# Patient Record
Sex: Female | Born: 1980 | Race: White | Hispanic: No | Marital: Married | Smoking: Never smoker
Health system: Southern US, Community
[De-identification: ages and names within clinical notes are randomized; demographics above are authoritative.]

## PROBLEM LIST (undated history)

## (undated) ENCOUNTER — Inpatient Hospital Stay (HOSPITAL_COMMUNITY): Payer: Self-pay

## (undated) DIAGNOSIS — Z87442 Personal history of urinary calculi: Secondary | ICD-10-CM

## (undated) DIAGNOSIS — F32A Depression, unspecified: Secondary | ICD-10-CM

## (undated) DIAGNOSIS — F419 Anxiety disorder, unspecified: Secondary | ICD-10-CM

## (undated) DIAGNOSIS — I10 Essential (primary) hypertension: Secondary | ICD-10-CM

## (undated) DIAGNOSIS — E785 Hyperlipidemia, unspecified: Secondary | ICD-10-CM

## (undated) DIAGNOSIS — F329 Major depressive disorder, single episode, unspecified: Secondary | ICD-10-CM

## (undated) HISTORY — PX: WISDOM TOOTH EXTRACTION: SHX21

## (undated) HISTORY — DX: Hyperlipidemia, unspecified: E78.5

## (undated) HISTORY — PX: ABDOMINAL HYSTERECTOMY: SHX81

## (undated) HISTORY — DX: Major depressive disorder, single episode, unspecified: F32.9

## (undated) HISTORY — DX: Depression, unspecified: F32.A

## (undated) HISTORY — PX: COLONOSCOPY: SHX174

## (undated) HISTORY — PX: KNEE ARTHROSCOPY: SUR90

## (undated) HISTORY — DX: Anxiety disorder, unspecified: F41.9

## (undated) HISTORY — PX: TUBAL LIGATION: SHX77

## (undated) HISTORY — PX: UPPER GI ENDOSCOPY: SHX6162

## (undated) HISTORY — PX: COLPOSCOPY VULVA W/ BIOPSY: SUR282

---

## 2008-04-14 HISTORY — PX: CHOLECYSTECTOMY: SHX55

## 2012-05-10 ENCOUNTER — Ambulatory Visit: Payer: Managed Care, Other (non HMO) | Admitting: Gynecology

## 2012-05-10 VITALS — BP 113/84 | Wt 169.0 lb

## 2012-05-10 DIAGNOSIS — R110 Nausea: Secondary | ICD-10-CM

## 2012-05-10 DIAGNOSIS — Z3480 Encounter for supervision of other normal pregnancy, unspecified trimester: Secondary | ICD-10-CM

## 2012-05-10 LAB — POCT URINE PREGNANCY: Preg Test, Ur: POSITIVE

## 2012-05-10 MED ORDER — PROMETHAZINE HCL 25 MG PO TABS: 25.0000 mg | ORAL_TABLET | Freq: Four times a day (QID) | ORAL | Status: DC | PRN

## 2012-05-10 NOTE — Patient Instructions (Addendum)
Patient was seen today for prenatal work -up as a Engineer, civil (consulting) visit. Blood work was drawn and patient medical history was taken. Patient also request to have integrated screening done. This will also discuss further at her next appointment with the doctor on 05/18/12.Patient also request medication sent to her pharmacy for nausea. New Patient package was given and patient doing well. Advise patient if have any further question before appointment she would call office.

## 2012-05-11 LAB — HIV ANTIBODY (ROUTINE TESTING W REFLEX): HIV: NONREACTIVE

## 2012-05-12 LAB — OBSTETRIC PANEL
Basophils Absolute: 0 10*3/uL (ref 0.0–0.1)
Eosinophils Absolute: 0 10*3/uL (ref 0.0–0.7)
Eosinophils Relative: 0 % (ref 0–5)
Hepatitis B Surface Ag: NEGATIVE
Lymphocytes Relative: 35 % (ref 12–46)
Lymphs Abs: 2.3 10*3/uL (ref 0.7–4.0)
MCH: 28 pg (ref 26.0–34.0)
MCV: 82.5 fL (ref 78.0–100.0)
Neutrophils Relative %: 55 % (ref 43–77)
Platelets: 276 10*3/uL (ref 150–400)
RBC: 4.75 MIL/uL (ref 3.87–5.11)
RDW: 13.4 % (ref 11.5–15.5)
WBC: 6.4 10*3/uL (ref 4.0–10.5)

## 2012-05-13 ENCOUNTER — Encounter: Payer: Self-pay | Admitting: Obstetrics and Gynecology

## 2012-05-13 ENCOUNTER — Encounter: Payer: Self-pay | Admitting: Family Medicine

## 2012-05-13 ENCOUNTER — Ambulatory Visit (INDEPENDENT_AMBULATORY_CARE_PROVIDER_SITE_OTHER): Payer: Private Health Insurance - Indemnity | Admitting: Obstetrics and Gynecology

## 2012-05-13 VITALS — BP 128/86 | Ht 63.0 in | Wt 170.0 lb

## 2012-05-13 DIAGNOSIS — N390 Urinary tract infection, site not specified: Secondary | ICD-10-CM

## 2012-05-13 DIAGNOSIS — B951 Streptococcus, group B, as the cause of diseases classified elsewhere: Secondary | ICD-10-CM

## 2012-05-13 DIAGNOSIS — O2340 Unspecified infection of urinary tract in pregnancy, unspecified trimester: Secondary | ICD-10-CM

## 2012-05-13 DIAGNOSIS — O2390 Unspecified genitourinary tract infection in pregnancy, unspecified trimester: Secondary | ICD-10-CM

## 2012-05-13 DIAGNOSIS — Z3480 Encounter for supervision of other normal pregnancy, unspecified trimester: Secondary | ICD-10-CM

## 2012-05-13 DIAGNOSIS — O09299 Supervision of pregnancy with other poor reproductive or obstetric history, unspecified trimester: Secondary | ICD-10-CM

## 2012-05-13 DIAGNOSIS — Z98891 History of uterine scar from previous surgery: Secondary | ICD-10-CM

## 2012-05-13 DIAGNOSIS — Z9889 Other specified postprocedural states: Secondary | ICD-10-CM

## 2012-05-13 LAB — CULTURE, URINE COMPREHENSIVE: Colony Count: 40000

## 2012-05-13 MED ORDER — PENICILLIN V POTASSIUM 500 MG PO TABS: 500.0000 mg | ORAL_TABLET | Freq: Four times a day (QID) | ORAL | Status: DC

## 2012-05-13 MED ORDER — ONDANSETRON 4 MG PO TBDP: 4.0000 mg | ORAL_TABLET | Freq: Four times a day (QID) | ORAL | Status: DC | PRN

## 2012-05-13 NOTE — Patient Instructions (Signed)
Pregnancy - First Trimester During sexual intercourse, millions of sperm go into the vagina. Only 1 sperm will penetrate and fertilize the female egg while it is in the Fallopian tube. One week later, the fertilized egg implants into the wall of the uterus. An embryo begins to develop into a baby. At 6 to 8 weeks, the eyes and face are formed and the heartbeat can be seen on ultrasound. At the end of 12 weeks (first trimester), all the baby's organs are formed. Now that you are pregnant, you will want to do everything you can to have a healthy baby. Two of the most important things are to get good prenatal care and follow your caregiver's instructions. Prenatal care is all the medical care you receive before the baby's birth. It is given to prevent, find, and treat problems during the pregnancy and childbirth. PRENATAL EXAMS  During prenatal visits, your weight, blood pressure and urine are checked. This is done to make sure you are healthy and progressing normally during the pregnancy.  A pregnant woman should gain 25 to 35 pounds during the pregnancy. However, if you are over weight or underweight, your caregiver will advise you regarding your weight.  Your caregiver will ask and answer questions for you.  Blood work, cervical cultures, other necessary tests and a Pap test are done during your prenatal exams. These tests are done to check on your health and the probable health of your baby. Tests are strongly recommended and done for HIV with your permission. This is the virus that causes AIDS. These tests are done because medications can be given to help prevent your baby from being born with this infection should you have been infected without knowing it. Blood work is also used to find out your blood type, previous infections and follow your blood levels (hemoglobin).  Low hemoglobin (anemia) is common during pregnancy. Iron and vitamins are given to help prevent this. Later in the pregnancy,  blood tests for diabetes will be done along with any other tests if any problems develop. You may need tests to make sure you and the baby are doing well.  You may need other tests to make sure you and the baby are doing well. CHANGES DURING THE FIRST TRIMESTER (THE FIRST 3 MONTHS OF PREGNANCY) Your body goes through many changes during pregnancy. They vary from person to person. Talk to your caregiver about changes you notice and are concerned about. Changes can include:  Your menstrual period stops.  The egg and sperm carry the genes that determine what you look like. Genes from you and your partner are forming a baby. The female genes determine whether the baby is a boy or a girl.  Your body increases in girth and you may feel bloated.  Feeling sick to your stomach (nauseous) and throwing up (vomiting). If the vomiting is uncontrollable, call your caregiver.  Your breasts will begin to enlarge and become tender.  Your nipples may stick out more and become darker.  The need to urinate more. Painful urination may mean you have a bladder infection.  Tiring easily.  Loss of appetite.  Cravings for certain kinds of food.  At first, you may gain or lose a couple of pounds.  You may have changes in your emotions from day to day (excited to be pregnant or concerned something may go wrong with the pregnancy and baby).  You may have more vivid and strange dreams. HOME CARE INSTRUCTIONS   It is very important   to avoid all smoking, alcohol and un-prescribed drugs during your pregnancy. These affect the formation and growth of the baby. Avoid chemicals while pregnant to ensure the delivery of a healthy infant.  Start your prenatal visits by the 12th week of pregnancy. They are usually scheduled monthly at first, then more often in the last 2 months before delivery. Keep your caregiver's appointments. Follow your caregiver's instructions regarding medication use, blood and lab tests, exercise,  and diet.  During pregnancy, you are providing food for you and your baby. Eat regular, well-balanced meals. Choose foods such as meat, fish, milk and other low fat dairy products, vegetables, fruits, and whole-grain breads and cereals. Your caregiver will tell you of the ideal weight gain.  You can help morning sickness by keeping soda crackers at the bedside. Eat a couple before arising in the morning. You may want to use the crackers without salt on them.  Eating 4 to 5 small meals rather than 3 large meals a day also may help the nausea and vomiting.  Drinking liquids between meals instead of during meals also seems to help nausea and vomiting.  A physical sexual relationship may be continued throughout pregnancy if there are no other problems. Problems may be early (premature) leaking of amniotic fluid from the membranes, vaginal bleeding, or belly (abdominal) pain.  Exercise regularly if there are no restrictions. Check with your caregiver or physical therapist if you are unsure of the safety of some of your exercises. Greater weight gain will occur in the last 2 trimesters of pregnancy. Exercising will help:  Control your weight.  Keep you in shape.  Prepare you for labor and delivery.  Help you lose your pregnancy weight after you deliver your baby.  Wear a good support or jogging bra for breast tenderness during pregnancy. This may help if worn during sleep too.  Ask when prenatal classes are available. Begin classes when they are offered.  Do not use hot tubs, steam rooms or saunas.  Wear your seat belt when driving. This protects you and your baby if you are in an accident.  Avoid raw meat, uncooked cheese, cat litter boxes and soil used by cats throughout the pregnancy. These carry germs that can cause birth defects in the baby.  The first trimester is a good time to visit your dentist for your dental health. Getting your teeth cleaned is OK. Use a softer toothbrush and  brush gently during pregnancy.  Ask for help if you have financial, counseling or nutritional needs during pregnancy. Your caregiver will be able to offer counseling for these needs as well as refer you for other special needs.  Do not take any medications or herbs unless told by your caregiver.  Inform your caregiver if there is any mental or physical domestic violence.  Make a list of emergency phone numbers of family, friends, hospital, and police and fire departments.  Write down your questions. Take them to your prenatal visit.  Do not douche.  Do not cross your legs.  If you have to stand for long periods of time, rotate you feet or take small steps in a circle.  You may have more vaginal secretions that may require a sanitary pad. Do not use tampons or scented sanitary pads. MEDICATIONS AND DRUG USE IN PREGNANCY  Take prenatal vitamins as directed. The vitamin should contain 1 milligram of folic acid. Keep all vitamins out of reach of children. Only a couple vitamins or tablets containing iron may be   fatal to a baby or young child when ingested.  Avoid use of all medications, including herbs, over-the-counter medications, not prescribed or suggested by your caregiver. Only take over-the-counter or prescription medicines for pain, discomfort, or fever as directed by your caregiver. Do not use aspirin, ibuprofen, or naproxen unless directed by your caregiver.  Let your caregiver also know about herbs you may be using.  Alcohol is related to a number of birth defects. This includes fetal alcohol syndrome. All alcohol, in any form, should be avoided completely. Smoking will cause low birth rate and premature babies.  Street or illegal drugs are very harmful to the baby. They are absolutely forbidden. A baby born to an addicted mother will be addicted at birth. The baby will go through the same withdrawal an adult does.  Let your caregiver know about any medications that you have to  take and for what reason you take them. MISCARRIAGE IS COMMON DURING PREGNANCY A miscarriage does not mean you did something wrong. It is not a reason to worry about getting pregnant again. Your caregiver will help you with questions you may have. If you have a miscarriage, you may need minor surgery. SEEK MEDICAL CARE IF:  You have any concerns or worries during your pregnancy. It is better to call with your questions if you feel they cannot wait, rather than worry about them. SEEK IMMEDIATE MEDICAL CARE IF:   An unexplained oral temperature above 100.4 F (38 C) develops, or as your caregiver suggests.  You have leaking of fluid from the vagina (birth canal). If leaking membranes are suspected, take your temperature and inform your caregiver of this when you call.  There is vaginal spotting or bleeding. Notify your caregiver of the amount and how many pads are used.  You develop a bad smelling vaginal discharge with a change in the color.  You continue to feel sick to your stomach (nauseated) and have no relief from remedies suggested. You vomit blood or coffee ground-like materials.  You lose more than 2 pounds of weight in 1 week.  You gain more than 2 pounds of weight in 1 week and you notice swelling of your face, hands, feet, or legs.  You gain 5 pounds or more in 1 week (even if you do not have swelling of your hands, face, legs, or feet).  You get exposed to German measles and have never had them.  You are exposed to fifth disease or chickenpox.  You develop belly (abdominal) pain. Round ligament discomfort is a common non-cancerous (benign) cause of abdominal pain in pregnancy. Your caregiver still must evaluate this.  You develop headache, fever, diarrhea, pain with urination, or shortness of breath.  You fall or are in a car accident or have any kind of trauma.  There is mental or physical violence in your home. Document Released: 03/25/2001 Document Revised: 06/23/2011  Document Reviewed: 09/26/2008 [employer] Patient Information 2013 [employer].  

## 2012-05-13 NOTE — Progress Notes (Signed)
Last pap done in March of 2013, had implanon removed in April.

## 2012-05-13 NOTE — Addendum Note (Signed)
Addended by: Barbara Cower on: 05/13/2012 11:55 AM   Modules accepted: Orders

## 2012-05-13 NOTE — Progress Notes (Signed)
   Subjective:    Alisha Hughes is a G2P0101 [redacted]w[redacted]d being seen today for her first obstetrical visit.  Her obstetrical history is significant for group B strep colonizer and pre-eclampsia. Patient  Had a 4-day induction due to pre-clampsia which resulted in a c-section secondary to failed induction. Patient does intend to breast feed. Pregnancy history fully reviewed.  Patient reports nausea.  Filed Vitals:   05/13/12 1000 05/13/12 1004  BP: 128/86   Height:  5\' 3"  (1.6 m)  Weight: 170 lb (77.111 kg)     HISTORY: OB History    Grav Para Term Preterm Abortions TAB SAB Ect Mult Living   2 1 0 1      1     # Outc Date GA Lbr Len/2nd Wgt Sex Del Anes PTL Lv   1 PRE 8/11 [redacted]w[redacted]d   F LTCS EPI  Yes   2 CUR              Past Medical History  Diagnosis Date  . Anxiety   . Pregnancy induced hypertension   . Abnormal Pap smear   . Depression   . Preterm labor   . Headache     migranes.  . Ovarian cyst    Past Surgical History  Procedure Date  . Colposcopy vulva w/ biopsy     abnormal pap  . Cesarean section   . Colonoscopy     2009  . Cholecystectomy     2009  . Wisdom tooth extraction     x4   Family History  Problem Relation Age of Onset  . Hypertension Mother   . Heart disease Father   . Hypertension Father   . Hyperlipidemia Father   . Depression Brother   . Multiple sclerosis Maternal Grandmother   . Heart disease Maternal Grandfather   . Heart disease Paternal Grandmother   . Parkinson's disease Paternal Grandfather      Exam    Uterus:     Pelvic Exam:    Perineum: Normal Perineum   Vulva: normal   Vagina:  normal mucosa, normal discharge   pH:    Cervix: closed and long   Adnexa: no mass, fullness, tenderness   Bony Pelvis: android  System: Breast:  normal appearance, no masses or tenderness   Skin: normal coloration and turgor, no rashes    Neurologic: oriented, grossly non-focal   Extremities: normal strength, tone, and muscle mass, no erythema,  induration, or nodules   HEENT extra ocular movement intact   Mouth/Teeth mucous membranes moist, pharynx normal without lesions and dental hygiene good   Neck supple and no masses   Cardiovascular: regular rate and rhythm   Respiratory:  chest clear, no wheezing, crepitations, rhonchi, normal symmetric air entry   Abdomen: soft, non-tender; bowel sounds normal; no masses,  no organomegaly   Urinary:       Assessment:    Pregnancy: [id number] Patient Active Problem List  Diagnosis  . Supervision of other normal pregnancy  . GBS (group B streptococcus) UTI complicating pregnancy        Plan:     Initial labs drawn. Prenatal vitamins. Problem list reviewed and updated. Genetic Screening discussed First Screen: requested.  Ultrasound discussed; fetal survey: requested. Reviewed healthy eating and exercise during pregnancy  Follow up in 4 weeks. 50% of 30 min visit spent on counseling and coordination of care.     Peter Daquila 05/13/2012

## 2012-05-14 ENCOUNTER — Encounter: Payer: Self-pay | Admitting: Obstetrics & Gynecology

## 2012-05-14 LAB — GC/CHLAMYDIA PROBE AMP, GENITAL
Chlamydia, DNA Probe: NEGATIVE
GC Probe Amp, Genital: NEGATIVE

## 2012-05-18 ENCOUNTER — Encounter: Payer: Self-pay | Admitting: Obstetrics & Gynecology

## 2012-05-28 ENCOUNTER — Emergency Department: Payer: Self-pay | Admitting: Emergency Medicine

## 2012-05-28 LAB — CBC
HCT: 39.8 % (ref 35.0–47.0)
HGB: 13.3 g/dL (ref 12.0–16.0)
MCH: 28.2 pg (ref 26.0–34.0)
MCV: 84 fL (ref 80–100)
RBC: 4.72 10*6/uL (ref 3.80–5.20)
RDW: 13.8 % (ref 11.5–14.5)
WBC: 9.2 10*3/uL (ref 3.6–11.0)

## 2012-05-28 LAB — COMPREHENSIVE METABOLIC PANEL
Alkaline Phosphatase: 82 U/L (ref 50–136)
Anion Gap: 7 (ref 7–16)
Bilirubin,Total: 0.2 mg/dL (ref 0.2–1.0)
Creatinine: 0.45 mg/dL — ABNORMAL LOW (ref 0.60–1.30)
EGFR (African American): >60
EGFR (Non-African Amer.): >60
Potassium: 3.7 mmol/L (ref 3.5–5.1)
Sodium: 137 mmol/L (ref 136–145)
Total Protein: 7.5 g/dL (ref 6.4–8.2)

## 2012-05-28 LAB — HCG, QUANTITATIVE, PREGNANCY: Beta Hcg, Quant.: 101174 m[IU]/mL — ABNORMAL HIGH

## 2012-05-28 LAB — URINALYSIS, COMPLETE
Glucose,UR: NEGATIVE mg/dL (ref 0–75)
Specific Gravity: 1.015 (ref 1.003–1.030)

## 2012-05-28 LAB — LIPASE, BLOOD: Lipase: 146 U/L (ref 73–393)

## 2012-05-29 LAB — WET PREP, GENITAL

## 2012-05-30 LAB — URINE CULTURE

## 2012-05-31 ENCOUNTER — Ambulatory Visit (HOSPITAL_COMMUNITY): Payer: Private Health Insurance - Indemnity

## 2012-06-08 ENCOUNTER — Ambulatory Visit (INDEPENDENT_AMBULATORY_CARE_PROVIDER_SITE_OTHER): Payer: Private Health Insurance - Indemnity | Admitting: Family Medicine

## 2012-06-08 VITALS — BP 122/89 | Wt 171.0 lb

## 2012-06-08 DIAGNOSIS — Z9889 Other specified postprocedural states: Secondary | ICD-10-CM

## 2012-06-08 DIAGNOSIS — O26899 Other specified pregnancy related conditions, unspecified trimester: Secondary | ICD-10-CM

## 2012-06-08 DIAGNOSIS — R300 Dysuria: Secondary | ICD-10-CM

## 2012-06-08 DIAGNOSIS — O26891 Other specified pregnancy related conditions, first trimester: Secondary | ICD-10-CM

## 2012-06-08 DIAGNOSIS — Z98891 History of uterine scar from previous surgery: Secondary | ICD-10-CM

## 2012-06-08 DIAGNOSIS — Z3480 Encounter for supervision of other normal pregnancy, unspecified trimester: Secondary | ICD-10-CM

## 2012-06-08 LAB — POCT URINALYSIS DIPSTICK
Leukocytes, UA: NEGATIVE
Protein, UA: NEGATIVE
Spec Grav, UA: >=1.03
pH, UA: 5

## 2012-06-08 MED ORDER — CEPHALEXIN 500 MG PO CAPS: 500.0000 mg | ORAL_CAPSULE | Freq: Three times a day (TID) | ORAL | Status: DC

## 2012-06-08 NOTE — Patient Instructions (Addendum)
Dysuria Dysuria is the medical term for pain with urination. There are many causes for dysuria, but urinary tract infection is the most common. If a urinalysis was performed it can show that there is a urinary tract infection. A urine culture confirms that you or your child is sick. You will need to follow up with a healthcare provider because:  If a urine culture was done you will need to know the culture results and treatment recommendations.  If the urine culture was positive, you or your child will need to be put on antibiotics or know if the antibiotics prescribed are the right antibiotics for your urinary tract infection.  If the urine culture is negative (no urinary tract infection), then other causes may need to be explored or antibiotics need to be stopped. Today laboratory work may have been done and there does not seem to be an infection. If cultures were done they will take at least 24 to 48 hours to be completed. Today x-rays may have been taken and they read as normal. No cause can be found for the problems. The x-rays may be re-read by a radiologist and you will be contacted if additional findings are made. You or your child may have been put on medications to help with this problem until you can see your primary caregiver. If the problems get better, see your primary caregiver if the problems return. If you were given antibiotics (medications which kill germs), take all of the mediations as directed for the full course of treatment.  If laboratory work was done, you need to find the results. Leave a telephone number where you can be reached. If this is not possible, make sure you find out how you are to get test results. HOME CARE INSTRUCTIONS   Drink lots of fluids. For adults, drink eight, 8 ounce glasses of clear juice or water a day. For children, replace fluids as suggested by your caregiver.  Empty the bladder often. Avoid holding urine for long periods of time.  After a bowel  movement, women should cleanse front to back, using each tissue only once.  Empty your bladder before and after sexual intercourse.  Take all the medicine given to you until it is gone. You may feel better in a few days, but TAKE ALL MEDICINE.  Avoid caffeine, tea, alcohol and carbonated beverages, because they tend to irritate the bladder.  In men, alcohol may irritate the prostate.  Only take over-the-counter or prescription medicines for pain, discomfort, or fever as directed by your caregiver.  If your caregiver has given you a follow-up appointment, it is very important to keep that appointment. Not keeping the appointment could result in a chronic or permanent injury, pain, and disability. If there is any problem keeping the appointment, you must call back to this facility for assistance. SEEK IMMEDIATE MEDICAL CARE IF:   Back pain develops.  A fever develops.  There is nausea (feeling sick to your stomach) or vomiting (throwing up).  Problems are no better with medications or are getting worse. MAKE SURE YOU:   Understand these instructions.  Will watch your condition.  Will get help right away if you are not doing well or get worse. Document Released: 12/28/2003 Document Revised: 06/23/2011 Document Reviewed: 11/04/2007 [facility] Patient Information 2013 [place], [place].  Pregnancy - First Trimester During sexual intercourse, millions of sperm go into the vagina. Only 1 sperm will penetrate and fertilize the female egg while it is in the Fallopian tube. One  week later, the fertilized egg implants into the wall of the uterus. An embryo begins to develop into a baby. At 6 to 8 weeks, the eyes and face are formed and the heartbeat can be seen on ultrasound. At the end of 12 weeks (first trimester), all the baby's organs are formed. Now that you are pregnant, you will want to do everything you can to have a healthy baby. Two of the most important things are to get good prenatal  care and follow your caregiver's instructions. Prenatal care is all the medical care you receive before the baby's birth. It is given to prevent, find, and treat problems during the pregnancy and childbirth. PRENATAL EXAMS  During prenatal visits, your weight, blood pressure and urine are checked. This is done to make sure you are healthy and progressing normally during the pregnancy.  A pregnant woman should gain 25 to 35 pounds during the pregnancy. However, if you are over weight or underweight, your caregiver will advise you regarding your weight.  Your caregiver will ask and answer questions for you.  Blood work, cervical cultures, other necessary tests and a Pap test are done during your prenatal exams. These tests are done to check on your health and the probable health of your baby. Tests are strongly recommended and done for HIV with your permission. This is the virus that causes AIDS. These tests are done because medications can be given to help prevent your baby from being born with this infection should you have been infected without knowing it. Blood work is also used to find out your blood type, previous infections and follow your blood levels (hemoglobin).  Low hemoglobin (anemia) is common during pregnancy. Iron and vitamins are given to help prevent this. Later in the pregnancy, blood tests for diabetes will be done along with any other tests if any problems develop. You may need tests to make sure you and the baby are doing well.  You may need other tests to make sure you and the baby are doing well. CHANGES DURING THE FIRST TRIMESTER (THE FIRST 3 MONTHS OF PREGNANCY) Your body goes through many changes during pregnancy. They vary from person to person. Talk to your caregiver about changes you notice and are concerned about. Changes can include:  Your menstrual period stops.  The egg and sperm carry the genes that determine what you look like. Genes from you and your partner are  forming a baby. The female genes determine whether the baby is a boy or a girl.  Your body increases in girth and you may feel bloated.  Feeling sick to your stomach (nauseous) and throwing up (vomiting). If the vomiting is uncontrollable, call your caregiver.  Your breasts will begin to enlarge and become tender.  Your nipples may stick out more and become darker.  The need to urinate more. Painful urination may mean you have a bladder infection.  Tiring easily.  Loss of appetite.  Cravings for certain kinds of food.  At first, you may gain or lose a couple of pounds.  You may have changes in your emotions from day to day (excited to be pregnant or concerned something may go wrong with the pregnancy and baby).  You may have more vivid and strange dreams. HOME CARE INSTRUCTIONS   It is very important to avoid all smoking, alcohol and un-prescribed drugs during your pregnancy. These affect the formation and growth of the baby. Avoid chemicals while pregnant to ensure the delivery of a healthy  infant.  Start your prenatal visits by the 12th week of pregnancy. They are usually scheduled monthly at first, then more often in the last 2 months before delivery. Keep your caregiver's appointments. Follow your caregiver's instructions regarding medication use, blood and lab tests, exercise, and diet.  During pregnancy, you are providing food for you and your baby. Eat regular, well-balanced meals. Choose foods such as meat, fish, milk and other low fat dairy products, vegetables, fruits, and whole-grain breads and cereals. Your caregiver will tell you of the ideal weight gain.  You can help morning sickness by keeping soda crackers at the bedside. Eat a couple before arising in the morning. You may want to use the crackers without salt on them.  Eating 4 to 5 small meals rather than 3 large meals a day also may help the nausea and vomiting.  Drinking liquids between meals instead of during  meals also seems to help nausea and vomiting.  A physical sexual relationship may be continued throughout pregnancy if there are no other problems. Problems may be early (premature) leaking of amniotic fluid from the membranes, vaginal bleeding, or belly (abdominal) pain.  Exercise regularly if there are no restrictions. Check with your caregiver or physical therapist if you are unsure of the safety of some of your exercises. Greater weight gain will occur in the last 2 trimesters of pregnancy. Exercising will help:  Control your weight.  Keep you in shape.  Prepare you for labor and delivery.  Help you lose your pregnancy weight after you deliver your baby.  Wear a good support or jogging bra for breast tenderness during pregnancy. This may help if worn during sleep too.  Ask when prenatal classes are available. Begin classes when they are offered.  Do not use hot tubs, steam rooms or saunas.  Wear your seat belt when driving. This protects you and your baby if you are in an accident.  Avoid raw meat, uncooked cheese, cat litter boxes and soil used by cats throughout the pregnancy. These carry germs that can cause birth defects in the baby.  The first trimester is a good time to visit your dentist for your dental health. Getting your teeth cleaned is OK. Use a softer toothbrush and brush gently during pregnancy.  Ask for help if you have financial, counseling or nutritional needs during pregnancy. Your caregiver will be able to offer counseling for these needs as well as refer you for other special needs.  Do not take any medications or herbs unless told by your caregiver.  Inform your caregiver if there is any mental or physical domestic violence.  Make a list of emergency phone numbers of family, friends, hospital, and police and fire departments.  Write down your questions. Take them to your prenatal visit.  Do not douche.  Do not cross your legs.  If you have to stand for  long periods of time, rotate you feet or take small steps in a circle.  You may have more vaginal secretions that may require a sanitary pad. Do not use tampons or scented sanitary pads. MEDICATIONS AND DRUG USE IN PREGNANCY  Take prenatal vitamins as directed. The vitamin should contain 1 milligram of folic acid. Keep all vitamins out of reach of children. Only a couple vitamins or tablets containing iron may be fatal to a baby or young child when ingested.  Avoid use of all medications, including herbs, over-the-counter medications, not prescribed or suggested by your caregiver. Only take over-the-counter or prescription  medicines for pain, discomfort, or fever as directed by your caregiver. Do not use aspirin, ibuprofen, or naproxen unless directed by your caregiver.  Let your caregiver also know about herbs you may be using.  Alcohol is related to a number of birth defects. This includes fetal alcohol syndrome. All alcohol, in any form, should be avoided completely. Smoking will cause low birth rate and premature babies.  Street or illegal drugs are very harmful to the baby. They are absolutely forbidden. A baby born to an addicted mother will be addicted at birth. The baby will go through the same withdrawal an adult does.  Let your caregiver know about any medications that you have to take and for what reason you take them. MISCARRIAGE IS COMMON DURING PREGNANCY A miscarriage does not mean you did something wrong. It is not a reason to worry about getting pregnant again. Your caregiver will help you with questions you may have. If you have a miscarriage, you may need minor surgery. SEEK MEDICAL CARE IF:  You have any concerns or worries during your pregnancy. It is better to call with your questions if you feel they cannot wait, rather than worry about them. SEEK IMMEDIATE MEDICAL CARE IF:   An unexplained oral temperature above 102 F (38.9 C) develops, or as your caregiver  suggests.  You have leaking of fluid from the vagina (birth canal). If leaking membranes are suspected, take your temperature and inform your caregiver of this when you call.  There is vaginal spotting or bleeding. Notify your caregiver of the amount and how many pads are used.  You develop a bad smelling vaginal discharge with a change in the color.  You continue to feel sick to your stomach (nauseated) and have no relief from remedies suggested. You vomit blood or coffee ground-like materials.  You lose more than 2 pounds of weight in 1 week.  You gain more than 2 pounds of weight in 1 week and you notice swelling of your face, hands, feet, or legs.  You gain 5 pounds or more in 1 week (even if you do not have swelling of your hands, face, legs, or feet).  You get exposed to [place] measles and have never had them.  You are exposed to fifth disease or chickenpox.  You develop belly (abdominal) pain. Round ligament discomfort is a common non-cancerous (benign) cause of abdominal pain in pregnancy. Your caregiver still must evaluate this.  You develop headache, fever, diarrhea, pain with urination, or shortness of breath.  You fall or are in a car accident or have any kind of trauma.  There is mental or physical violence in your home. Document Released: 03/25/2001 Document Revised: 06/23/2011 Document Reviewed: 09/26/2008 [facility] Patient Information 2013 [place], [place].  Breastfeeding Deciding to breastfeed is one of the best choices you can make for you and your baby. The information that follows gives a brief overview of the benefits of breastfeeding as well as common topics surrounding breastfeeding. BENEFITS OF BREASTFEEDING For the baby  The first milk (colostrum) helps the baby's digestive system function better.   There are antibodies in the mother's milk that help the baby fight off infections.   The baby has a lower incidence of asthma, allergies, and sudden infant  death syndrome (SIDS).   The nutrients in breast milk are better for the baby than infant formulas, and breast milk helps the baby's brain grow better.   Babies who breastfeed have less gas, colic, and constipation.  For the  mother  Breastfeeding helps develop a very special bond between the mother and her baby.   Breastfeeding is convenient, always available at the correct temperature, and costs nothing.   Breastfeeding burns calories in the mother and helps her lose weight that was gained during pregnancy.   Breastfeeding makes the uterus contract back down to normal size faster and slows bleeding following delivery.   Breastfeeding mothers have a lower risk of developing breast cancer.  BREASTFEEDING FREQUENCY  A healthy, full-term baby may breastfeed as often as every hour or space his or her feedings to every 3 hours.   Watch your baby for signs of hunger. Nurse your baby if he or she shows signs of hunger. How often you nurse will vary from baby to baby.   Nurse as often as the baby requests, or when you feel the need to reduce the fullness of your breasts.   Awaken the baby if it has been 3 4 hours since the last feeding.   Frequent feeding will help the mother make more milk and will help prevent problems, such as sore nipples and engorgement of the breasts.  BABY'S POSITION AT THE BREAST  Whether lying down or sitting, be sure that the baby's tummy is facing your tummy.   Support the breast with 4 fingers underneath the breast and the thumb above. Make sure your fingers are well away from the nipple and baby's mouth.   Stroke the baby's lips gently with your finger or nipple.   When the baby's mouth is open wide enough, place all of your nipple and as much of the areola as possible into your baby's mouth.   Pull the baby in close so the tip of the nose and the baby's cheeks touch the breast during the feeding.  FEEDINGS AND SUCTION  The length of  each feeding varies from baby to baby and from feeding to feeding.   The baby must suck about 2 3 minutes for your milk to get to him or her. This is called a "let down." For this reason, allow the baby to feed on each breast as long as he or she wants. Your baby will end the feeding when he or she has received the right balance of nutrients.   To break the suction, put your finger into the corner of the baby's mouth and slide it between his or her gums before removing your breast from his or her mouth. This will help prevent sore nipples.  HOW TO TELL WHETHER YOUR BABY IS GETTING ENOUGH BREAST MILK. Wondering whether or not your baby is getting enough milk is a common concern among mothers. You can be assured that your baby is getting enough milk if:   Your baby is actively sucking and you hear swallowing.   Your baby seems relaxed and satisfied after a feeding.   Your baby nurses at least 8 12 times in a 24 hour time period. Nurse your baby until he or she unlatches or falls asleep at the first breast (at least 10 20 minutes), then offer the second side.   Your baby is wetting 5 6 disposable diapers (6 8 cloth diapers) in a 24 hour period by 62 71 days of age.   Your baby is having at least 3 4 stools every 24 hours for the first 6 weeks. The stool should be soft and yellow.   Your baby should gain 4 7 ounces per week after he or she is 80 days old.  Your breasts feel softer after nursing.  REDUCING BREAST ENGORGEMENT  In the first week after your baby is born, you may experience signs of breast engorgement. When breasts are engorged, they feel heavy, warm, full, and may be tender to the touch. You can reduce engorgement if you:   Nurse frequently, every 2 3 hours. Mothers who breastfeed early and often have fewer problems with engorgement.   Place light ice packs on your breasts for 10 20 minutes between feedings. This reduces swelling. Wrap the ice packs in a lightweight  towel to protect your skin. Bags of frozen vegetables work well for this purpose.   Take a warm shower or apply warm, moist heat to your breast for 5 10 minutes just before each feeding. This increases circulation and helps the milk flow.   Gently massage your breast before and during the feeding. Using your finger tips, massage from the chest wall towards your nipple in a circular motion.   Make sure that the baby empties at least one breast at every feeding before switching sides.   Use a breast pump to empty the breasts if your baby is sleepy or not nursing well. You may also want to pump if you are returning to work oryou feel you are getting engorged.   Avoid bottle feeds, pacifiers, or supplemental feedings of water or juice in place of breastfeeding. Breast milk is all the food your baby needs. It is not necessary for your baby to have water or formula. In fact, to help your breasts make more milk, it is best not to give your baby supplemental feedings during the early weeks.   Be sure the baby is latched on and positioned properly while breastfeeding.   Wear a supportive bra, avoiding underwire styles.   Eat a balanced diet with enough fluids.   Rest often, relax, and take your prenatal vitamins to prevent fatigue, stress, and anemia.  If you follow these suggestions, your engorgement should improve in 24 48 hours. If you are still experiencing difficulty, call your lactation consultant or caregiver.  CARING FOR YOURSELF Take care of your breasts  Bathe or shower daily.   Avoid using soap on your nipples.   Start feedings on your left breast at one feeding and on your right breast at the next feeding.   You will notice an increase in your milk supply 2 5 days after delivery. You may feel some discomfort from engorgement, which makes your breasts very firm and often tender. Engorgement "peaks" out within 24 48 hours. In the meantime, apply warm moist towels to your  breasts for 5 10 minutes before feeding. Gentle massage and expression of some milk before feeding will soften your breasts, making it easier for your baby to latch on.   Wear a well-fitting nursing bra, and air dry your nipples for a 3 after each feeding.   Only use cotton bra pads.   Only use pure lanolin on your nipples after nursing. You do not need to wash it off before feeding the baby again. Another option is to express a few drops of breast milk and gently massage it into your nipples.  Take care of yourself  Eat well-balanced meals and nutritious snacks.   Drinking milk, fruit juice, and water to satisfy your thirst (about 8 glasses a day).   Get plenty of rest.  Avoid foods that you notice affect the baby in a bad way.  SEEK MEDICAL CARE IF:   You have  difficulty with breastfeeding and need help.   You have a hard, red, sore area on your breast that is accompanied by a fever.   Your baby is too sleepy to eat well or is having trouble sleeping.   Your baby is wetting less than 6 diapers a day, by 68 days of age.   Your baby's skin or white part of his or her eyes is more yellow than it was in the hospital.   You feel depressed.  Document Released: 03/31/2005 Document Revised: 09/30/2011 Document Reviewed: 06/29/2011 [facility] Patient Information 2013 [place], [place].

## 2012-06-08 NOTE — Progress Notes (Signed)
Mild dysuria, hesitancy--just treated for UTI with Macrobid--U/A shows more blood and leuks will re-treat. Now with diarrhea--using gatorade and water to help with dehydration

## 2012-06-08 NOTE — Progress Notes (Signed)
Having cramping x 2 weeks.  Went to [facility] on 05/28/12 and was treated for UTI and dehydration, did have cramping during this time but the cramping is continuing.

## 2012-06-10 ENCOUNTER — Encounter: Payer: Private Health Insurance - Indemnity | Admitting: Obstetrics & Gynecology

## 2012-06-19 ENCOUNTER — Encounter (HOSPITAL_COMMUNITY): Payer: Self-pay | Admitting: *Deleted

## 2012-06-19 ENCOUNTER — Emergency Department (HOSPITAL_COMMUNITY)
Admission: EM | Admit: 2012-06-19 | Discharge: 2012-06-19 | Discharge status: Against Medical Advice | Payer: Private Health Insurance - Indemnity | Attending: Emergency Medicine | Admitting: Emergency Medicine

## 2012-06-19 DIAGNOSIS — R05 Cough: Secondary | ICD-10-CM | POA: Insufficient documentation

## 2012-06-19 DIAGNOSIS — R197 Diarrhea, unspecified: Secondary | ICD-10-CM | POA: Insufficient documentation

## 2012-06-19 DIAGNOSIS — R059 Cough, unspecified: Secondary | ICD-10-CM | POA: Insufficient documentation

## 2012-06-19 DIAGNOSIS — O219 Vomiting of pregnancy, unspecified: Secondary | ICD-10-CM | POA: Insufficient documentation

## 2012-06-19 LAB — CBC WITH DIFFERENTIAL/PLATELET
Basophils Absolute: 0 10*3/uL (ref 0.0–0.1)
Basophils Relative: 0 % (ref 0–1)
Eosinophils Absolute: 0.1 10*3/uL (ref 0.0–0.7)
HCT: 38.4 % (ref 36.0–46.0)
Hemoglobin: 13.7 g/dL (ref 12.0–15.0)
Lymphs Abs: 2.5 10*3/uL (ref 0.7–4.0)
MCH: 29.1 pg (ref 26.0–34.0)
Monocytes Absolute: 1.3 10*3/uL — ABNORMAL HIGH (ref 0.1–1.0)
Monocytes Relative: 12 % (ref 3–12)
Neutro Abs: 6.3 10*3/uL (ref 1.7–7.7)
Neutrophils Relative %: 62 % (ref 43–77)
RBC: 4.71 MIL/uL (ref 3.87–5.11)

## 2012-06-19 LAB — COMPREHENSIVE METABOLIC PANEL
ALT: 8 U/L (ref 0–35)
AST: 15 U/L (ref 0–37)
Albumin: 3.2 g/dL — ABNORMAL LOW (ref 3.5–5.2)
CO2: 24 mEq/L (ref 19–32)
Calcium: 9.6 mg/dL (ref 8.4–10.5)
Creatinine, Ser: 0.49 mg/dL — ABNORMAL LOW (ref 0.50–1.10)
Sodium: 134 mEq/L — ABNORMAL LOW (ref 135–145)
Total Protein: 7.4 g/dL (ref 6.0–8.3)

## 2012-06-19 LAB — URINE MICROSCOPIC-ADD ON

## 2012-06-19 LAB — URINALYSIS, ROUTINE W REFLEX MICROSCOPIC
Bilirubin Urine: NEGATIVE
Glucose, UA: NEGATIVE mg/dL
Leukocytes, UA: NEGATIVE
Nitrite: NEGATIVE
Specific Gravity, Urine: 1.016 (ref 1.005–1.030)
pH: 6 (ref 5.0–8.0)

## 2012-06-19 NOTE — ED Notes (Addendum)
Pt states she is [redacted] weeks pregnant. Pt states flu like symptoms for the past couple of weeks. Pt states she was seen and treated for a UTI and given fluids due to dehydration. Pt states cough, N/V/D. Pt only able to take Robatussin due to being pregnant and was on 2 antibiotics for UTI. Pt states she has not been able to eat or drink for the past could of days. Pt states vaginal discharge as well but no blood or spotting. Pt took home zofran 2 hours ago.

## 2012-06-25 ENCOUNTER — Other Ambulatory Visit: Payer: Self-pay | Admitting: Obstetrics & Gynecology

## 2012-06-25 DIAGNOSIS — Z3682 Encounter for antenatal screening for nuchal translucency: Secondary | ICD-10-CM

## 2012-06-28 ENCOUNTER — Other Ambulatory Visit: Payer: Self-pay

## 2012-06-28 ENCOUNTER — Ambulatory Visit (HOSPITAL_COMMUNITY)
Admission: RE | Admit: 2012-06-28 | Discharge: 2012-06-28 | Disposition: A | Discharge status: Home / Self Care | Payer: Private Health Insurance - Indemnity | Source: Ambulatory Visit | Attending: Obstetrics & Gynecology | Admitting: Obstetrics & Gynecology

## 2012-06-28 ENCOUNTER — Encounter: Payer: Self-pay | Admitting: Obstetrics & Gynecology

## 2012-06-28 ENCOUNTER — Ambulatory Visit (HOSPITAL_COMMUNITY)
Admission: RE | Admit: 2012-06-28 | Discharge: 2012-06-28 | Disposition: A | Discharge status: Home / Self Care | Payer: Private Health Insurance - Indemnity | Source: Ambulatory Visit | Attending: Obstetrics and Gynecology | Admitting: Obstetrics and Gynecology

## 2012-06-28 VITALS — BP 119/80 | HR 106 | Wt 169.0 lb

## 2012-06-28 DIAGNOSIS — Z3682 Encounter for antenatal screening for nuchal translucency: Secondary | ICD-10-CM

## 2012-06-28 DIAGNOSIS — Z8751 Personal history of pre-term labor: Secondary | ICD-10-CM | POA: Insufficient documentation

## 2012-06-28 DIAGNOSIS — Z3689 Encounter for other specified antenatal screening: Secondary | ICD-10-CM | POA: Insufficient documentation

## 2012-06-28 DIAGNOSIS — O3510X0 Maternal care for (suspected) chromosomal abnormality in fetus, unspecified, not applicable or unspecified: Secondary | ICD-10-CM | POA: Insufficient documentation

## 2012-06-28 DIAGNOSIS — O34219 Maternal care for unspecified type scar from previous cesarean delivery: Secondary | ICD-10-CM | POA: Insufficient documentation

## 2012-06-28 DIAGNOSIS — O351XX0 Maternal care for (suspected) chromosomal abnormality in fetus, not applicable or unspecified: Secondary | ICD-10-CM | POA: Insufficient documentation

## 2012-06-28 DIAGNOSIS — O09299 Supervision of pregnancy with other poor reproductive or obstetric history, unspecified trimester: Secondary | ICD-10-CM | POA: Insufficient documentation

## 2012-06-28 NOTE — Progress Notes (Signed)
Alisha Hughes  was seen today for an ultrasound appointment.  See full report in [facility].  Impression: Single IUP at 13 1/7 weeks NT- 1.7 mm; Nasal bone visualized First trimester aneuploidy screen performed as noted above.    Recommendations: Please do not draw triple/quad screen, though patient should be offered MSAFP for neural tube defect screening.  Recommend ultrasound for fetal anatomy at approximately [redacted] weeks gestation  Alpha Gula, MD

## 2012-06-29 NOTE — Addendum Note (Signed)
Encounter addended by: Ty Hilts, RN on: 06/29/2012  9:53 AM<BR>     Documentation filed: Charges VN, OB Prenatal Vitals, Episodes

## 2012-07-01 ENCOUNTER — Ambulatory Visit (INDEPENDENT_AMBULATORY_CARE_PROVIDER_SITE_OTHER): Payer: Private Health Insurance - Indemnity | Admitting: Obstetrics & Gynecology

## 2012-07-01 VITALS — BP 116/87 | Wt 168.0 lb

## 2012-07-01 DIAGNOSIS — Z98891 History of uterine scar from previous surgery: Secondary | ICD-10-CM

## 2012-07-01 DIAGNOSIS — R3915 Urgency of urination: Secondary | ICD-10-CM

## 2012-07-01 DIAGNOSIS — Z9889 Other specified postprocedural states: Secondary | ICD-10-CM

## 2012-07-01 DIAGNOSIS — Z3482 Encounter for supervision of other normal pregnancy, second trimester: Secondary | ICD-10-CM

## 2012-07-01 DIAGNOSIS — Z3480 Encounter for supervision of other normal pregnancy, unspecified trimester: Secondary | ICD-10-CM

## 2012-07-01 LAB — POCT URINALYSIS DIPSTICK
Bilirubin, UA: NEGATIVE
Nitrite, UA: NEGATIVE
Spec Grav, UA: 1.015
pH, UA: 6

## 2012-07-01 NOTE — Progress Notes (Signed)
Reports urinary urgency; UA negative for infection this visit.  Normal NT, awaiting report of first screen.  Will draw MSAFP next visit.  Anatomy ultrasound scheduled.  Discussed TOLAC vs RCS; consent given to her to review at home.  Obstetric precautions reviewed.

## 2012-07-01 NOTE — Patient Instructions (Signed)
Vaginal Birth After Cesarean Delivery  Vaginal birth after Cesarean delivery (VBAC) is giving birth vaginally after previously delivering a baby by a cesarean. In the past, if a woman had a Cesarean delivery, all births afterwards would be done by Cesarean delivery. This is no longer true. It can be safe for the mother to try a vaginal delivery after having a Cesarean. The final decision to have a VBAC or repeat Cesarean delivery should be between the patient and her caregiver. The risks and benefits can be discussed relative to the reason for, and the type of the previous Cesarean delivery.  WOMEN WHO PLAN TO HAVE A VBAC SHOULD CHECK WITH THEIR DOCTOR TO BE SURE THAT:  · The previous Cesarean was done with a low transverse uterine incision (not a vertical classical incision).  · The birth canal is big enough for the baby.  · There were no other operations on the uterus.  · They will have an electronic fetal monitor (EFM) on at all times during labor.  · An operating room would be available and ready in case an emergency Cesarean is needed.  · A doctor and surgical nursing staff would be available at all times during labor to be ready to do an emergency Cesarean if necessary.  · An anesthesiologist would be present in case an emergency Cesarean is needed.  · The nursery is prepared and has adequate personnel and necessary equipment available to care for the baby in case of an emergency Cesarean.  BENEFITS OF VBAC:  · Shorter stay in the hospital.  · Lower delivery, nursery and hospital costs.  · Less blood loss and need for blood transfusions.  · Less fever and discomfort from major surgery.  · Lower risk of blood clots.  · Lower risk of infection.  · Shorter recovery after going home.  · Lower risk of other surgical complications, such as opening of the incision or hernia in the incision.  · Decreased risk of injury to other organs.  · Decreased risk for having to remove the uterus (hysterectomy).  · Decreased risk  for the placenta to completely or partially cover the opening of the uterus (placenta previa) with a future pregnancy.  · Ability to have a larger family if desired.  RISKS OF A VBAC:  · Rupture of the uterus.  · Having to remove the uterus (hysterectomy) if it ruptures.  · All the complications of major surgery and/or injury to other organs.  · Excessive bleeding, blood clots and infection.  · Lower Apgar scores (method to evaluate the newborn based on appearance, pulse, grimace, activity, and respiration) and more risks to the baby.  · There is a higher risk of uterine rupture if you induce or augment labor.  · There is a higher risk of uterine rupture if you use medications to ripen the cervix.  VBAC SHOULD NOT BE DONE IF:  · The previous Cesarean was done with a vertical (classical) or T-shaped incision, or you do not know what kind of an incision was made.  · You had a ruptured uterus.  · You had surgery on your uterus.  · You have medical or obstetrical problems.  · There are problems with the baby.  · There were two previous Cesarean deliveries and no vaginal deliveries.  OTHER FACTS TO KNOW ABOUT VBAC:  · It is safe to have an epidural anesthetic with VBAC.  · It is safe to turn the baby from a breech position (attempt an external   cephalic version).  · It is safe to try a VBAC with twins.  · Pregnancies later than 40 weeks have not been successful with VBAC.  · There is an increased failure rate of a VBAC in obese pregnant women.  · There is an increased failure rate with VABC if the baby weighs 8.8 pounds (4000 grams) or more.  · There is an increased failure rate if the time between the Cesarean and VBAC is less than 19 months.  · There is an increased failure rate if pre-eclampsia is present (high blood pressure, protein in the urine and swelling of face and extremities).  · VBAC is very successful if there was a previous vaginal birth.  · VBAC is very successful when the labor starts spontaneously before  the due date.  · Delivery of VBAC is similar to having a normal spontaneous vaginal delivery.  It is important to discuss VBAC with your caregiver early in the pregnancy so you can understand the risks, benefits and options. It will give you time to decide what is best in your particular case relevant to the reason for your previous Cesarean delivery. It should be understood that medical changes in the mother or pregnancy may occur during the pregnancy, which make it necessary to change you or your caregiver's initial decision. The counseling, concerns and decisions should be documented in the medical record and signed by all parties.  Document Released: 09/21/2006 Document Revised: 06/23/2011 Document Reviewed: 05/12/2008  [employer]® Patient Information ©2013 [employer].

## 2012-07-29 ENCOUNTER — Ambulatory Visit (INDEPENDENT_AMBULATORY_CARE_PROVIDER_SITE_OTHER): Payer: Private Health Insurance - Indemnity | Admitting: Obstetrics & Gynecology

## 2012-07-29 ENCOUNTER — Encounter: Payer: Self-pay | Admitting: Obstetrics & Gynecology

## 2012-07-29 VITALS — BP 121/89 | Wt 176.0 lb

## 2012-07-29 DIAGNOSIS — Z3480 Encounter for supervision of other normal pregnancy, unspecified trimester: Secondary | ICD-10-CM

## 2012-07-29 DIAGNOSIS — Z9889 Other specified postprocedural states: Secondary | ICD-10-CM

## 2012-07-29 DIAGNOSIS — Z3482 Encounter for supervision of other normal pregnancy, second trimester: Secondary | ICD-10-CM

## 2012-07-29 DIAGNOSIS — Z98891 History of uterine scar from previous surgery: Secondary | ICD-10-CM

## 2012-07-29 NOTE — Progress Notes (Signed)
Patient has been experiencing feeling like she is going to pass out, tunnel vision, cold sweats, need to sit down.  She also has had more panic attacks and migraines due to increased stressers at home regarding child care.

## 2012-07-29 NOTE — Progress Notes (Signed)
Routine visit. No OB problems. Anatomy u/s next week. MSAFP today.

## 2012-07-30 ENCOUNTER — Encounter: Payer: Private Health Insurance - Indemnity | Admitting: Obstetrics & Gynecology

## 2012-08-02 ENCOUNTER — Encounter: Payer: Self-pay | Admitting: Obstetrics & Gynecology

## 2012-08-02 ENCOUNTER — Encounter: Payer: Self-pay | Admitting: *Deleted

## 2012-08-02 ENCOUNTER — Ambulatory Visit (HOSPITAL_COMMUNITY)
Admission: RE | Admit: 2012-08-02 | Discharge: 2012-08-02 | Disposition: A | Discharge status: Home / Self Care | Payer: Private Health Insurance - Indemnity | Source: Ambulatory Visit | Attending: Obstetrics & Gynecology | Admitting: Obstetrics & Gynecology

## 2012-08-02 DIAGNOSIS — O09299 Supervision of pregnancy with other poor reproductive or obstetric history, unspecified trimester: Secondary | ICD-10-CM | POA: Insufficient documentation

## 2012-08-02 DIAGNOSIS — Z8751 Personal history of pre-term labor: Secondary | ICD-10-CM | POA: Insufficient documentation

## 2012-08-02 DIAGNOSIS — O34219 Maternal care for unspecified type scar from previous cesarean delivery: Secondary | ICD-10-CM | POA: Insufficient documentation

## 2012-08-02 DIAGNOSIS — Z3482 Encounter for supervision of other normal pregnancy, second trimester: Secondary | ICD-10-CM

## 2012-08-02 DIAGNOSIS — Z1389 Encounter for screening for other disorder: Secondary | ICD-10-CM | POA: Insufficient documentation

## 2012-08-02 DIAGNOSIS — Z98891 History of uterine scar from previous surgery: Secondary | ICD-10-CM

## 2012-08-02 DIAGNOSIS — O358XX0 Maternal care for other (suspected) fetal abnormality and damage, not applicable or unspecified: Secondary | ICD-10-CM | POA: Insufficient documentation

## 2012-08-02 DIAGNOSIS — Z363 Encounter for antenatal screening for malformations: Secondary | ICD-10-CM | POA: Insufficient documentation

## 2012-08-03 ENCOUNTER — Encounter: Payer: Self-pay | Admitting: Obstetrics & Gynecology

## 2012-08-03 LAB — ALPHA FETOPROTEIN, MATERNAL
AFP: 31.9 IU/mL
Curr Gest Age: 17.4 wks.days
MoM for AFP: 0.93

## 2012-08-27 ENCOUNTER — Ambulatory Visit (INDEPENDENT_AMBULATORY_CARE_PROVIDER_SITE_OTHER): Payer: Private Health Insurance - Indemnity | Admitting: Obstetrics & Gynecology

## 2012-08-27 VITALS — BP 112/73 | Wt 179.0 lb

## 2012-08-27 DIAGNOSIS — Z3480 Encounter for supervision of other normal pregnancy, unspecified trimester: Secondary | ICD-10-CM

## 2012-08-27 DIAGNOSIS — Z98891 History of uterine scar from previous surgery: Secondary | ICD-10-CM

## 2012-08-27 NOTE — Progress Notes (Signed)
She has decided that she does want to have a repeat c/section and she does want to have a BTL.

## 2012-08-27 NOTE — Progress Notes (Signed)
Normal anatomy and screening.  Desires RCS and BTL. No other complaints or concerns.  Routine obstetric precautions reviewed.

## 2012-08-27 NOTE — Patient Instructions (Signed)
Sterilization Information, Female Female sterilization is a procedure to permanently prevent pregnancy. There are different ways to perform sterilization, but all either block or close the fallopian tubes so that your eggs cannot reach your uterus. If your egg cannot reach your uterus, sperm cannot fertilize the egg, and you cannot get pregnant.  Sterilization is performed by a surgical procedure. Sometimes these procedures are performed in a hospital while a patient is asleep. Sometimes they can be done in a clinic setting with the patient awake. The fallopian tubes can be surgically cut, tied, or sealed through a procedure called tubal ligation. The fallopian tubes can also be closed with clips or rings. Sterilization can also be done by placing a tiny coil into each fallopian tube, which causes scar tissue to grow inside the tube. The scar tissue then blocks the tubes.  Discuss sterilization with your caregiver to answer any concerns you or your partner may have. You may want to ask what type of sterilization your caregiver performs. Some caregivers may not perform all the various options. Sterilization is permanent and should only be done if you are sure you do not want children or do not want any more children. Having a sterilization reversed may not be successful.  STERILIZATION PROCEDURES  Laparoscopic sterilization. This is a surgical method performed at a time other than right after childbirth. Two incisions are made in the lower abdomen. A thin, lighted tube (laparoscope) is inserted into one of the incisions and is used to perform the procedure. The fallopian tubes are closed with a ring or a clip. An instrument that uses heat could be used to seal the tubes closed (electrocautery).   Mini-laparotomy. This is a surgical method done 1 or 2 days after giving birth. Typically, a small incision is made just below the belly button (umbilicus) and the fallopian tubes are exposed. The tubes can then be  sealed, tied, or cut.   Hysteroscopic sterilization. This is performed at a time other than right after childbirth. A tiny, spring-like coil is inserted through the cervix and uterus and placed into the fallopian tubes. The coil causes scaring and blocks the tubes. Other forms of contraception should be used for 3 months after the procedure to allow the scar tissue to form completely. Additionally, it is required hysterosalpingography be done 3 months later to ensure that the procedure was successful. Hysterosalpingography is a procedure that uses X-rays to look at your uterus and fallopian tubes after a material to make them show up better has been inserted. IS STERILIZATION SAFE? Sterilization is considered safe with very rare complications. Risks depend on the type of procedure you have. As with any surgical procedure, there are risks. Some risks of sterilization by any means include:   Bleeding.  Infection.  Reaction to anesthesia medicine.  Injury to surrounding organs. Risks specific to having hysteroscopic coils placed include:  The coils may not be placed correctly the first time.   The coils may move out of place.   The tubes may not get completely blocked after 3 months.   Injury to surrounding organs when placing the coil.  HOW EFFECTIVE IS FEMALE STERILIZATION? Sterilization is nearly 100% effective, but it can fail. Depending on the type of sterilization, the rate of failure can be as high as 3%. After hysteroscopic sterilization with placement of fallopian tube coils, you will need back-up birth control for 3 months after the procedure. Sterilization is effective for a lifetime.  BENEFITS OF STERILIZATION  It does   not affect your hormones, and therefore will not affect your menstrual periods, sexual desire, or performance.   It is effective for a lifetime.   It is safe.   You do not need to worry about getting pregnant. Keep in mind that if you had the  hysteroscopic placement procedure, you must wait 3 months after the procedure (or until your caregiver confirms) before pregnancy is not considered possible.   There are no side effects unlike other types of birth control (contraception).  DRAWBACKS OF STERILIZATION  You must be sure you do not want children or any more children. The procedure is permanent.   It does not provide protection against sexually transmitted infections (STIs).   The tubes can grow back together. If this happens, there is a risk of pregnancy. There is also an increased risk (50%) of pregnancy being an ectopic pregnancy. This is a pregnancy that happens outside of the uterus. Document Released: 09/17/2007 Document Revised: 09/30/2011 Document Reviewed: 07/17/2011 [facility] Patient Information 2013 [place], [place].   Cesarean Delivery  Cesarean delivery is the birth of a baby through a cut (incision) in the abdomen and womb (uterus).  LET YOUR CAREGIVER KNOW ABOUT:  Complicationsinvolving the pregnancy.  Allergies.  Medicines taken including herbs, eyedrops, over-the-counter medicines, and creams.  Use of steroids (by mouth or creams).  Previous problems with anesthetics or numbing medicine.  Previous surgery.  History of blood clots.  History of bleeding or blood problems.  Other health problems. RISKS AND COMPLICATIONS   Bleeding.  Infection.  Blood clots.  Injury to surrounding organs.  Anesthesia problems.  Injury to the baby. BEFORE THE PROCEDURE   A tube (Foley catheter) will be placed in your bladder. The Foley catheter drains the urine from your bladder into a bag. This keeps your bladder empty during surgery.  An intravenous access tube (IV) will be placed in your arm.  Hair may be removed from your pubic area and your lower abdomen. This is to prevent infection in the incision site.  You may be given an antacid medicine to drink. This will prevent acid contents in your  stomach from going into your lungs if you vomit during the surgery.  You may be given an antibiotic medicine to prevent infection. PROCEDURE   You may be given medicine to numb the lower half of your body (regional anesthetic). If you were in labor, you may have already had an epidural in place which can be used in both labor and cesarean delivery. You may possibly be given medicine to make you sleep (general anesthetic) though this is not as common.  An incision will be made in your abdomen that extends to your uterus. There are 2 basic kinds of incisions:  The horizontal (transverse) incision. Horizontal incisions are used for most routine cesarean deliveries.  The vertical (up and down) incision. This is less commonly used. This is most often reserved for women who have a serious complication (extreme prematurity) or under emergency situations.  The horizontal and vertical incisions may both be used at the same time. However, this is very uncommon.  Your baby will then be delivered. AFTER THE PROCEDURE   If you were awake during the surgery, you will see your baby right away. If you were asleep, you will see your baby as soon as you are awake.  You may breastfeed your baby after surgery.  You may be able to get up and walk the same day as the surgery. If you need to  stay in bed for a period of time, you will receive help to turn, cough, and take deep breaths after surgery. This helps prevent lung problems such as pneumonia.  Do not get out of bed alone the first time after surgery. You will need help getting out of bed until you are able to do this by yourself.  You may be able to shower the day after your cesarean delivery. After the bandage (dressing) is taken off the incision site, a nurse will assist you to shower, if you like.  You will have pneumatic compressing hose placed on your feet or lower legs. These hose are used to prevent blood clots. When you are up and walking  regularly, they will no longer be necessary.  Do not cross your legs when you sit.  Save any blood clots that you pass. If you pass a clot while on the toilet, do not flush it. Call for the nurse. Tell the nurse if you think you are bleeding too much or passing too many clots.  Start drinking liquids and eating food as directed by your caregiver. If your stomach is not ready, drinking and eating too soon can cause an increase in bloating and swelling of your intestine and abdomen. This is very uncomfortable.  You will be given medicine as needed. Let your caregivers know if you are hurting. They want you to be comfortable. You may also be given an antibiotic to prevent an infection.  Your IV will be taken out when you are drinking a reasonable amount of fluids. The Foley catheter is taken out when you are up and walking.  If your blood type is Rh negative and your baby's blood type is Rh positive, you will be given a shot of anti-D immune globulin. This shot prevents you from having Rh problems with a future pregnancy. You should get the shot even if you had your tubes tied (tubal ligation).  If you are allowed to take the baby for a walk, place the baby in the bassinet and push it. Do not carry your baby in your arms. Document Released: 03/31/2005 Document Revised: 06/23/2011 Document Reviewed: 07/26/2010 [facility] Patient Information 2013 [place], [place].

## 2012-09-03 ENCOUNTER — Inpatient Hospital Stay (HOSPITAL_COMMUNITY)
Admission: AD | Admit: 2012-09-03 | Discharge: 2012-09-03 | Disposition: A | Discharge status: Home / Self Care | Payer: Managed Care, Other (non HMO) | Source: Ambulatory Visit | Attending: Obstetrics and Gynecology | Admitting: Obstetrics and Gynecology

## 2012-09-03 ENCOUNTER — Encounter (HOSPITAL_COMMUNITY): Payer: Self-pay | Admitting: Family

## 2012-09-03 DIAGNOSIS — Z98891 History of uterine scar from previous surgery: Secondary | ICD-10-CM

## 2012-09-03 DIAGNOSIS — O26892 Other specified pregnancy related conditions, second trimester: Secondary | ICD-10-CM

## 2012-09-03 DIAGNOSIS — R51 Headache: Secondary | ICD-10-CM

## 2012-09-03 DIAGNOSIS — IMO0002 Reserved for concepts with insufficient information to code with codable children: Secondary | ICD-10-CM | POA: Insufficient documentation

## 2012-09-03 DIAGNOSIS — R519 Headache, unspecified: Secondary | ICD-10-CM

## 2012-09-03 DIAGNOSIS — Z3482 Encounter for supervision of other normal pregnancy, second trimester: Secondary | ICD-10-CM

## 2012-09-03 DIAGNOSIS — O99891 Other specified diseases and conditions complicating pregnancy: Secondary | ICD-10-CM

## 2012-09-03 LAB — COMPREHENSIVE METABOLIC PANEL
Albumin: 2.5 g/dL — ABNORMAL LOW (ref 3.5–5.2)
Alkaline Phosphatase: 67 U/L (ref 39–117)
BUN: 6 mg/dL (ref 6–23)
Calcium: 8.7 mg/dL (ref 8.4–10.5)
Creatinine, Ser: 0.52 mg/dL (ref 0.50–1.10)
Potassium: 3.8 mEq/L (ref 3.5–5.1)
Total Protein: 5.8 g/dL — ABNORMAL LOW (ref 6.0–8.3)

## 2012-09-03 LAB — PROTEIN / CREATININE RATIO, URINE
Creatinine, Urine: 214.09 mg/dL
Total Protein, Urine: 16 mg/dL

## 2012-09-03 LAB — URINALYSIS, ROUTINE W REFLEX MICROSCOPIC
Glucose, UA: NEGATIVE mg/dL
Leukocytes, UA: NEGATIVE
Nitrite: NEGATIVE
Specific Gravity, Urine: 1.025 (ref 1.005–1.030)
pH: 6 (ref 5.0–8.0)

## 2012-09-03 LAB — CBC
Hemoglobin: 11.2 g/dL — ABNORMAL LOW (ref 12.0–15.0)
MCHC: 34.4 g/dL (ref 30.0–36.0)
Platelets: 259 10*3/uL (ref 150–400)

## 2012-09-03 LAB — URINE MICROSCOPIC-ADD ON

## 2012-09-03 MED ORDER — BUTALBITAL-APAP-CAFFEINE 50-325-40 MG PO TABS
2.0000 | ORAL_TABLET | Freq: Four times a day (QID) | ORAL | Status: DC | PRN
  Administered 2012-09-03: 1 via ORAL
  Filled 2012-09-03: qty 2

## 2012-09-03 MED ORDER — BUTALBITAL-APAP-CAFFEINE 50-325-40 MG PO TABS: 1.0000 | ORAL_TABLET | ORAL | Status: DC | PRN

## 2012-09-03 NOTE — MAU Note (Addendum)
Patient presents to MAU with c/o headache since last night; reports seeing spots. Reports +FM; denies vaginal bleeding, LOF.  Hx of preeclampsia with first pregnancy. Last dose of Tylenol at 1130 today.

## 2012-09-03 NOTE — MAU Provider Note (Signed)
History     CSN: [id number]  Arrival date and time: 09/03/12 1336   First Provider Initiated Contact with Patient 09/03/12 1516      Chief Complaint  Patient presents with  . Leg Swelling  . Headache  . Abdominal Pain  . Nausea  . Dizziness   HPI  Alisha Hughes is a 31 y.o. female [id number] at [redacted]w[redacted]d who presents to the MAU today with headache since last night; reports seeing spots. PMH is significant for preeclampsia with first pregnancy and migraines.  She states that in the last few days she has noticed an increase in her ankle swelling and a headache that started last night relieved by Tylenol from an 8/10 to 6/10.  Last dose of Tylenol at 1130 today and has used Fioricet in the past.  [facility] for her prenatal care and is due to see them June 13th.  She states that her BP has been elevated at previous visits, but doesn't have issue with BP when not pregnant.  She rates her current headache pain at 6/10. She reports +FM; denies vaginal bleeding or vaginal discharge.    OB History   Grav Para Term Preterm Abortions TAB SAB Ect Mult Living   2 1 0 1      1      Past Medical History  Diagnosis Date  . Anxiety   . Pregnancy induced hypertension   . Abnormal Pap smear   . Depression   . Preterm labor   . Headache     migranes.  . Ovarian cyst     Past Surgical History  Procedure Laterality Date  . Colposcopy vulva w/ biopsy      abnormal pap  . Cesarean section    . Colonoscopy      2009  . Cholecystectomy      2009  . Wisdom tooth extraction      x4    Family History  Problem Relation Age of Onset  . Hypertension Mother   . Heart disease Father   . Hypertension Father   . Hyperlipidemia Father   . Depression Brother   . Multiple sclerosis Maternal Grandmother   . Heart disease Maternal Grandfather   . Heart disease Paternal Grandmother   . Parkinson's disease Paternal Grandfather     History  Substance Use  Topics  . Smoking status: Never Smoker   . Smokeless tobacco: Not on file  . Alcohol Use: Yes     Comment: rarely    Allergies: No Known Allergies  Prescriptions prior to admission  Medication Sig Dispense Refill  . acetaminophen (TYLENOL) 325 MG tablet Take 650 mg by mouth every 6 (six) hours as needed for pain.      . Prenatal Vit-Fe Fumarate-FA (MULTIVITAMIN-PRENATAL) 27-0.8 MG TABS Take 1 tablet by mouth at bedtime.         ROS  Pertinent pos and neg mentioned in HPI  Physical Exam   Blood pressure 117/73, pulse 78, temperature 98 F (36.7 C), temperature source Oral, resp. rate 18, height 5' 2.75" (1.594 m), weight 82.01 kg (180 lb 12.8 oz), last menstrual period 03/28/2012, SpO2 100.00%.  Physical Exam  Constitutional: She is oriented to person, place, and time. She appears well-developed and well-nourished. No distress.  HENT:  Head: Normocephalic and atraumatic.  Eyes: Conjunctivae and EOM are normal.  Neck: Normal range of motion. Neck supple.  Cardiovascular: Normal rate, regular rhythm and normal heart sounds.  Respiratory: Effort normal and breath sounds normal. No respiratory distress.  GI: Soft. Bowel sounds are normal. There is no tenderness. There is no rebound and no guarding.  Gravid, size appropriate for dates  Musculoskeletal: Normal range of motion. She exhibits edema (mild ankle edema bilaterally). She exhibits no tenderness.  Neurological: She is alert and oriented to person, place, and time. She has normal reflexes.  Skin: Skin is warm and dry.  Psychiatric: She has a normal mood and affect.   FHTs by doppler: 145  MAU Course  Procedures  Results for orders placed during the hospital encounter of 09/03/12 (from the past 24 hour(s))  PROTEIN / CREATININE RATIO, URINE     Status: None   Collection Time    09/03/12  1:55 PM      Result Value Range   Creatinine, Urine 214.09     Total Protein, Urine 16     PROTEIN CREATININE RATIO 0.07  0.00 -  0.15  URINALYSIS, ROUTINE W REFLEX MICROSCOPIC     Status: Abnormal   Collection Time    09/03/12  1:55 PM      Result Value Range   Color, Urine YELLOW  YELLOW   APPearance CLEAR  CLEAR   Specific Gravity, Urine 1.025  1.005 - 1.030   pH 6.0  5.0 - 8.0   Glucose, UA NEGATIVE  NEGATIVE mg/dL   Hgb urine dipstick TRACE (*) NEGATIVE   Bilirubin Urine NEGATIVE  NEGATIVE   Ketones, ur 15 (*) NEGATIVE mg/dL   Protein, ur NEGATIVE  NEGATIVE mg/dL   Urobilinogen, UA 0.2  0.0 - 1.0 mg/dL   Nitrite NEGATIVE  NEGATIVE   Leukocytes, UA NEGATIVE  NEGATIVE  URINE MICROSCOPIC-ADD ON     Status: Abnormal   Collection Time    09/03/12  1:55 PM      Result Value Range   Squamous Epithelial / LPF FEW (*) RARE   WBC, UA 0-2  <3 WBC/hpf   RBC / HPF 0-2  <3 RBC/hpf   Bacteria, UA FEW (*) RARE   Urine-Other MUCOUS PRESENT    CBC     Status: Abnormal   Collection Time    09/03/12  2:59 PM      Result Value Range   WBC 11.5 (*) 4.0 - 10.5 K/uL   RBC 3.87  3.87 - 5.11 MIL/uL   Hemoglobin 11.2 (*) 12.0 - 15.0 g/dL   HCT 16.1 (*) 09.6 - 04.5 %   MCV 84.2  78.0 - 100.0 fL   MCH 28.9  26.0 - 34.0 pg   MCHC 34.4  30.0 - 36.0 g/dL   RDW 40.9  81.1 - 91.4 %   Platelets 259  150 - 400 K/uL  COMPREHENSIVE METABOLIC PANEL     Status: Abnormal   Collection Time    09/03/12  2:59 PM      Result Value Range   Sodium 133 (*) 135 - 145 mEq/L   Potassium 3.8  3.5 - 5.1 mEq/L   Chloride 100  96 - 112 mEq/L   CO2 27  19 - 32 mEq/L   Glucose, Bld 95  70 - 99 mg/dL   BUN 6  6 - 23 mg/dL   Creatinine, Ser 7.82  0.50 - 1.10 mg/dL   Calcium 8.7  8.4 - 95.6 mg/dL   Total Protein 5.8 (*) 6.0 - 8.3 g/dL   Albumin 2.5 (*) 3.5 - 5.2 g/dL   AST 17  0 - 37 U/L  ALT 13  0 - 35 U/L   Alkaline Phosphatase 67  39 - 117 U/L   Total Bilirubin 0.2 (*) 0.3 - 1.2 mg/dL   GFR calc non Af Amer >90  >90 mL/min   GFR calc Af Amer >90  >90 mL/min    Assessment and Plan  A:  31 y.o. [id number] at [redacted]w[redacted]d with:  Headache in  pregnancy without signs of preeclampsia  P: Fioricet as needed for pain Follow up with [facility] as appointed Should your condition worsen, should you cease to feel your baby move, or should you experience any vaginal bleeding; return to the MAU immediately for further evaluation.    Anna Genre 09/03/2012, 3:16 PM   I saw and examined patient and agree with above student note. I reviewed history, imaging, labs, and vitals.  Napoleon Form, MD

## 2012-09-03 NOTE — MAU Note (Signed)
Patient states she has a history of preeclampsia. States she has been having headaches, "floaters", increasing swelling, nausea and abdominal.pain. Reports good fetal movement, no leaking or bleeding with slightly more discharge.

## 2012-09-05 NOTE — MAU Provider Note (Signed)
Attestation of Attending Supervision of Advanced Practitioner (CNM/NP): Evaluation and management procedures were performed by the Advanced Practitioner under my supervision and collaboration.  I have reviewed the Advanced Practitioner's note and chart, and I agree with the management and plan.  Chester Sibert 09/05/2012 7:46 AM

## 2012-09-07 ENCOUNTER — Encounter: Payer: Private Health Insurance - Indemnity | Admitting: Family Medicine

## 2012-09-23 ENCOUNTER — Encounter: Payer: Private Health Insurance - Indemnity | Admitting: Obstetrics & Gynecology

## 2012-09-28 ENCOUNTER — Encounter: Payer: Self-pay | Admitting: Obstetrics & Gynecology

## 2012-09-28 ENCOUNTER — Ambulatory Visit (INDEPENDENT_AMBULATORY_CARE_PROVIDER_SITE_OTHER): Payer: Managed Care, Other (non HMO) | Admitting: Obstetrics & Gynecology

## 2012-09-28 VITALS — BP 110/81 | Wt 184.0 lb

## 2012-09-28 DIAGNOSIS — Z3480 Encounter for supervision of other normal pregnancy, unspecified trimester: Secondary | ICD-10-CM

## 2012-09-28 DIAGNOSIS — Z3482 Encounter for supervision of other normal pregnancy, second trimester: Secondary | ICD-10-CM

## 2012-09-28 NOTE — Progress Notes (Signed)
Having bad lower back pain.  Doing 1hr GTT today.

## 2012-09-28 NOTE — Progress Notes (Signed)
Routine visit. Good Fm. No OB problems. Weight gain congratulated.  Glucola, labs today. C/o LBP and sciatica. I have recommended chiropractic care.

## 2012-09-30 LAB — CBC
HCT: 35.7 % — ABNORMAL LOW (ref 36.0–46.0)
MCH: 28.6 pg (ref 26.0–34.0)
MCV: 87.9 fL (ref 78.0–100.0)
Platelets: 254 10*3/uL (ref 150–400)
RDW: 14.2 % (ref 11.5–15.5)

## 2012-10-13 ENCOUNTER — Other Ambulatory Visit (INDEPENDENT_AMBULATORY_CARE_PROVIDER_SITE_OTHER): Payer: Managed Care, Other (non HMO) | Admitting: *Deleted

## 2012-10-13 DIAGNOSIS — R300 Dysuria: Secondary | ICD-10-CM

## 2012-10-13 LAB — POCT URINALYSIS DIPSTICK
Bilirubin, UA: NEGATIVE
Ketones, UA: NEGATIVE
Leukocytes, UA: NEGATIVE
Protein, UA: NEGATIVE
Spec Grav, UA: 1.02
pH, UA: 6

## 2012-10-13 NOTE — Progress Notes (Signed)
Pt came in the office today complaining of burning during urination.  Pt dropped off a urine specimen and was told we would call her with the results.  Today the urine was negative on the dipstick.  We have called pt and left her a message to call us back reguarding results.  Pt has an OB appt tomorrow.  We will re-check her urine tomorrow to see if anything has changed.

## 2012-10-14 ENCOUNTER — Ambulatory Visit (INDEPENDENT_AMBULATORY_CARE_PROVIDER_SITE_OTHER): Payer: Managed Care, Other (non HMO) | Admitting: Obstetrics & Gynecology

## 2012-10-14 VITALS — BP 136/81 | Wt 185.0 lb

## 2012-10-14 DIAGNOSIS — Z3483 Encounter for supervision of other normal pregnancy, third trimester: Secondary | ICD-10-CM

## 2012-10-14 DIAGNOSIS — Z3480 Encounter for supervision of other normal pregnancy, unspecified trimester: Secondary | ICD-10-CM

## 2012-10-14 NOTE — Progress Notes (Signed)
P-100 

## 2012-10-14 NOTE — Progress Notes (Signed)
GCT = 109; nml labs; wants rpt c/s and BTL.

## 2012-10-18 ENCOUNTER — Encounter: Payer: Self-pay | Admitting: Family Medicine

## 2012-10-18 ENCOUNTER — Inpatient Hospital Stay (HOSPITAL_COMMUNITY)
Admission: AD | Admit: 2012-10-18 | Discharge: 2012-10-19 | Disposition: A | Discharge status: Home / Self Care | Payer: Managed Care, Other (non HMO) | Source: Ambulatory Visit | Attending: Family Medicine | Admitting: Family Medicine

## 2012-10-18 ENCOUNTER — Encounter (HOSPITAL_COMMUNITY): Payer: Self-pay | Admitting: *Deleted

## 2012-10-18 ENCOUNTER — Ambulatory Visit (INDEPENDENT_AMBULATORY_CARE_PROVIDER_SITE_OTHER): Payer: Managed Care, Other (non HMO) | Admitting: Family Medicine

## 2012-10-18 VITALS — BP 113/83 | Wt 188.0 lb

## 2012-10-18 DIAGNOSIS — Z3483 Encounter for supervision of other normal pregnancy, third trimester: Secondary | ICD-10-CM

## 2012-10-18 DIAGNOSIS — R519 Headache, unspecified: Secondary | ICD-10-CM

## 2012-10-18 DIAGNOSIS — O09299 Supervision of pregnancy with other poor reproductive or obstetric history, unspecified trimester: Secondary | ICD-10-CM

## 2012-10-18 DIAGNOSIS — O99891 Other specified diseases and conditions complicating pregnancy: Secondary | ICD-10-CM | POA: Insufficient documentation

## 2012-10-18 DIAGNOSIS — O26893 Other specified pregnancy related conditions, third trimester: Secondary | ICD-10-CM

## 2012-10-18 DIAGNOSIS — O09293 Supervision of pregnancy with other poor reproductive or obstetric history, third trimester: Secondary | ICD-10-CM

## 2012-10-18 DIAGNOSIS — Z3480 Encounter for supervision of other normal pregnancy, unspecified trimester: Secondary | ICD-10-CM

## 2012-10-18 DIAGNOSIS — R51 Headache: Secondary | ICD-10-CM | POA: Insufficient documentation

## 2012-10-18 LAB — CBC
HCT: 34 % — ABNORMAL LOW (ref 36.0–46.0)
Hemoglobin: 11.2 g/dL — ABNORMAL LOW (ref 12.0–15.0)
MCV: 84.4 fL (ref 78.0–100.0)
RDW: 13.9 % (ref 11.5–15.5)
WBC: 11.5 10*3/uL — ABNORMAL HIGH (ref 4.0–10.5)

## 2012-10-18 LAB — URINALYSIS, ROUTINE W REFLEX MICROSCOPIC
Bilirubin Urine: NEGATIVE
Ketones, ur: NEGATIVE mg/dL
Leukocytes, UA: NEGATIVE
Nitrite: NEGATIVE
Protein, ur: NEGATIVE mg/dL
Urobilinogen, UA: 0.2 mg/dL (ref 0.0–1.0)

## 2012-10-18 MED ORDER — CYCLOBENZAPRINE HCL 10 MG PO TABS: 10.0000 mg | ORAL_TABLET | Freq: Three times a day (TID) | ORAL | Status: DC | PRN

## 2012-10-18 MED ORDER — DEXAMETHASONE SODIUM PHOSPHATE 10 MG/ML IJ SOLN
10.0000 mg | Freq: Once | INTRAMUSCULAR | Status: AC
  Administered 2012-10-18: 10 mg via INTRAVENOUS
  Filled 2012-10-18: qty 1

## 2012-10-18 MED ORDER — DIPHENHYDRAMINE HCL 50 MG/ML IJ SOLN
25.0000 mg | Freq: Once | INTRAMUSCULAR | Status: AC
  Administered 2012-10-18: 25 mg via INTRAVENOUS
  Filled 2012-10-18: qty 1

## 2012-10-18 MED ORDER — PROCHLORPERAZINE EDISYLATE 5 MG/ML IJ SOLN
10.0000 mg | Freq: Once | INTRAMUSCULAR | Status: AC
  Administered 2012-10-18: 10 mg via INTRAVENOUS
  Filled 2012-10-18: qty 2

## 2012-10-18 MED ORDER — ONDANSETRON HCL 4 MG/2ML IJ SOLN
4.0000 mg | Freq: Four times a day (QID) | INTRAMUSCULAR | Status: DC | PRN
  Administered 2012-10-19: 4 mg via INTRAVENOUS
  Filled 2012-10-18: qty 2

## 2012-10-18 NOTE — Progress Notes (Signed)
Not feeling well--headache x 4 days with Fioricet.  Less fetal movement.  Reports elevated BP last pregnancy.  Trial of Flexeril.  Will check labs.  U/s shows excellent fluid, vtx, movement and tone noted.

## 2012-10-18 NOTE — Patient Instructions (Addendum)
Preeclampsia and Eclampsia Preeclampsia is a condition of high blood pressure during pregnancy. It can happen at 20 weeks or later in pregnancy. If high blood pressure occurs in the second half of pregnancy with no other symptoms, it is called gestational hypertension and goes away after the baby is born. If any of the symptoms listed below develop with gestational hypertension, it is then called preeclampsia. Eclampsia (convulsions) may follow preeclampsia. This is one of the reasons for regular prenatal checkups. Early diagnosis and treatment are very important to prevent eclampsia. CAUSES  There is no known cause of preeclampsia/eclampsia in pregnancy. There are several known conditions that may put the pregnant woman at risk, such as:  The first pregnancy.  Having preeclampsia in a past pregnancy.  Having lasting (chronic) high blood pressure.  Having multiples (twins, triplets).  Being age 35 or older.  African American ethnic background.  Having kidney disease or diabetes.  Medical conditions such as lupus or blood diseases.  Being overweight (obese). SYMPTOMS   High blood pressure.  Headaches.  Sudden weight gain.  Swelling of hands, face, legs, and feet.  Protein in the urine.  Feeling sick to your stomach (nauseous) and throwing up (vomiting).  Vision problems (blurred or double vision).  Numbness in the face, arms, legs, and feet.  Dizziness.  Slurred speech.  Preeclampsia can cause growth retardation in the fetus.  Separation (abruption) of the placenta.  Not enough fluid in the amniotic sac (oligohydramnios).  Sensitivity to bright lights.  Belly (abdominal) pain. DIAGNOSIS  If protein is found in the urine in the second half of pregnancy, this is considered preeclampsia. Other symptoms mentioned above may also be present. TREATMENT  It is necessary to treat this.  Your caregiver may prescribe bed rest early in this condition. Plenty of rest and  salt restriction may be all that is needed.  Medicines may be necessary to lower blood pressure if the condition does not respond to more conservative measures.  In more severe cases, hospitalization may be needed:  For treatment of blood pressure.  To control fluid retention.  To monitor the baby to see if the condition is causing harm to the baby.  Hospitalization is the best way to treat the first sign of preeclampsia. This is so the mother and baby can be watched closely and blood tests can be done effectively and correctly.  If the condition becomes severe, it may be necessary to induce labor or to remove the infant by surgical means (cesarean section). The best cure for preeclampsia/eclampsia is to deliver the baby. Preeclampsia and eclampsia involve risks to mother and infant. Your caregiver will discuss these risks with you. Together, you can work out the best possible approach to your problems. Make sure you keep your prenatal visits as scheduled. Not keeping appointments could result in a chronic or permanent injury, pain, disability to you, and death or injury to you or your unborn baby. If there is any problem keeping the appointment, you must call to reschedule. HOME CARE INSTRUCTIONS   Keep your prenatal appointments and tests as scheduled.  Tell your caregiver if you have any of the above risk factors.  Get plenty of rest and sleep.  Eat a balanced diet that is low in salt, and do not add salt to your food.  Avoid stressful situations.  Only take over-the-counter and prescriptions medicines for pain, discomfort, or fever as directed by your caregiver. SEEK IMMEDIATE MEDICAL CARE IF:   You develop severe swelling   anywhere in the body. This usually occurs in the legs.  You gain 5 lb/2.3 kg or more in a week.  You develop a severe headache, dizziness, problems with your vision, or confusion.  You have abdominal pain, nausea, or vomiting.  You have a seizure.  You  have trouble moving any part of your body, or you develop numbness or problems speaking.  You have bruising or abnormal bleeding from anywhere in the body.  You develop a stiff neck.  You pass out. MAKE SURE YOU:   Understand these instructions.  Will watch your condition.  Will get help right away if you are not doing well or get worse. Document Released: 03/28/2000 Document Revised: 06/23/2011 Document Reviewed: 11/12/2007 [employer] Patient Information 2014 [employer].  Breastfeeding A change in hormones during your pregnancy causes growth of your breast tissue and an increase in number and size of milk ducts. The hormone prolactin allows proteins, sugars, and fats from your blood supply to make breast milk in your milk-producing glands. The hormone progesterone prevents breast milk from being released before the birth of your baby. After the birth of your baby, your progesterone level decreases allowing breast milk to be released. Thoughts of your baby, as well as his or her sucking or crying, can stimulate the release of milk from the milk-producing glands. Deciding to breastfeed (nurse) is one of the best choices you can make for you and your baby. The information that follows gives a brief review of the benefits, as well as other important skills to know about breastfeeding. BENEFITS OF BREASTFEEDING For your baby  The first milk (colostrum) helps your baby's digestive system function better.   There are antibodies in your milk that help your baby fight off infections.   Your baby has a lower incidence of asthma, allergies, and sudden infant death syndrome (SIDS).   The nutrients in breast milk are better for your baby than infant formulas.  Breast milk improves your baby's brain development.   Your baby will have less gas, colic, and constipation.  Your baby is less likely to develop other conditions, such as childhood obesity, asthma, or diabetes mellitus. For  you  Breastfeeding helps develop a very special bond between you and your baby.   Breastfeeding is convenient, always available at the correct temperature, and costs nothing.   Breastfeeding helps to burn calories and helps you lose the weight gained during pregnancy.   Breastfeeding makes your uterus contract back down to normal size faster and slows bleeding following delivery.   Breastfeeding mothers have a lower risk of developing osteoporosis or breast or ovarian cancer later in life.  BREASTFEEDING FREQUENCY  A healthy, full-term baby may breastfeed as often as every hour or space his or her feedings to every 3 hours. Breastfeeding frequency will vary from baby to baby.   Newborns should be fed no less than every 2 3 hours during the day and every 4 5 hours during the night. You should breastfeed a minimum of 8 feedings in a 24 hour period.  Awaken your baby to breastfeed if it has been 3 4 hours since the last feeding.  Breastfeed when you feel the need to reduce the fullness of your breasts or when your newborn shows signs of hunger. Signs that your baby may be hungry include:  Increased alertness or activity.  Stretching.  Movement of the head from side to side.  Movement of the head and opening of the mouth when the corner of   the mouth or cheek is stroked (rooting).  Increased sucking sounds, smacking lips, cooing, sighing, or squeaking.  Hand-to-mouth movements.  Increased sucking of fingers or hands.  Fussing.  Intermittent crying.  Signs of extreme hunger will require calming and consoling before you try to feed your baby. Signs of extreme hunger may include:  Restlessness.  A loud, strong cry.  Screaming.  Frequent feeding will help you make more milk and will help prevent problems, such as sore nipples and engorgement of the breasts.  BREASTFEEDING   Whether lying down or sitting, be sure that the baby's abdomen is facing your abdomen.    Support your breast with 4 fingers under your breast and your thumb above your nipple. Make sure your fingers are well away from your nipple and your baby's mouth.   Stroke your baby's lips gently with your finger or nipple.   When your baby's mouth is open wide enough, place all of your nipple and as much of the colored area around your nipple (areola) as possible into your baby's mouth.  More areola should be visible above his or her upper lip than below his or her lower lip.  Your baby's tongue should be between his or her lower gum and your breast.  Ensure that your baby's mouth is correctly positioned around the nipple (latched). Your baby's lips should create a seal on your breast.  Signs that your baby has effectively latched onto your nipple include:  Tugging or sucking without pain.  Swallowing heard between sucks.  Absent click or smacking sound.  Muscle movement above and in front of his or her ears with sucking.  Your baby must suck about 2 3 minutes in order to get your milk. Allow your baby to feed on each breast as long as he or she wants. Nurse your baby until he or she unlatches or falls asleep at the first breast, then offer the second breast.  Signs that your baby is full and satisfied include:  A gradual decrease in the number of sucks or complete cessation of sucking.  Falling asleep.  Extension or relaxation of his or her body.  Retention of a small amount of milk in his or her mouth.  Letting go of your breast by himself or herself.  Signs of effective breastfeeding in you include:  Breasts that have increased firmness, weight, and size prior to feeding.  Breasts that are softer after nursing.  Increased milk volume, as well as a change in milk consistency and color by the 5th day of breastfeeding.  Breast fullness relieved by breastfeeding.  Nipples are not sore, cracked, or bleeding.  If needed, break the suction by putting your finger  into the corner of your baby's mouth and sliding your finger between his or her gums. Then, remove your breast from his or her mouth.  It is common for babies to spit up a small amount after a feeding.  Babies often swallow air during feeding. This can make babies fussy. Burping your baby between breasts can help with this.  Vitamin D supplements are recommended for babies who get only breast milk.  Avoid using a pacifier during your baby's first 4 6 weeks.  Avoid supplemental feedings of water, formula, or juice in place of breastfeeding. Breast milk is all the food your baby needs. It is not necessary for your baby to have water or formula. Your breasts will make more milk if supplemental feedings are avoided during the early weeks. HOW TO   TELL WHETHER YOUR BABY IS GETTING ENOUGH BREAST MILK Wondering whether or not your baby is getting enough milk is a common concern among mothers. You can be assured that your baby is getting enough milk if:   Your baby is actively sucking and you hear swallowing.   Your baby seems relaxed and satisfied after a feeding.   Your baby nurses at least 8 12 times in a 24 hour time period.  During the first 3 5 days of age:  Your baby is wetting at least 3 5 diapers in a 24 hour period. The urine should be clear and pale yellow.  Your baby is having at least 3 4 stools in a 24 hour period. The stool should be soft and yellow.  At 5 7 days of age, your baby is having at least 3 6 stools in a 24 hour period. The stool should be seedy and yellow by 5 days of age.  Your baby has a weight loss less than 7 10% during the first 3 days of age.  Your baby does not lose weight after 3 7 days of age.  Your baby gains 4 7 ounces each week after he or she is 4 days of age.  Your baby gains weight by 5 days of age and is back to birth weight within 2 weeks. ENGORGEMENT In the first week after your baby is born, you may experience extremely full breasts  (engorgement). When engorged, your breasts may feel heavy, warm, or tender to the touch. Engorgement peaks within 24 48 hours after delivery of your baby.  Engorgement may be reduced by:  Continuing to breastfeed.  Increasing the frequency of breastfeeding.  Taking warm showers or applying warm, moist heat to your breasts just before each feeding. This increases circulation and helps the milk flow.   Gently massaging your breast before and during the feedings. With your fingertips, massage from your chest wall towards your nipple in a circular motion.   Ensuring that your baby empties at least one breast at every feeding. It also helps to start the next feeding on the opposite breast.   Expressing breast milk by hand or by using a breast pump to empty the breasts if your baby is sleepy, or not nursing well. You may also want to express milk if you are returning to work oryou feel you are getting engorged.  Ensuring your baby is latched on and positioned properly while breastfeeding. If you follow these suggestions, your engorgement should improve in 24 48 hours. If you are still experiencing difficulty, call your lactation consultant or caregiver.  CARING FOR YOURSELF Take care of your breasts.  Bathe or shower daily.   Avoid using soap on your nipples.   Wear a supportive bra. Avoid wearing underwire style bras.  Air dry your nipples for a 3 4minutes after each feeding.   Use only cotton bra pads to absorb breast milk leakage. Leaking of breast milk between feedings is normal.   Use only pure lanolin on your nipples after nursing. You do not need to wash it off before feeding your baby again. Another option is to express a few drops of breast milk and gently massage that milk into your nipples.  Continue breast self-awareness checks. Take care of yourself.  Eat healthy foods. Alternate 3 meals with 3 snacks.  Avoid foods that you notice affect your baby in a bad  way.  Drink milk, fruit juice, and water to satisfy your thirst (about 8   glasses a day).   Rest often, relax, and take your prenatal vitamins to prevent fatigue, stress, and anemia.  Avoid chewing and smoking tobacco.  Avoid alcohol and drug use.  Take over-the-counter and prescribed medicine only as directed by your caregiver or pharmacist. You should always check with your caregiver or pharmacist before taking any new medicine, vitamin, or herbal supplement.  Know that pregnancy is possible while breastfeeding. If desired, talk to your caregiver about family planning and safe birth control methods that may be used while breastfeeding. SEEK MEDICAL CARE IF:   You feel like you want to stop breastfeeding or have become frustrated with breastfeeding.  You have painful breasts or nipples.  Your nipples are cracked or bleeding.  Your breasts are red, tender, or warm.  You have a swollen area on either breast.  You have a fever or chills.  You have nausea or vomiting.  You have drainage from your nipples.  Your breasts do not become full before feedings by the 5th day after delivery.  You feel sad and depressed.  Your baby is too sleepy to eat well.  Your baby is having trouble sleeping.   Your baby is wetting less than 3 diapers in a 24 hour period.  Your baby has less than 3 stools in a 24 hour period.  Your baby's skin or the white part of his or her eyes becomes more yellow.   Your baby is not gaining weight by 5 days of age. MAKE SURE YOU:   Understand these instructions.  Will watch your condition.  Will get help right away if you are not doing well or get worse. Document Released: 03/31/2005 Document Revised: 12/24/2011 Document Reviewed: 11/05/2011 [employer] Patient Information 2014 [employer].  

## 2012-10-18 NOTE — Progress Notes (Signed)
P=88 

## 2012-10-18 NOTE — MAU Note (Signed)
Pt G2 P1 at 29.1wks with a headache since 7/3, taking fioricet, not helping.  Pt was seen in the office today, given flexeril which made headache worse.  Hx of PIH, labs done today.  Nausea, blurred vision.

## 2012-10-18 NOTE — MAU Provider Note (Signed)
History     CSN: [id number]  Arrival date and time: 10/18/12 2225   First Provider Initiated Contact with Patient 10/18/12 2317      Chief Complaint  Patient presents with  . Headache   HPI 32 y.o. G2P0101 at [redacted]w[redacted]d with headache for 4 days, blurred vision, nausea. History of migraines with similar symptoms prior to pregnancy, but first time during current pregnancy. Tried Fioricet but no longer helping. Seen in clinic today and given rx for Flexeril, but feels it made her headache worse. Checked her BP at home and it was 145/90s. Some swelling of lower extremities. Hx preeclampsia with prior pregnancy requiring magnesium. Started with high BP and headaches, swelling at around 30 weeks, induced at 36 weeks. No HTN between pregnancies but does have family hx of HTN.   OB History   Grav Para Term Preterm Abortions TAB SAB Ect Mult Living   2 1 0 1      1      Past Medical History  Diagnosis Date  . Anxiety   . Pregnancy induced hypertension   . Abnormal Pap smear   . Depression   . Preterm labor   . Headache(784.0)     migranes.  . Ovarian cyst     Past Surgical History  Procedure Laterality Date  . Colposcopy vulva w/ biopsy      abnormal pap  . Cesarean section    . Colonoscopy      2009  . Cholecystectomy      2009  . Wisdom tooth extraction      x4    Family History  Problem Relation Age of Onset  . Hypertension Mother   . Heart disease Father   . Hypertension Father   . Hyperlipidemia Father   . Depression Brother   . Multiple sclerosis Maternal Grandmother   . Heart disease Maternal Grandfather   . Heart disease Paternal Grandmother   . Parkinson's disease Paternal Grandfather     History  Substance Use Topics  . Smoking status: Never Smoker   . Smokeless tobacco: Not on file  . Alcohol Use: Yes     Comment: rarely    Allergies: No Known Allergies  Prescriptions prior to admission  Medication Sig Dispense Refill  . acetaminophen (TYLENOL) 325  MG tablet Take 650 mg by mouth every 6 (six) hours as needed for pain.      . butalbital-acetaminophen-caffeine (FIORICET, ESGIC) 50-325-40 MG per tablet Take 1-2 tablets by mouth every 4 (four) hours as needed.  30 tablet  0  . cyclobenzaprine (FLEXERIL) 10 MG tablet Take 1 tablet (10 mg total) by mouth 3 (three) times daily as needed for muscle spasms.  30 tablet  1  . Prenatal Vit-Fe Fumarate-FA (MULTIVITAMIN-PRENATAL) 27-0.8 MG TABS Take 1 tablet by mouth at bedtime.         ROS See HPI  Physical Exam   Blood pressure 130/80, pulse 79, temperature 98 F (36.7 C), temperature source Oral, resp. rate 18, height 5\' 3"  (1.6 m), weight 85.095 kg (187 lb 9.6 oz), last menstrual period 03/28/2012. Filed Vitals:   10/18/12 2317 10/18/12 2333 10/18/12 2347 10/19/12 0002  BP: 134/82 123/86 133/99 128/78  Pulse: 83 89 107 88  Temp:      TempSrc:      Resp:      Height:      Weight:         Physical Exam GEN:  WNWD, no distress HEENT:  NCAT, EOMI,  conjunctiva clear NECK:  Supple, non-tender CV: RRR, no murmur RESP:  CTAB ABD:  Soft, non-tender, no guarding or rebound, normal bowel sounds EXTREM:  Warm, well perfused, no edema or tenderness NEURO:  Alert, oriented, no focal deficits, DTRs 2+ in lower extremities, no clonus GU:  Deferred   FHTs:  130, moderate variability, accels present, no decels TOCO: no ctx  Results for orders placed during the hospital encounter of 10/18/12 (from the past 24 hour(s))  URINALYSIS, ROUTINE W REFLEX MICROSCOPIC     Status: None   Collection Time    10/18/12 10:45 PM      Result Value Range   Color, Urine YELLOW  YELLOW   APPearance CLEAR  CLEAR   Specific Gravity, Urine 1.010  1.005 - 1.030   pH 6.5  5.0 - 8.0   Glucose, UA NEGATIVE  NEGATIVE mg/dL   Hgb urine dipstick NEGATIVE  NEGATIVE   Bilirubin Urine NEGATIVE  NEGATIVE   Ketones, ur NEGATIVE  NEGATIVE mg/dL   Protein, ur NEGATIVE  NEGATIVE mg/dL   Urobilinogen, UA 0.2  0.0 - 1.0 mg/dL    Nitrite NEGATIVE  NEGATIVE   Leukocytes, UA NEGATIVE  NEGATIVE     MAU Course  Procedures  Received 1 liter fluid, decadron and compazine in MAU. Pain went from 10/10 --> 3/10.  Assessment and Plan  31 y.o. G2P0101 at [redacted]w[redacted]d with headache. - Migraine - treated with decadron/compazine with relief of symptoms. Resume flexeril, tylenol, fioricet or occasional ibuprofen as needed. - BP normal - PIH labs done in clinic today- pending. UA negative for protein. - FHTs reactive - Stable for discharge home. F/U in [facility] 7/17.  Napoleon Form 10/18/2012, 11:19 PM

## 2012-10-19 ENCOUNTER — Telehealth: Payer: Self-pay | Admitting: *Deleted

## 2012-10-19 DIAGNOSIS — O9989 Other specified diseases and conditions complicating pregnancy, childbirth and the puerperium: Secondary | ICD-10-CM

## 2012-10-19 LAB — COMPREHENSIVE METABOLIC PANEL
AST: 13 U/L (ref 0–37)
Albumin: 3 g/dL — ABNORMAL LOW (ref 3.5–5.2)
BUN: 6 mg/dL (ref 6–23)
Calcium: 8.8 mg/dL (ref 8.4–10.5)
Chloride: 106 mEq/L (ref 96–112)
Creat: 0.45 mg/dL — ABNORMAL LOW (ref 0.50–1.10)
Glucose, Bld: 88 mg/dL (ref 70–99)
Potassium: 3.9 mEq/L (ref 3.5–5.3)

## 2012-10-19 LAB — PROTEIN / CREATININE RATIO, URINE
Creatinine, Urine: 32.3 mg/dL
Total Protein, Urine: <3 mg/dL

## 2012-10-19 MED ORDER — BUTALBITAL-APAP-CAFFEINE 50-325-40 MG PO TABS: 1.0000 | ORAL_TABLET | ORAL | Status: DC | PRN

## 2012-10-19 NOTE — Telephone Encounter (Signed)
Message copied by Grayland Ormond on Tue Oct 19, 2012  9:25 AM ------      Message from: Reva Bores      Created: Tue Oct 19, 2012  7:52 AM       Normal labs--inform pt. ------

## 2012-10-19 NOTE — Telephone Encounter (Signed)
Pt aware of normal labs

## 2012-10-19 NOTE — MAU Provider Note (Signed)
Chart reviewed and agree with management and plan.  

## 2012-10-28 ENCOUNTER — Ambulatory Visit (INDEPENDENT_AMBULATORY_CARE_PROVIDER_SITE_OTHER): Payer: Managed Care, Other (non HMO) | Admitting: Family Medicine

## 2012-10-28 ENCOUNTER — Encounter: Payer: Self-pay | Admitting: Family Medicine

## 2012-10-28 VITALS — BP 117/85 | Wt 188.0 lb

## 2012-10-28 DIAGNOSIS — Z23 Encounter for immunization: Secondary | ICD-10-CM

## 2012-10-28 DIAGNOSIS — O34219 Maternal care for unspecified type scar from previous cesarean delivery: Secondary | ICD-10-CM

## 2012-10-28 DIAGNOSIS — Z3480 Encounter for supervision of other normal pregnancy, unspecified trimester: Secondary | ICD-10-CM

## 2012-10-28 MED ORDER — TETANUS-DIPHTH-ACELL PERTUSSIS 5-2.5-18.5 LF-MCG/0.5 IM SUSP: 0.5000 mL | Freq: Once | INTRAMUSCULAR | Status: DC

## 2012-10-28 NOTE — Progress Notes (Signed)
Still with headache--to see Bonita Quin ASAP. TDaP given

## 2012-10-28 NOTE — Patient Instructions (Signed)
Pregnancy - Third Trimester The third trimester of pregnancy (the last 3 months) is a period of the most rapid growth for you and your baby. The baby approaches a length of 20 inches and a weight of 6 to 10 pounds. The baby is adding on fat and getting ready for life outside your body. While inside, babies have periods of sleeping and waking, sucking thumbs, and hiccuping. You can often feel small contractions of the uterus. This is false labor. It is also called Braxton-Hicks contractions. This is like a practice for labor. The usual problems in this stage of pregnancy include more difficulty breathing, swelling of the hands and feet from water retention, and having to urinate more often because of the uterus and baby pressing on your bladder.  PRENATAL EXAMS  Blood work may continue to be done during prenatal exams. These tests are done to check on your health and the probable health of your baby. Blood work is used to follow your blood levels (hemoglobin). Anemia (low hemoglobin) is common during pregnancy. Iron and vitamins are given to help prevent this. You may also continue to be checked for diabetes. Some of the past blood tests may be done again.  The size of the uterus is measured during each visit. This makes sure your baby is growing properly according to your pregnancy dates.  Your blood pressure is checked every prenatal visit. This is to make sure you are not getting toxemia.  Your urine is checked every prenatal visit for infection, diabetes, and protein.  Your weight is checked at each visit. This is done to make sure gains are happening at the suggested rate and that you and your baby are growing normally.  Sometimes, an ultrasound is performed to confirm the position and the proper growth and development of the baby. This is a test done that bounces harmless sound waves off the baby so your caregiver can more accurately determine a due date.  Discuss the type of pain medicine and  anesthesia you will have during your labor and delivery.  Discuss the possibility and anesthesia if a cesarean section might be necessary.  Inform your caregiver if there is any mental or physical violence at home. Sometimes, a specialized non-stress test, contraction stress test, and biophysical profile are done to make sure the baby is not having a problem. Checking the amniotic fluid surrounding the baby is called an amniocentesis. The amniotic fluid is removed by sticking a needle into the belly (abdomen). This is sometimes done near the end of pregnancy if an early delivery is required. In this case, it is done to help make sure the baby's lungs are mature enough for the baby to live outside of the womb. If the lungs are not mature and it is unsafe to deliver the baby, an injection of cortisone medicine is given to the mother 1 to 2 days before the delivery. This helps the baby's lungs mature and makes it safer to deliver the baby. CHANGES OCCURING IN THE THIRD TRIMESTER OF PREGNANCY Your body goes through many changes during pregnancy. They vary from person to person. Talk to your caregiver about changes you notice and are concerned about.  During the last trimester, you have probably had an increase in your appetite. It is normal to have cravings for certain foods. This varies from person to person and pregnancy to pregnancy.  You may begin to get stretch marks on your hips, abdomen, and breasts. These are normal changes in the body   during pregnancy. There are no exercises or medicines to take which prevent this change.  Constipation may be treated with a stool softener or adding bulk to your diet. Drinking lots of fluids, fiber in vegetables, fruits, and whole grains are helpful.  Exercising is also helpful. If you have been very active up until your pregnancy, most of these activities can be continued during your pregnancy. If you have been less active, it is helpful to start an exercise  program such as walking. Consult your caregiver before starting exercise programs.  Avoid all smoking, alcohol, non-prescribed drugs, herbs and "street drugs" during your pregnancy. These chemicals affect the formation and growth of the baby. Avoid chemicals throughout the pregnancy to ensure the delivery of a healthy infant.  Backache, varicose veins, and hemorrhoids may develop or get worse.  You will tire more easily in the third trimester, which is normal.  The baby's movements may be stronger and more often.  You may become short of breath easily.  Your belly button may stick out.  A yellow discharge may leak from your breasts called colostrum.  You may have a bloody mucus discharge. This usually occurs a few days to a week before labor begins. HOME CARE INSTRUCTIONS   Keep your caregiver's appointments. Follow your caregiver's instructions regarding medicine use, exercise, and diet.  During pregnancy, you are providing food for you and your baby. Continue to eat regular, well-balanced meals. Choose foods such as meat, fish, milk and other low fat dairy products, vegetables, fruits, and whole-grain breads and cereals. Your caregiver will tell you of the ideal weight gain.  A physical sexual relationship may be continued throughout pregnancy if there are no other problems such as early (premature) leaking of amniotic fluid from the membranes, vaginal bleeding, or belly (abdominal) pain.  Exercise regularly if there are no restrictions. Check with your caregiver if you are unsure of the safety of your exercises. Greater weight gain will occur in the last 2 trimesters of pregnancy. Exercising helps:  Control your weight.  Get you in shape for labor and delivery.  You lose weight after you deliver.  Rest a lot with legs elevated, or as needed for leg cramps or low back pain.  Wear a good support or jogging bra for breast tenderness during pregnancy. This may help if worn during  sleep. Pads or tissues may be used in the bra if you are leaking colostrum.  Do not use hot tubs, steam rooms, or saunas.  Wear your seat belt when driving. This protects you and your baby if you are in an accident.  Avoid raw meat, cat litter boxes and soil used by cats. These carry germs that can cause birth defects in the baby.  It is easier to leak urine during pregnancy. Tightening up and strengthening the pelvic muscles will help with this problem. You can practice stopping your urination while you are going to the bathroom. These are the same muscles you need to strengthen. It is also the muscles you would use if you were trying to stop from passing gas. You can practice tightening these muscles up 10 times a set and repeating this about 3 times per day. Once you know what muscles to tighten up, do not perform these exercises during urination. It is more likely to cause an infection by backing up the urine.  Ask for help if you have financial, counseling, or nutritional needs during pregnancy. Your caregiver will be able to offer counseling for these   needs as well as refer you for other special needs.  Make a list of emergency phone numbers and have them available.  Plan on getting help from family or friends when you go home from the hospital.  Make a trial run to the hospital.  Take prenatal classes with the father to understand, practice, and ask questions about the labor and delivery.  Prepare the baby's room or nursery.  Do not travel out of the city unless it is absolutely necessary and with the advice of your caregiver.  Wear only low or no heal shoes to have better balance and prevent falling. MEDICINES AND DRUG USE IN PREGNANCY  Take prenatal vitamins as directed. The vitamin should contain 1 milligram of folic acid. Keep all vitamins out of reach of children. Only a couple vitamins or tablets containing iron may be fatal to a baby or young child when ingested.  Avoid use  of all medicines, including herbs, over-the-counter medicines, not prescribed or suggested by your caregiver. Only take over-the-counter or prescription medicines for pain, discomfort, or fever as directed by your caregiver. Do not use aspirin, ibuprofen or naproxen unless approved by your caregiver.  Let your caregiver also know about herbs you may be using.  Alcohol is related to a number of birth defects. This includes fetal alcohol syndrome. All alcohol, in any form, should be avoided completely. Smoking will cause low birth rate and premature babies.  Illegal drugs are very harmful to the baby. They are absolutely forbidden. A baby born to an addicted mother will be addicted at birth. The baby will go through the same withdrawal an adult does. SEEK MEDICAL CARE IF: You have any concerns or worries during your pregnancy. It is better to call with your questions if you feel they cannot wait, rather than worry about them. SEEK IMMEDIATE MEDICAL CARE IF:   An unexplained oral temperature above 102 F (38.9 C) develops, or as your caregiver suggests.  You have leaking of fluid from the vagina. If leaking membranes are suspected, take your temperature and tell your caregiver of this when you call.  There is vaginal spotting, bleeding or passing clots. Tell your caregiver of the amount and how many pads are used.  You develop a bad smelling vaginal discharge with a change in the color from clear to white.  You develop vomiting that lasts more than 24 hours.  You develop chills or fever.  You develop shortness of breath.  You develop burning on urination.  You loose more than 2 pounds of weight or gain more than 2 pounds of weight or as suggested by your caregiver.  You notice sudden swelling of your face, hands, and feet or legs.  You develop belly (abdominal) pain. Round ligament discomfort is a common non-cancerous (benign) cause of abdominal pain in pregnancy. Your caregiver still  must evaluate you.  You develop a severe headache that does not go away.  You develop visual problems, blurred or double vision.  If you have not felt your baby move for more than 1 hour. If you think the baby is not moving as much as usual, eat something with sugar in it and lie down on your left side for an hour. The baby should move at least 4 to 5 times per hour. Call right away if your baby moves less than that.  You fall, are in a car accident, or any kind of trauma.  There is mental or physical violence at home. Document Released: 03/25/2001   Document Revised: 12/24/2011 Document Reviewed: 09/27/2008 [employer] Patient Information 2014 [employer].  Breastfeeding A change in hormones during your pregnancy causes growth of your breast tissue and an increase in number and size of milk ducts. The hormone prolactin allows proteins, sugars, and fats from your blood supply to make breast milk in your milk-producing glands. The hormone progesterone prevents breast milk from being released before the birth of your baby. After the birth of your baby, your progesterone level decreases allowing breast milk to be released. Thoughts of your baby, as well as his or her sucking or crying, can stimulate the release of milk from the milk-producing glands. Deciding to breastfeed (nurse) is one of the best choices you can make for you and your baby. The information that follows gives a brief review of the benefits, as well as other important skills to know about breastfeeding. BENEFITS OF BREASTFEEDING For your baby  The first milk (colostrum) helps your baby's digestive system function better.   There are antibodies in your milk that help your baby fight off infections.   Your baby has a lower incidence of asthma, allergies, and sudden infant death syndrome (SIDS).   The nutrients in breast milk are better for your baby than infant formulas.  Breast milk improves your baby's brain development.    Your baby will have less gas, colic, and constipation.  Your baby is less likely to develop other conditions, such as childhood obesity, asthma, or diabetes mellitus. For you  Breastfeeding helps develop a very special bond between you and your baby.   Breastfeeding is convenient, always available at the correct temperature, and costs nothing.   Breastfeeding helps to burn calories and helps you lose the weight gained during pregnancy.   Breastfeeding makes your uterus contract back down to normal size faster and slows bleeding following delivery.   Breastfeeding mothers have a lower risk of developing osteoporosis or breast or ovarian cancer later in life.  BREASTFEEDING FREQUENCY  A healthy, full-term baby may breastfeed as often as every hour or space his or her feedings to every 3 hours. Breastfeeding frequency will vary from baby to baby.   Newborns should be fed no less than every 2 3 hours during the day and every 4 5 hours during the night. You should breastfeed a minimum of 8 feedings in a 24 hour period.  Awaken your baby to breastfeed if it has been 3 4 hours since the last feeding.  Breastfeed when you feel the need to reduce the fullness of your breasts or when your newborn shows signs of hunger. Signs that your baby may be hungry include:  Increased alertness or activity.  Stretching.  Movement of the head from side to side.  Movement of the head and opening of the mouth when the corner of the mouth or cheek is stroked (rooting).  Increased sucking sounds, smacking lips, cooing, sighing, or squeaking.  Hand-to-mouth movements.  Increased sucking of fingers or hands.  Fussing.  Intermittent crying.  Signs of extreme hunger will require calming and consoling before you try to feed your baby. Signs of extreme hunger may include:  Restlessness.  A loud, strong cry.  Screaming.  Frequent feeding will help you make more milk and will help prevent  problems, such as sore nipples and engorgement of the breasts.  BREASTFEEDING   Whether lying down or sitting, be sure that the baby's abdomen is facing your abdomen.   Support your breast with 4 fingers under your breast   and your thumb above your nipple. Make sure your fingers are well away from your nipple and your baby's mouth.   Stroke your baby's lips gently with your finger or nipple.   When your baby's mouth is open wide enough, place all of your nipple and as much of the colored area around your nipple (areola) as possible into your baby's mouth.  More areola should be visible above his or her upper lip than below his or her lower lip.  Your baby's tongue should be between his or her lower gum and your breast.  Ensure that your baby's mouth is correctly positioned around the nipple (latched). Your baby's lips should create a seal on your breast.  Signs that your baby has effectively latched onto your nipple include:  Tugging or sucking without pain.  Swallowing heard between sucks.  Absent click or smacking sound.  Muscle movement above and in front of his or her ears with sucking.  Your baby must suck about 2 3 minutes in order to get your milk. Allow your baby to feed on each breast as long as he or she wants. Nurse your baby until he or she unlatches or falls asleep at the first breast, then offer the second breast.  Signs that your baby is full and satisfied include:  A gradual decrease in the number of sucks or complete cessation of sucking.  Falling asleep.  Extension or relaxation of his or her body.  Retention of a small amount of milk in his or her mouth.  Letting go of your breast by himself or herself.  Signs of effective breastfeeding in you include:  Breasts that have increased firmness, weight, and size prior to feeding.  Breasts that are softer after nursing.  Increased milk volume, as well as a change in milk consistency and color by the 5th  day of breastfeeding.  Breast fullness relieved by breastfeeding.  Nipples are not sore, cracked, or bleeding.  If needed, break the suction by putting your finger into the corner of your baby's mouth and sliding your finger between his or her gums. Then, remove your breast from his or her mouth.  It is common for babies to spit up a small amount after a feeding.  Babies often swallow air during feeding. This can make babies fussy. Burping your baby between breasts can help with this.  Vitamin D supplements are recommended for babies who get only breast milk.  Avoid using a pacifier during your baby's first 4 6 weeks.  Avoid supplemental feedings of water, formula, or juice in place of breastfeeding. Breast milk is all the food your baby needs. It is not necessary for your baby to have water or formula. Your breasts will make more milk if supplemental feedings are avoided during the early weeks. HOW TO TELL WHETHER YOUR BABY IS GETTING ENOUGH BREAST MILK Wondering whether or not your baby is getting enough milk is a common concern among mothers. You can be assured that your baby is getting enough milk if:   Your baby is actively sucking and you hear swallowing.   Your baby seems relaxed and satisfied after a feeding.   Your baby nurses at least 8 12 times in a 24 hour time period.  During the first 3 5 days of age:  Your baby is wetting at least 3 5 diapers in a 24 hour period. The urine should be clear and pale yellow.  Your baby is having at least 3 4 stools in   a 24 hour period. The stool should be soft and yellow.  At 5 7 days of age, your baby is having at least 3 6 stools in a 24 hour period. The stool should be seedy and yellow by 5 days of age.  Your baby has a weight loss less than 7 10% during the first 3 days of age.  Your baby does not lose weight after 3 7 days of age.  Your baby gains 4 7 ounces each week after he or she is 4 days of age.  Your baby gains weight  by 5 days of age and is back to birth weight within 2 weeks. ENGORGEMENT In the first week after your baby is born, you may experience extremely full breasts (engorgement). When engorged, your breasts may feel heavy, warm, or tender to the touch. Engorgement peaks within 24 48 hours after delivery of your baby.  Engorgement may be reduced by:  Continuing to breastfeed.  Increasing the frequency of breastfeeding.  Taking warm showers or applying warm, moist heat to your breasts just before each feeding. This increases circulation and helps the milk flow.   Gently massaging your breast before and during the feedings. With your fingertips, massage from your chest wall towards your nipple in a circular motion.   Ensuring that your baby empties at least one breast at every feeding. It also helps to start the next feeding on the opposite breast.   Expressing breast milk by hand or by using a breast pump to empty the breasts if your baby is sleepy, or not nursing well. You may also want to express milk if you are returning to work oryou feel you are getting engorged.  Ensuring your baby is latched on and positioned properly while breastfeeding. If you follow these suggestions, your engorgement should improve in 24 48 hours. If you are still experiencing difficulty, call your lactation consultant or caregiver.  CARING FOR YOURSELF Take care of your breasts.  Bathe or shower daily.   Avoid using soap on your nipples.   Wear a supportive bra. Avoid wearing underwire style bras.  Air dry your nipples for a 3 4minutes after each feeding.   Use only cotton bra pads to absorb breast milk leakage. Leaking of breast milk between feedings is normal.   Use only pure lanolin on your nipples after nursing. You do not need to wash it off before feeding your baby again. Another option is to express a few drops of breast milk and gently massage that milk into your nipples.  Continue breast  self-awareness checks. Take care of yourself.  Eat healthy foods. Alternate 3 meals with 3 snacks.  Avoid foods that you notice affect your baby in a bad way.  Drink milk, fruit juice, and water to satisfy your thirst (about 8 glasses a day).   Rest often, relax, and take your prenatal vitamins to prevent fatigue, stress, and anemia.  Avoid chewing and smoking tobacco.  Avoid alcohol and drug use.  Take over-the-counter and prescribed medicine only as directed by your caregiver or pharmacist. You should always check with your caregiver or pharmacist before taking any new medicine, vitamin, or herbal supplement.  Know that pregnancy is possible while breastfeeding. If desired, talk to your caregiver about family planning and safe birth control methods that may be used while breastfeeding. SEEK MEDICAL CARE IF:   You feel like you want to stop breastfeeding or have become frustrated with breastfeeding.  You have painful breasts or nipples.    Your nipples are cracked or bleeding.  Your breasts are red, tender, or warm.  You have a swollen area on either breast.  You have a fever or chills.  You have nausea or vomiting.  You have drainage from your nipples.  Your breasts do not become full before feedings by the 5th day after delivery.  You feel sad and depressed.  Your baby is too sleepy to eat well.  Your baby is having trouble sleeping.   Your baby is wetting less than 3 diapers in a 24 hour period.  Your baby has less than 3 stools in a 24 hour period.  Your baby's skin or the white part of his or her eyes becomes more yellow.   Your baby is not gaining weight by 5 days of age. MAKE SURE YOU:   Understand these instructions.  Will watch your condition.  Will get help right away if you are not doing well or get worse. Document Released: 03/31/2005 Document Revised: 12/24/2011 Document Reviewed: 11/05/2011 [employer] Patient Information 2014 [employer].  

## 2012-10-28 NOTE — Progress Notes (Signed)
[id number].  Pt has had a headache x 14 days.  Pt is taking the Flexeril and Fioricet with no relief.  Pt also complains with sharp pains on her left side under her ribcage.

## 2012-11-04 ENCOUNTER — Telehealth: Payer: Self-pay | Admitting: *Deleted

## 2012-11-09 ENCOUNTER — Encounter: Payer: Managed Care, Other (non HMO) | Admitting: Obstetrics & Gynecology

## 2012-11-09 ENCOUNTER — Ambulatory Visit (INDEPENDENT_AMBULATORY_CARE_PROVIDER_SITE_OTHER): Payer: Managed Care, Other (non HMO) | Admitting: Obstetrics & Gynecology

## 2012-11-09 ENCOUNTER — Institutional Professional Consult (permissible substitution): Payer: Managed Care, Other (non HMO) | Admitting: Nurse Practitioner

## 2012-11-09 VITALS — BP 122/89 | Wt 187.0 lb

## 2012-11-09 DIAGNOSIS — O9989 Other specified diseases and conditions complicating pregnancy, childbirth and the puerperium: Secondary | ICD-10-CM

## 2012-11-09 DIAGNOSIS — Z3483 Encounter for supervision of other normal pregnancy, third trimester: Secondary | ICD-10-CM

## 2012-11-09 DIAGNOSIS — O34219 Maternal care for unspecified type scar from previous cesarean delivery: Secondary | ICD-10-CM

## 2012-11-09 DIAGNOSIS — O09299 Supervision of pregnancy with other poor reproductive or obstetric history, unspecified trimester: Secondary | ICD-10-CM

## 2012-11-09 DIAGNOSIS — O09293 Supervision of pregnancy with other poor reproductive or obstetric history, third trimester: Secondary | ICD-10-CM

## 2012-11-09 DIAGNOSIS — O26893 Other specified pregnancy related conditions, third trimester: Secondary | ICD-10-CM

## 2012-11-09 DIAGNOSIS — R519 Headache, unspecified: Secondary | ICD-10-CM

## 2012-11-09 DIAGNOSIS — Z3480 Encounter for supervision of other normal pregnancy, unspecified trimester: Secondary | ICD-10-CM

## 2012-11-09 MED ORDER — SUMATRIPTAN SUCCINATE 100 MG PO TABS: 100.0000 mg | ORAL_TABLET | Freq: Once | ORAL | Status: DC | PRN

## 2012-11-09 MED ORDER — HYDROCODONE-ACETAMINOPHEN 5-325 MG PO TABS: 1.0000 | ORAL_TABLET | Freq: Four times a day (QID) | ORAL | Status: DC | PRN

## 2012-11-09 MED ORDER — PROMETHAZINE HCL 25 MG PO TABS: 25.0000 mg | ORAL_TABLET | Freq: Four times a day (QID) | ORAL | Status: DC | PRN

## 2012-11-09 NOTE — Patient Instructions (Signed)
Migraine Headache A migraine headache is an intense, throbbing pain on one or both sides of your head. A migraine can last for 30 minutes to several hours. CAUSES  The exact cause of a migraine headache is not always known. However, a migraine may be caused when nerves in the brain become irritated and release chemicals that cause inflammation. This causes pain. SYMPTOMS  Pain on one or both sides of your head.  Pulsating or throbbing pain.  Severe pain that prevents daily activities.  Pain that is aggravated by any physical activity.  Nausea, vomiting, or both.  Dizziness.  Pain with exposure to bright lights, loud noises, or activity.  General sensitivity to bright lights, loud noises, or smells. Before you get a migraine, you may get warning signs that a migraine is coming (aura). An aura may include:  Seeing flashing lights.  Seeing bright spots, halos, or zig-zag lines.  Having tunnel vision or blurred vision.  Having feelings of numbness or tingling.  Having trouble talking.  Having muscle weakness. MIGRAINE TRIGGERS  Alcohol.  Smoking.  Stress.  Menstruation.  Aged cheeses.  Foods or drinks that contain nitrates, glutamate, aspartame, or tyramine.  Lack of sleep.  Chocolate.  Caffeine.  Hunger.  Physical exertion.  Fatigue.  Medicines used to treat chest pain (nitroglycerine), birth control pills, estrogen, and some blood pressure medicines. DIAGNOSIS  A migraine headache is often diagnosed based on:  Symptoms.  Physical examination.  A CT scan or MRI of your head. TREATMENT Medicines may be given for pain and nausea. Medicines can also be given to help prevent recurrent migraines.  HOME CARE INSTRUCTIONS  Only take over-the-counter or prescription medicines for pain or discomfort as directed by your caregiver. The use of long-term narcotics is not recommended.  Lie down in a dark, quiet room when you have a migraine.  Keep a journal  to find out what may trigger your migraine headaches. For example, write down:  What you eat and drink.  How much sleep you get.  Any change to your diet or medicines.  Limit alcohol consumption.  Quit smoking if you smoke.  Get 7 to 9 hours of sleep, or as recommended by your caregiver.  Limit stress.  Keep lights dim if bright lights bother you and make your migraines worse. SEEK IMMEDIATE MEDICAL CARE IF:   Your migraine becomes severe.  You have a fever.  You have a stiff neck.  You have vision loss.  You have muscular weakness or loss of muscle control.  You start losing your balance or have trouble walking.  You feel faint or pass out.  You have severe symptoms that are different from your first symptoms. MAKE SURE YOU:   Understand these instructions.  Will watch your condition.  Will get help right away if you are not doing well or get worse. Document Released: 03/31/2005 Document Revised: 06/23/2011 Document Reviewed: 03/21/2011 [employer] Patient Information 2014 [employer].  

## 2012-11-09 NOTE — Progress Notes (Signed)
Still has persistent headaches, had migraines for 17 straight days not alleviated by Fioricet,  unable to see Alisha Hughes now due to financial constraints.  Has history of migraines, was controlled by Imitrex in the past.  Had a curbside with Bonita Quin about this patient, she recommended Vicodin and Phenergan to help break this migraine cycle, then Imitrex 100 mg daily for maintenance. Will continue close observation.  Continue to monitor BP.  No other complaints or concerns.  Preeclampsia, fetal movement and labor precautions reviewed.  Follow up in one week.

## 2012-11-09 NOTE — Progress Notes (Signed)
P = 91 

## 2012-11-11 NOTE — Telephone Encounter (Signed)
Patient called regarding symptoms she had but they have gone away... She is reassured and will call back if they change or return.

## 2012-11-17 ENCOUNTER — Inpatient Hospital Stay (HOSPITAL_COMMUNITY)
Admission: AD | Admit: 2012-11-17 | Discharge: 2012-11-17 | Disposition: A | Discharge status: Home / Self Care | Payer: Managed Care, Other (non HMO) | Source: Ambulatory Visit | Attending: Obstetrics & Gynecology | Admitting: Obstetrics & Gynecology

## 2012-11-17 ENCOUNTER — Ambulatory Visit (INDEPENDENT_AMBULATORY_CARE_PROVIDER_SITE_OTHER): Payer: Managed Care, Other (non HMO) | Admitting: Obstetrics and Gynecology

## 2012-11-17 ENCOUNTER — Encounter: Payer: Self-pay | Admitting: Obstetrics and Gynecology

## 2012-11-17 ENCOUNTER — Ambulatory Visit: Payer: Managed Care, Other (non HMO)

## 2012-11-17 ENCOUNTER — Encounter (HOSPITAL_COMMUNITY): Payer: Self-pay | Admitting: *Deleted

## 2012-11-17 VITALS — BP 140/95 | Wt 190.0 lb

## 2012-11-17 DIAGNOSIS — O26893 Other specified pregnancy related conditions, third trimester: Secondary | ICD-10-CM

## 2012-11-17 DIAGNOSIS — O34219 Maternal care for unspecified type scar from previous cesarean delivery: Secondary | ICD-10-CM

## 2012-11-17 DIAGNOSIS — R519 Headache, unspecified: Secondary | ICD-10-CM

## 2012-11-17 DIAGNOSIS — O9989 Other specified diseases and conditions complicating pregnancy, childbirth and the puerperium: Secondary | ICD-10-CM

## 2012-11-17 DIAGNOSIS — O2390 Unspecified genitourinary tract infection in pregnancy, unspecified trimester: Secondary | ICD-10-CM

## 2012-11-17 DIAGNOSIS — B951 Streptococcus, group B, as the cause of diseases classified elsewhere: Secondary | ICD-10-CM

## 2012-11-17 DIAGNOSIS — O99891 Other specified diseases and conditions complicating pregnancy: Secondary | ICD-10-CM | POA: Insufficient documentation

## 2012-11-17 DIAGNOSIS — Z3483 Encounter for supervision of other normal pregnancy, third trimester: Secondary | ICD-10-CM

## 2012-11-17 DIAGNOSIS — O09293 Supervision of pregnancy with other poor reproductive or obstetric history, third trimester: Secondary | ICD-10-CM

## 2012-11-17 DIAGNOSIS — R030 Elevated blood-pressure reading, without diagnosis of hypertension: Secondary | ICD-10-CM | POA: Insufficient documentation

## 2012-11-17 DIAGNOSIS — O09299 Supervision of pregnancy with other poor reproductive or obstetric history, unspecified trimester: Secondary | ICD-10-CM

## 2012-11-17 DIAGNOSIS — O4700 False labor before 37 completed weeks of gestation, unspecified trimester: Secondary | ICD-10-CM | POA: Insufficient documentation

## 2012-11-17 DIAGNOSIS — Z3480 Encounter for supervision of other normal pregnancy, unspecified trimester: Secondary | ICD-10-CM

## 2012-11-17 DIAGNOSIS — R51 Headache: Secondary | ICD-10-CM | POA: Insufficient documentation

## 2012-11-17 DIAGNOSIS — I10 Essential (primary) hypertension: Secondary | ICD-10-CM

## 2012-11-17 LAB — URINE MICROSCOPIC-ADD ON

## 2012-11-17 LAB — COMPREHENSIVE METABOLIC PANEL
ALT: 10 U/L (ref 0–35)
Alkaline Phosphatase: 133 U/L — ABNORMAL HIGH (ref 39–117)
BUN: 3 mg/dL — ABNORMAL LOW (ref 6–23)
CO2: 22 mEq/L (ref 19–32)
Calcium: 8.6 mg/dL (ref 8.4–10.5)
GFR calc Af Amer: >90 mL/min (ref >90)
GFR calc non Af Amer: >90 mL/min (ref >90)
Glucose, Bld: 110 mg/dL — ABNORMAL HIGH (ref 70–99)
Potassium: 3.2 mEq/L — ABNORMAL LOW (ref 3.5–5.1)
Sodium: 136 mEq/L (ref 135–145)

## 2012-11-17 LAB — CBC
HCT: 37.3 % (ref 36.0–46.0)
Hemoglobin: 12.5 g/dL (ref 12.0–15.0)
MCH: 28.5 pg (ref 26.0–34.0)
RBC: 4.39 MIL/uL (ref 3.87–5.11)

## 2012-11-17 LAB — URINALYSIS, ROUTINE W REFLEX MICROSCOPIC
Bilirubin Urine: NEGATIVE
Hgb urine dipstick: NEGATIVE
Ketones, ur: NEGATIVE mg/dL
Nitrite: NEGATIVE
Specific Gravity, Urine: 1.02 (ref 1.005–1.030)
Urobilinogen, UA: 0.2 mg/dL (ref 0.0–1.0)

## 2012-11-17 LAB — PROTEIN / CREATININE RATIO, URINE: Total Protein, Urine: 12.1 mg/dL

## 2012-11-17 MED ORDER — BUTALBITAL-APAP-CAFFEINE 50-325-40 MG PO TABS
1.0000 | ORAL_TABLET | Freq: Once | ORAL | Status: AC
  Administered 2012-11-17: 1 via ORAL
  Filled 2012-11-17: qty 1

## 2012-11-17 MED ORDER — BETAMETHASONE SOD PHOS & ACET 6 (3-3) MG/ML IJ SUSP
12.0000 mg | Freq: Once | INTRAMUSCULAR | Status: AC
  Administered 2012-11-17: 12 mg via INTRAMUSCULAR
  Filled 2012-11-17: qty 2

## 2012-11-17 NOTE — Progress Notes (Signed)
Patient reports persistent headaches since 7/3. Patient states migraine med regimen has not helped the headaches improve at all and states that it does not feel like her typical migraines. In addition, patient reports visual changes and facial edema. Patient will be sent to [facility] hospital for further evaluation. Attending on call informed.

## 2012-11-17 NOTE — Progress Notes (Signed)
Having headaches and much increase in swelling. Nausea, visual changes.  Vaginal discharge with odor.

## 2012-11-17 NOTE — MAU Note (Signed)
Seen at clinic today [facility]), +HA since early July, started seeing spots today.BP was elevated

## 2012-11-17 NOTE — MAU Provider Note (Signed)
Chief Complaint:  Headache and PIH eval    Alisha Hughes is a 32 y.o.  G2P0101 with IUP at [redacted]w[redacted]d presenting for Headache and PIH eval  Pt has been having a chronic headache since 10/14/12- has been diagnosed with migraines and has been taking medicine (fiorecet, immitrex). Starting yesterday she noticed she was swelling in her feet and seeing spots in her vision. checked her bp at home and was 145/97 so thought she needed to come in.  Patient states she has been having irregular ctx. contractions, none vaginal bleeding, intact membranes, with active fetal movement.  No fevers, chills, nausea, vomiting, abd pain.   Pt gets her care at [facility].  Baseline spot prot/cr ratio was 0.07 and then too low to calculate.  Seen today and sent here for pre-eclampsia workup due to elevated bp of 140/95.   With her previous pregnancy she was induced for vision changes and elevated blood pressures @ [redacted]w[redacted]d. She had a c/s due to failure to dilate.    Menstrual History: OB History   Grav Para Term Preterm Abortions TAB SAB Ect Mult Living   2 1 0 1      1       Patient's last menstrual period was 03/28/2012.      Past Medical History  Diagnosis Date  . Anxiety   . Pregnancy induced hypertension   . Abnormal Pap smear   . Depression   . Preterm labor   . Headache(784.0)     migranes.  . Ovarian cyst     Past Surgical History  Procedure Laterality Date  . Colposcopy vulva w/ biopsy      abnormal pap  . Cesarean section    . Colonoscopy      2009  . Cholecystectomy      2009  . Wisdom tooth extraction      x4    Family History  Problem Relation Age of Onset  . Hypertension Mother   . Heart disease Father   . Hypertension Father   . Hyperlipidemia Father   . Depression Brother   . Multiple sclerosis Maternal Grandmother   . Heart disease Maternal Grandfather   . Heart disease Paternal Grandmother   . Parkinson's disease Paternal Grandfather     History  Substance Use Topics  .  Smoking status: Never Smoker   . Smokeless tobacco: Not on file  . Alcohol Use: Yes     Comment: rarely      Allergies  Allergen Reactions  . Latex Rash    No prescriptions prior to admission    Review of Systems - Negative except for what is mentioned in HPI.  Physical Exam  Blood pressure 137/98, pulse 93, resp. rate 16, last menstrual period 03/28/2012. GENERAL: Well-developed, well-nourished female in no acute distress.  LUNGS: Clear to auscultation bilaterally.  HEART: Regular rate and rhythm. ABDOMEN: Soft, nontender, nondistended, gravid.  EXTREMITIES: Nontender, no edema, 2+ distal pulses. 2+ DTRs, no clonus  FHT:  Baseline rate 130 bpm   Variability moderate  Accelerations present   Decelerations none Contractions: rare   Labs: Results for orders placed during the hospital encounter of 11/17/12 (from the past 24 hour(s))  URINALYSIS, ROUTINE W REFLEX MICROSCOPIC   Collection Time    11/17/12 10:00 AM      Result Value Range   Color, Urine YELLOW  YELLOW   APPearance CLEAR  CLEAR   Specific Gravity, Urine 1.020  1.005 - 1.030   pH 6.0  5.0 - 8.0   Glucose, UA NEGATIVE  NEGATIVE mg/dL   Hgb urine dipstick NEGATIVE  NEGATIVE   Bilirubin Urine NEGATIVE  NEGATIVE   Ketones, ur NEGATIVE  NEGATIVE mg/dL   Protein, ur NEGATIVE  NEGATIVE mg/dL   Urobilinogen, UA 0.2  0.0 - 1.0 mg/dL   Nitrite NEGATIVE  NEGATIVE   Leukocytes, UA TRACE (*) NEGATIVE  PROTEIN / CREATININE RATIO, URINE   Collection Time    11/17/12 10:00 AM      Result Value Range   Creatinine, Urine 132.61     Total Protein, Urine 12.1     PROTEIN CREATININE RATIO 0.09  0.00 - 0.15  URINE MICROSCOPIC-ADD ON   Collection Time    11/17/12 10:00 AM      Result Value Range   Squamous Epithelial / LPF FEW (*) RARE   WBC, UA 3-6  <3 WBC/hpf   RBC / HPF 0-2  <3 RBC/hpf   Bacteria, UA RARE  RARE   Urine-Other MUCOUS PRESENT    CBC   Collection Time    11/17/12 10:05 AM      Result Value Range    WBC 9.4  4.0 - 10.5 K/uL   RBC 4.39  3.87 - 5.11 MIL/uL   Hemoglobin 12.5  12.0 - 15.0 g/dL   HCT 40.9  81.1 - 91.4 %   MCV 85.0  78.0 - 100.0 fL   MCH 28.5  26.0 - 34.0 pg   MCHC 33.5  30.0 - 36.0 g/dL   RDW 78.2  95.6 - 21.3 %   Platelets 251  150 - 400 K/uL  COMPREHENSIVE METABOLIC PANEL   Collection Time    11/17/12 10:05 AM      Result Value Range   Sodium 136  135 - 145 mEq/Hughes   Potassium 3.2 (*) 3.5 - 5.1 mEq/Hughes   Chloride 103  96 - 112 mEq/Hughes   CO2 22  19 - 32 mEq/Hughes   Glucose, Bld 110 (*) 70 - 99 mg/dL   BUN 3 (*) 6 - 23 mg/dL   Creatinine, Ser 0.86  0.50 - 1.10 mg/dL   Calcium 8.6  8.4 - 57.8 mg/dL   Total Protein 5.6 (*) 6.0 - 8.3 g/dL   Albumin 2.3 (*) 3.5 - 5.2 g/dL   AST 15  0 - 37 U/Hughes   ALT 10  0 - 35 U/Hughes   Alkaline Phosphatase 133 (*) 39 - 117 U/Hughes   Total Bilirubin 0.3  0.3 - 1.2 mg/dL   GFR calc non Af Amer >90  >90 mL/min   GFR calc Af Amer >90  >90 mL/min    Imaging Studies:  No results found.  Assessment: Alisha Hughes is  32 y.o. G2P0101 at [redacted]w[redacted]d presents with Headache and PIH eval  .  Plan: Initial blood pressure elevated at 147/101.  However, all subsequent blood pressures were 120-136/81-97.   - pt with headaches that are chronic and unchanged from her baseline - PIH labs were normal with spot prot/cr of 0.09   - given BMZ x 1 here in the setting of her hx and concern for possible early developing pre-E.  Sent home with 24 hour urine container - she will f/u tomorrow at [facility] for second BMZ and to drop off her 24 hour urine and get a repeat blood pressure.   This plan was discussed with the pt and Dr. Debroah Loop who also came and saw the pt.   Pre-E precautions discussed.  Alisha Hughes 8/6/20143:17 PM

## 2012-11-18 ENCOUNTER — Inpatient Hospital Stay (HOSPITAL_COMMUNITY): Payer: Managed Care, Other (non HMO)

## 2012-11-18 ENCOUNTER — Inpatient Hospital Stay (HOSPITAL_COMMUNITY)
Admission: AD | Admit: 2012-11-18 | Discharge: 2012-11-18 | Disposition: A | Discharge status: Home / Self Care | Payer: Managed Care, Other (non HMO) | Source: Ambulatory Visit | Attending: Obstetrics & Gynecology | Admitting: Obstetrics & Gynecology

## 2012-11-18 ENCOUNTER — Encounter: Payer: Self-pay | Admitting: *Deleted

## 2012-11-18 ENCOUNTER — Ambulatory Visit (INDEPENDENT_AMBULATORY_CARE_PROVIDER_SITE_OTHER): Payer: Managed Care, Other (non HMO) | Admitting: *Deleted

## 2012-11-18 VITALS — BP 131/97 | HR 105 | Wt 191.0 lb

## 2012-11-18 DIAGNOSIS — O139 Gestational [pregnancy-induced] hypertension without significant proteinuria, unspecified trimester: Secondary | ICD-10-CM

## 2012-11-18 DIAGNOSIS — R030 Elevated blood-pressure reading, without diagnosis of hypertension: Secondary | ICD-10-CM | POA: Insufficient documentation

## 2012-11-18 DIAGNOSIS — O368190 Decreased fetal movements, unspecified trimester, not applicable or unspecified: Secondary | ICD-10-CM | POA: Insufficient documentation

## 2012-11-18 DIAGNOSIS — B951 Streptococcus, group B, as the cause of diseases classified elsewhere: Secondary | ICD-10-CM

## 2012-11-18 DIAGNOSIS — R51 Headache: Secondary | ICD-10-CM | POA: Insufficient documentation

## 2012-11-18 DIAGNOSIS — O99891 Other specified diseases and conditions complicating pregnancy: Secondary | ICD-10-CM | POA: Insufficient documentation

## 2012-11-18 DIAGNOSIS — O4700 False labor before 37 completed weeks of gestation, unspecified trimester: Secondary | ICD-10-CM

## 2012-11-18 DIAGNOSIS — R519 Headache, unspecified: Secondary | ICD-10-CM

## 2012-11-18 DIAGNOSIS — O368130 Decreased fetal movements, third trimester, not applicable or unspecified: Secondary | ICD-10-CM

## 2012-11-18 DIAGNOSIS — Z3483 Encounter for supervision of other normal pregnancy, third trimester: Secondary | ICD-10-CM

## 2012-11-18 DIAGNOSIS — O26893 Other specified pregnancy related conditions, third trimester: Secondary | ICD-10-CM

## 2012-11-18 DIAGNOSIS — O09293 Supervision of pregnancy with other poor reproductive or obstetric history, third trimester: Secondary | ICD-10-CM

## 2012-11-18 DIAGNOSIS — O34219 Maternal care for unspecified type scar from previous cesarean delivery: Secondary | ICD-10-CM

## 2012-11-18 LAB — PROTEIN, URINE, 24 HOUR
Collection Interval-UPROT: 24 hours
Urine Total Volume-UPROT: 1475 mL

## 2012-11-18 MED ORDER — BETAMETHASONE SOD PHOS & ACET 6 (3-3) MG/ML IJ SUSP
12.0000 mg | Freq: Once | INTRAMUSCULAR | Status: AC
  Administered 2012-11-18: 12 mg via INTRAMUSCULAR

## 2012-11-18 MED ORDER — BETAMETHASONE SOD PHOS & ACET 6 (3-3) MG/ML IJ SUSP: 12.0000 mg | Freq: Once | INTRAMUSCULAR | Status: DC

## 2012-11-18 NOTE — Progress Notes (Signed)
Patient is here today to drop off 24 hour urine, BP check and receive second betamethasone injection.  I consulted with Dr. Debroah Loop due to patient states that the baby has not been moving very much at all and she just doesn't feel good even with laying on her left side on bed rest.  Her pressures have remained increased.  He advises to give her the steroid injection and have her come to MAU with her urine.  Patient agrees and will head up to MAU now.

## 2012-11-18 NOTE — MAU Provider Note (Cosign Needed)
History     CSN: [id number]  Arrival date and time: 11/18/12 1511   None     Chief Complaint  Patient presents with  . Hypertension   HPI Comments: Alisha Hughes is a 32 y.o. G2P0101 at [redacted]w[redacted]d presenting from clinic after complaining of decreased fetal movement at clinic. Patient is to drop off her 24-hour protein and  receive second dose of betamethasone. Patient received second dose of betamethasone prior to arrival. Patient states that she had painful headaches yesterday that has since improved. Patient states headaches yesterday had visual changes as well.  Patient denies right upper quadrant pain. Patient denies significant swelling.  Patient has felt him to move 2 times a day but not nearly as much as usual. No contractions, no loss of fluid, no vaginal bleeding.  Hypertension Associated symptoms include headaches (headaches yesterday with vision changes). Pertinent negatives include no blurred vision or chest pain.      OB History   Grav Para Term Preterm Abortions TAB SAB Ect Mult Living   2 1 0 1      1      Past Medical History  Diagnosis Date  . Anxiety   . Pregnancy induced hypertension   . Abnormal Pap smear   . Depression   . Preterm labor   . Headache(784.0)     migranes.  . Ovarian cyst     Past Surgical History  Procedure Laterality Date  . Colposcopy vulva w/ biopsy      abnormal pap  . Cesarean section    . Colonoscopy      2009  . Cholecystectomy      2009  . Wisdom tooth extraction      x4    Family History  Problem Relation Age of Onset  . Hypertension Mother   . Heart disease Father   . Hypertension Father   . Hyperlipidemia Father   . Depression Brother   . Multiple sclerosis Maternal Grandmother   . Heart disease Maternal Grandfather   . Heart disease Paternal Grandmother   . Parkinson's disease Paternal Grandfather     History  Substance Use Topics  . Smoking status: Never Smoker   . Smokeless tobacco: Not on file  .  Alcohol Use: Yes     Comment: rarely    Allergies:  Allergies  Allergen Reactions  . Latex Rash    Prescriptions prior to admission  Medication Sig Dispense Refill  . acetaminophen (TYLENOL) 325 MG tablet Take 650 mg by mouth every 6 (six) hours as needed for pain.      . butalbital-acetaminophen-caffeine (FIORICET, ESGIC) 50-325-40 MG per tablet Take 1-2 tablets by mouth every 4 (four) hours as needed.  30 tablet  1  . cyclobenzaprine (FLEXERIL) 10 MG tablet Take 1 tablet (10 mg total) by mouth 3 (three) times daily as needed for muscle spasms.  30 tablet  1  . HYDROcodone-acetaminophen (NORCO/VICODIN) 5-325 MG per tablet Take 1-2 tablets by mouth every 6 (six) hours as needed for pain.  30 tablet  0  . Prenatal Vit-Fe Fumarate-FA (PRENATAL MULTIVITAMIN) TABS tablet Take 1 tablet by mouth at bedtime.      . promethazine (PHENERGAN) 25 MG tablet Take 1 tablet (25 mg total) by mouth every 6 (six) hours as needed for nausea.  30 tablet  3  . SUMAtriptan (IMITREX) 100 MG tablet Take 1 tablet (100 mg total) by mouth once as needed for migraine.  9 tablet  11    Review  of Systems  Constitutional: Negative for fever and chills.  Eyes: Negative for blurred vision and double vision.  Cardiovascular: Negative for chest pain.  Gastrointestinal: Negative for nausea, vomiting and abdominal pain.  Neurological: Positive for headaches (headaches yesterday with vision changes). Negative for dizziness and tingling.   Physical Exam   Blood pressure 139/89, pulse 95, last menstrual period 03/28/2012.  Physical Exam  Nursing note and vitals reviewed. Constitutional: She is oriented to person, place, and time. She appears well-developed and well-nourished. No distress.  Blood pressures and mild range. Not changed from yesterday.  HENT:  Head: Normocephalic and atraumatic.  Neck: Normal range of motion. Neck supple.  Cardiovascular: Normal rate, regular rhythm and normal heart sounds.  Exam reveals  no gallop and no friction rub.   No murmur heard. Respiratory: Effort normal and breath sounds normal. No respiratory distress. She has no wheezes. She has no rales. She exhibits no tenderness.  GI: Soft. She exhibits no distension. There is no tenderness.  Neurological: She is alert and oriented to person, place, and time. She has normal reflexes. She displays normal reflexes. No cranial nerve deficit (grossly intact).  Skin: She is not diaphoretic.  Psychiatric: She has a normal mood and affect. Her behavior is normal. Judgment and thought content normal.    MAU Course  Procedures  MDM Patient monitored in MAU for blood pressures. Protein returned and was reassuring as above. Patient's blood pressures and mild range unchanged from prior. Reviewed labs from yesterday no repeated today. Patient for decreased fetal movement was evaluated with a very reactive NST. Also had a BPP that was 8 out of 8.  Assessment and Plan  Alisha Hughes is a 32 y.o. G2P0101 at [redacted]w[redacted]d . For evaluation of decreased fetal movement headaches. Headaches are  mild and not recurring at this time. Decreased fetal movement is improving. He had a reassuring BPP and NST. Patient discharged followup as previously scheduled. Patient was given preeclampsia precautions prior to discharge.  Tawana Scale 11/18/2012, 6:24 PM

## 2012-11-18 NOTE — MAU Note (Signed)
Pt was evaluated in MAU yesterday for PIH workup and BMZ. Pt went back to [facility] to take her 24 hour urine and to get her 2nd dose of BMZ with a blood pressure check and her blood pressure was elevated with decrease in fetal movement so pt was sent back to MAU for re-evaluation.

## 2012-11-19 ENCOUNTER — Encounter: Payer: Managed Care, Other (non HMO) | Admitting: Obstetrics & Gynecology

## 2012-11-21 NOTE — Progress Notes (Signed)
Obstetric ultrasound performed.   Study limited to evaluation of BPP.    Fetal heart rate tracing is also reported to be reactive.    [place] IUP at 33 weeks 4 days Normal amniotic fluid volume Reassuring BPP Fundal placenta without sonographic abnormalities Cephalic presentation  If blood pressures remain elevated or a diagnosis of preeclampsia is confirmed, evaluation of fetal growth by ultrasound and antenatal testing is recommended.    Please see full report in [facility].

## 2012-11-22 ENCOUNTER — Inpatient Hospital Stay (HOSPITAL_COMMUNITY)
Admission: AD | Admit: 2012-11-22 | Discharge: 2012-11-23 | Disposition: A | Discharge status: Home / Self Care | Payer: Managed Care, Other (non HMO) | Source: Ambulatory Visit | Attending: Obstetrics & Gynecology | Admitting: Obstetrics & Gynecology

## 2012-11-22 DIAGNOSIS — O2343 Unspecified infection of urinary tract in pregnancy, third trimester: Secondary | ICD-10-CM

## 2012-11-22 DIAGNOSIS — O09293 Supervision of pregnancy with other poor reproductive or obstetric history, third trimester: Secondary | ICD-10-CM

## 2012-11-22 DIAGNOSIS — B951 Streptococcus, group B, as the cause of diseases classified elsewhere: Secondary | ICD-10-CM

## 2012-11-22 DIAGNOSIS — O479 False labor, unspecified: Secondary | ICD-10-CM

## 2012-11-22 DIAGNOSIS — O26893 Other specified pregnancy related conditions, third trimester: Secondary | ICD-10-CM

## 2012-11-22 DIAGNOSIS — R030 Elevated blood-pressure reading, without diagnosis of hypertension: Secondary | ICD-10-CM | POA: Insufficient documentation

## 2012-11-22 DIAGNOSIS — O212 Late vomiting of pregnancy: Secondary | ICD-10-CM | POA: Insufficient documentation

## 2012-11-22 DIAGNOSIS — O34219 Maternal care for unspecified type scar from previous cesarean delivery: Secondary | ICD-10-CM

## 2012-11-22 DIAGNOSIS — Z3483 Encounter for supervision of other normal pregnancy, third trimester: Secondary | ICD-10-CM

## 2012-11-22 DIAGNOSIS — O4700 False labor before 37 completed weeks of gestation, unspecified trimester: Secondary | ICD-10-CM | POA: Insufficient documentation

## 2012-11-22 DIAGNOSIS — R519 Headache, unspecified: Secondary | ICD-10-CM

## 2012-11-23 ENCOUNTER — Encounter (HOSPITAL_COMMUNITY): Payer: Self-pay | Admitting: *Deleted

## 2012-11-23 LAB — CBC
HCT: 37.5 % (ref 36.0–46.0)
MCH: 28.4 pg (ref 26.0–34.0)
MCV: 83.9 fL (ref 78.0–100.0)
Platelets: 257 10*3/uL (ref 150–400)
RBC: 4.47 MIL/uL (ref 3.87–5.11)
RDW: 13.7 % (ref 11.5–15.5)

## 2012-11-23 LAB — COMPREHENSIVE METABOLIC PANEL
AST: 17 U/L (ref 0–37)
BUN: 7 mg/dL (ref 6–23)
CO2: 26 mEq/L (ref 19–32)
Calcium: 9.8 mg/dL (ref 8.4–10.5)
Chloride: 99 mEq/L (ref 96–112)
Creatinine, Ser: 0.54 mg/dL (ref 0.50–1.10)
GFR calc non Af Amer: >90 mL/min (ref >90)
Total Bilirubin: 0.2 mg/dL — ABNORMAL LOW (ref 0.3–1.2)

## 2012-11-23 LAB — PROTEIN / CREATININE RATIO, URINE
Creatinine, Urine: 57.98 mg/dL
Protein Creatinine Ratio: 0.08 (ref 0.00–0.15)
Total Protein, Urine: 4.5 mg/dL

## 2012-11-23 LAB — URINALYSIS, ROUTINE W REFLEX MICROSCOPIC
Bilirubin Urine: NEGATIVE
Hgb urine dipstick: NEGATIVE
Ketones, ur: 15 mg/dL — AB
Nitrite: NEGATIVE
Protein, ur: NEGATIVE mg/dL
Specific Gravity, Urine: 1.015 (ref 1.005–1.030)
Urobilinogen, UA: 0.2 mg/dL (ref 0.0–1.0)

## 2012-11-23 MED ORDER — ONDANSETRON 8 MG PO TBDP
8.0000 mg | ORAL_TABLET | Freq: Once | ORAL | Status: AC
  Administered 2012-11-23: 8 mg via ORAL
  Filled 2012-11-23: qty 1

## 2012-11-23 NOTE — MAU Note (Signed)
Has been seen over last few days for elevated b/p, today elevated at home, contractions, lower back pain, nausea.

## 2012-11-23 NOTE — MAU Provider Note (Signed)
Attestation of Attending Supervision of Obstetric Fellow: Evaluation and management procedures were performed by the Obstetric Fellow under my supervision and collaboration.  I have reviewed the Obstetric Fellow's note and chart, and I agree with the management and plan.  Bear Osten, MD, FACOG Attending Obstetrician & Gynecologist Faculty Practice, [facility]   

## 2012-11-23 NOTE — MAU Provider Note (Signed)
History     CSN: [id number]  Arrival date and time: 11/22/12 2359   None     Chief Complaint  Patient presents with  . Contractions  . Nausea   HPI 32 y.o. G2P0101 at [redacted]w[redacted]d presents for contractions and nausea. Has recent visits to MAU for pre-eclampsia workup, all negative and 24hr urine on 8/6 protein was 103. Measured BPs at home tonight 150/105, 140/105, 145/101. Reports contractions and back pain since around 2pm, every 15 minutes but increased in frequency to every 7-8, feels constant lower abdominal pressure. Complains of nausea for the past 3 days and vomiting at night, last vomiting episode was 2 nights ago. Has been able to keep most fluids and food down, drinking about 5 bottles of water a day. Also had some right shoulder pain today that comes and goes, lasts 5 minutes, feels like an ache, not currently there. Continues to have her constant headache that no medications have been able to help yet. Denies chest pain, shortness of breath, dizziness, changes in vision. No chills but has noticed feeling warm lately, including waking up sweaty despite air conditioning and no blankets over her.  Prenatal course: Care at [facility] - Normal NT, MSAFP - Normal anatomy - Normal GTT 1hr 109  OB History   Grav Para Term Preterm Abortions TAB SAB Ect Mult Living   2 1 0 1      1      Past Medical History  Diagnosis Date  . Anxiety   . Pregnancy induced hypertension   . Abnormal Pap smear   . Depression   . Preterm labor   . Headache(784.0)     migranes.  . Ovarian cyst     Past Surgical History  Procedure Laterality Date  . Colposcopy vulva w/ biopsy      abnormal pap  . Cesarean section    . Colonoscopy      2009  . Cholecystectomy      2009  . Wisdom tooth extraction      x4    Family History  Problem Relation Age of Onset  . Hypertension Mother   . Heart disease Father   . Hypertension Father   . Hyperlipidemia Father   . Depression Brother   .  Multiple sclerosis Maternal Grandmother   . Heart disease Maternal Grandfather   . Heart disease Paternal Grandmother   . Parkinson's disease Paternal Grandfather     History  Substance Use Topics  . Smoking status: Never Smoker   . Smokeless tobacco: Not on file  . Alcohol Use: Yes     Comment: rarely    Allergies:  Allergies  Allergen Reactions  . Latex Rash    Prescriptions prior to admission  Medication Sig Dispense Refill  . butalbital-acetaminophen-caffeine (FIORICET, ESGIC) 50-325-40 MG per tablet Take 1-2 tablets by mouth every 4 (four) hours as needed.  30 tablet  1  . cyclobenzaprine (FLEXERIL) 10 MG tablet Take 1 tablet (10 mg total) by mouth 3 (three) times daily as needed for muscle spasms.  30 tablet  1  . HYDROcodone-acetaminophen (NORCO/VICODIN) 5-325 MG per tablet Take 1-2 tablets by mouth every 6 (six) hours as needed for pain.  30 tablet  0  . Prenatal Vit-Fe Fumarate-FA (PRENATAL MULTIVITAMIN) TABS tablet Take 1 tablet by mouth at bedtime.      . promethazine (PHENERGAN) 25 MG tablet Take 1 tablet (25 mg total) by mouth every 6 (six) hours as needed for nausea.  30  tablet  3  . SUMAtriptan (IMITREX) 100 MG tablet Take 1 tablet (100 mg total) by mouth once as needed for migraine.  9 tablet  11  . acetaminophen (TYLENOL) 325 MG tablet Take 650 mg by mouth every 6 (six) hours as needed for pain.        ROS negative except as above Physical Exam   Blood pressure 127/84, pulse 88, temperature 98 F (36.7 C), temperature source Oral, resp. rate 20, height 5\' 3"  (1.6 m), weight 84.823 kg (187 lb), last menstrual period 03/28/2012, SpO2 98.00%.  Physical Exam General appearance: alert, cooperative and no distress Head: Normocephalic, without obvious abnormality, atraumatic Lungs: clear to auscultation bilaterally Heart: regular rate and rhythm, S1, S2 normal, no murmur, click, rub or gallop Abdomen: gravid, tender to palpation elicits sharp pain on mid-right side  with rebound,  Extremities: trace edema bilaterally, no tenderness in calves Pulses: 2+ and symmetric DP Skin: warm and dry   FHT: 130bpm, mod var, 15x15 accels present, no decels Toco: irritability, 1-2 contractions over 2 hours  MAU Course  Procedures Results for orders placed during the hospital encounter of 11/22/12 (from the past 24 hour(s))  PROTEIN / CREATININE RATIO, URINE     Status: None   Collection Time    11/23/12 12:11 AM      Result Value Range   Creatinine, Urine 57.98     Total Protein, Urine 4.5     PROTEIN CREATININE RATIO 0.08  0.00 - 0.15  URINALYSIS, ROUTINE W REFLEX MICROSCOPIC     Status: Abnormal   Collection Time    11/23/12 12:11 AM      Result Value Range   Color, Urine YELLOW  YELLOW   APPearance HAZY (*) CLEAR   Specific Gravity, Urine 1.015  1.005 - 1.030   pH 7.0  5.0 - 8.0   Glucose, UA NEGATIVE  NEGATIVE mg/dL   Hgb urine dipstick NEGATIVE  NEGATIVE   Bilirubin Urine NEGATIVE  NEGATIVE   Ketones, ur 15 (*) NEGATIVE mg/dL   Protein, ur NEGATIVE  NEGATIVE mg/dL   Urobilinogen, UA 0.2  0.0 - 1.0 mg/dL   Nitrite NEGATIVE  NEGATIVE   Leukocytes, UA SMALL (*) NEGATIVE  URINE MICROSCOPIC-ADD ON     Status: Abnormal   Collection Time    11/23/12 12:11 AM      Result Value Range   Squamous Epithelial / LPF FEW (*) RARE   WBC, UA 3-6  <3 WBC/hpf   Bacteria, UA FEW (*) RARE   Urine-Other MUCOUS PRESENT    CBC     Status: Abnormal   Collection Time    11/23/12 12:43 AM      Result Value Range   WBC 13.3 (*) 4.0 - 10.5 K/uL   RBC 4.47  3.87 - 5.11 MIL/uL   Hemoglobin 12.7  12.0 - 15.0 g/dL   HCT 14.7  82.9 - 56.2 %   MCV 83.9  78.0 - 100.0 fL   MCH 28.4  26.0 - 34.0 pg   MCHC 33.9  30.0 - 36.0 g/dL   RDW 13.0  86.5 - 78.4 %   Platelets 257  150 - 400 K/uL  COMPREHENSIVE METABOLIC PANEL     Status: Abnormal   Collection Time    11/23/12 12:43 AM      Result Value Range   Sodium 134 (*) 135 - 145 mEq/L   Potassium 4.1  3.5 - 5.1 mEq/L    Chloride 99  96 - 112  mEq/L   CO2 26  19 - 32 mEq/L   Glucose, Bld 94  70 - 99 mg/dL   BUN 7  6 - 23 mg/dL   Creatinine, Ser 9.60  0.50 - 1.10 mg/dL   Calcium 9.8  8.4 - 45.4 mg/dL   Total Protein 6.0  6.0 - 8.3 g/dL   Albumin 2.6 (*) 3.5 - 5.2 g/dL   AST 17  0 - 37 U/L   ALT 17  0 - 35 U/L   Alkaline Phosphatase 134 (*) 39 - 117 U/L   Total Bilirubin 0.2 (*) 0.3 - 1.2 mg/dL   GFR calc non Af Amer >90  >90 mL/min   GFR calc Af Amer >90  >90 mL/min   MDM  Assessment and Plan  31 y.o. G2P0101 at [redacted]w[redacted]d   PIH labs normal Pressures normal range in MAU UA normal FWB: reassuring, Cat I Stable for discharge  Tawni Carnes 11/23/2012, 1:55 AM   I have seen and examined this patient and agree with above documentation in the resident's note. Pt has been seen multiple times in the last few weeks for elevated pressures which she checks at home.  Here in MAU all have been normal to slightly high.  PIH labs were normal. She did today complain of some nausea and vomiting and had a slight  leukocytosis to 13.  We discussed this could be demarginalization as she has no other symptoms. Advised her to keep an eye on things and should the belly pain worsen or change or new symptoms develop, advised to let her doctors know.  FWB was cat I and SVE not consistent with labor.   Rulon Abide, M.D. [facility] Fellow 11/23/2012 6:40 AM

## 2012-11-24 ENCOUNTER — Encounter: Payer: Self-pay | Admitting: Obstetrics and Gynecology

## 2012-11-24 ENCOUNTER — Ambulatory Visit (INDEPENDENT_AMBULATORY_CARE_PROVIDER_SITE_OTHER): Payer: Managed Care, Other (non HMO) | Admitting: Obstetrics and Gynecology

## 2012-11-24 VITALS — BP 125/93 | Wt 187.0 lb

## 2012-11-24 DIAGNOSIS — O09293 Supervision of pregnancy with other poor reproductive or obstetric history, third trimester: Secondary | ICD-10-CM

## 2012-11-24 DIAGNOSIS — O9989 Other specified diseases and conditions complicating pregnancy, childbirth and the puerperium: Secondary | ICD-10-CM

## 2012-11-24 DIAGNOSIS — B951 Streptococcus, group B, as the cause of diseases classified elsewhere: Secondary | ICD-10-CM

## 2012-11-24 DIAGNOSIS — Z3480 Encounter for supervision of other normal pregnancy, unspecified trimester: Secondary | ICD-10-CM

## 2012-11-24 DIAGNOSIS — R519 Headache, unspecified: Secondary | ICD-10-CM

## 2012-11-24 DIAGNOSIS — O26893 Other specified pregnancy related conditions, third trimester: Secondary | ICD-10-CM

## 2012-11-24 DIAGNOSIS — O2390 Unspecified genitourinary tract infection in pregnancy, unspecified trimester: Secondary | ICD-10-CM

## 2012-11-24 DIAGNOSIS — O34219 Maternal care for unspecified type scar from previous cesarean delivery: Secondary | ICD-10-CM

## 2012-11-24 DIAGNOSIS — O2343 Unspecified infection of urinary tract in pregnancy, third trimester: Secondary | ICD-10-CM

## 2012-11-24 DIAGNOSIS — O09299 Supervision of pregnancy with other poor reproductive or obstetric history, unspecified trimester: Secondary | ICD-10-CM

## 2012-11-24 DIAGNOSIS — Z3483 Encounter for supervision of other normal pregnancy, third trimester: Secondary | ICD-10-CM

## 2012-11-24 LAB — URINE CULTURE: Colony Count: 15000

## 2012-11-24 NOTE — Progress Notes (Signed)
Patient reports doing well. Was seen in MAU yesterday secondary to elevated BP at home (150's/100's). PIH labs normal and P:C 0.08. FM/PTL/Preeclampsia precautions reviewed. Patient with irregular contractions and right sided abdominal pain (not RUQ) that is tender to deep palpation. Patient admits that it feels like an overworked muscle.

## 2012-11-24 NOTE — Progress Notes (Signed)
P-82  Patient has had a four pound weight loss since her last visit.  She continues to have contractions and is having right side mid abdominal pain that is painful to the touch.  Headache is still present but is not seeing spots right now.

## 2012-12-02 ENCOUNTER — Encounter: Payer: Self-pay | Admitting: Obstetrics & Gynecology

## 2012-12-02 ENCOUNTER — Ambulatory Visit (INDEPENDENT_AMBULATORY_CARE_PROVIDER_SITE_OTHER): Payer: Managed Care, Other (non HMO) | Admitting: Obstetrics & Gynecology

## 2012-12-02 VITALS — BP 131/93 | Wt 183.0 lb

## 2012-12-02 DIAGNOSIS — Z3480 Encounter for supervision of other normal pregnancy, unspecified trimester: Secondary | ICD-10-CM

## 2012-12-02 DIAGNOSIS — Z3483 Encounter for supervision of other normal pregnancy, third trimester: Secondary | ICD-10-CM

## 2012-12-02 LAB — CBC
Hemoglobin: 12.6 g/dL (ref 12.0–15.0)
MCH: 28.1 pg (ref 26.0–34.0)
MCHC: 32.4 g/dL (ref 30.0–36.0)
RDW: 13.9 % (ref 11.5–15.5)

## 2012-12-02 NOTE — Progress Notes (Signed)
Routine visit. Good FM. DTRs- 1+ bilaterally. Her visual changes are the same since July 3. She denies RUQ pain. She is GBS + and I will check GC and CT today. I will recheck her lab work. She is on bedrest and has no swelling. jShe ahs lost 4 # since last visit. Pre eclampsia precautions reviewed.

## 2012-12-02 NOTE — Progress Notes (Signed)
Patient reports she has been seeing spots for a couple days, yesterday was really bad, today just a little bit.  Swelling is better.  Nausea and heartburn is really bad.  No upper quadrant pain.  Contractions vary sometimes worse than others, the last 3-4 days they have been pretty strong but not close together.

## 2012-12-03 LAB — COMPREHENSIVE METABOLIC PANEL
AST: 17 U/L (ref 0–37)
Alkaline Phosphatase: 157 U/L — ABNORMAL HIGH (ref 39–117)
Glucose, Bld: 81 mg/dL (ref 70–99)
Potassium: 3.8 mEq/L (ref 3.5–5.3)
Sodium: 136 mEq/L (ref 135–145)
Total Bilirubin: 0.3 mg/dL (ref 0.3–1.2)
Total Protein: 5.7 g/dL — ABNORMAL LOW (ref 6.0–8.3)

## 2012-12-03 LAB — GC/CHLAMYDIA PROBE AMP, URINE: GC Probe Amp, Urine: NEGATIVE

## 2012-12-06 ENCOUNTER — Inpatient Hospital Stay (HOSPITAL_COMMUNITY)
Admission: AD | Admit: 2012-12-06 | Discharge: 2012-12-07 | Disposition: A | Discharge status: Home / Self Care | Payer: Managed Care, Other (non HMO) | Source: Ambulatory Visit | Attending: Obstetrics & Gynecology | Admitting: Obstetrics & Gynecology

## 2012-12-06 ENCOUNTER — Encounter (HOSPITAL_COMMUNITY): Payer: Self-pay | Admitting: *Deleted

## 2012-12-06 DIAGNOSIS — O99891 Other specified diseases and conditions complicating pregnancy: Secondary | ICD-10-CM | POA: Insufficient documentation

## 2012-12-06 DIAGNOSIS — O26893 Other specified pregnancy related conditions, third trimester: Secondary | ICD-10-CM

## 2012-12-06 DIAGNOSIS — IMO0001 Reserved for inherently not codable concepts without codable children: Secondary | ICD-10-CM

## 2012-12-06 DIAGNOSIS — R51 Headache: Secondary | ICD-10-CM | POA: Insufficient documentation

## 2012-12-06 DIAGNOSIS — R030 Elevated blood-pressure reading, without diagnosis of hypertension: Secondary | ICD-10-CM | POA: Insufficient documentation

## 2012-12-06 DIAGNOSIS — R519 Headache, unspecified: Secondary | ICD-10-CM

## 2012-12-06 MED ORDER — DEXAMETHASONE SODIUM PHOSPHATE 10 MG/ML IJ SOLN
10.0000 mg | Freq: Once | INTRAMUSCULAR | Status: AC
  Administered 2012-12-06: 10 mg via INTRAVENOUS
  Filled 2012-12-06: qty 1

## 2012-12-06 MED ORDER — SODIUM CHLORIDE 0.9 % IV SOLN
INTRAVENOUS | Status: DC
  Administered 2012-12-06: via INTRAVENOUS

## 2012-12-06 MED ORDER — DIPHENHYDRAMINE HCL 50 MG/ML IJ SOLN
25.0000 mg | Freq: Once | INTRAMUSCULAR | Status: AC
  Administered 2012-12-06: 25 mg via INTRAVENOUS
  Filled 2012-12-06: qty 1

## 2012-12-06 MED ORDER — ONDANSETRON HCL 4 MG/2ML IJ SOLN
4.0000 mg | Freq: Once | INTRAMUSCULAR | Status: AC
  Administered 2012-12-07: 4 mg via INTRAVENOUS
  Filled 2012-12-06: qty 2

## 2012-12-07 ENCOUNTER — Encounter: Payer: Self-pay | Admitting: Nurse Practitioner

## 2012-12-07 ENCOUNTER — Ambulatory Visit (INDEPENDENT_AMBULATORY_CARE_PROVIDER_SITE_OTHER): Payer: Managed Care, Other (non HMO) | Admitting: Nurse Practitioner

## 2012-12-07 ENCOUNTER — Encounter (HOSPITAL_COMMUNITY): Payer: Self-pay | Admitting: *Deleted

## 2012-12-07 VITALS — BP 128/94 | HR 98 | Ht 63.0 in | Wt 191.0 lb

## 2012-12-07 DIAGNOSIS — O34219 Maternal care for unspecified type scar from previous cesarean delivery: Secondary | ICD-10-CM

## 2012-12-07 DIAGNOSIS — O9989 Other specified diseases and conditions complicating pregnancy, childbirth and the puerperium: Secondary | ICD-10-CM

## 2012-12-07 DIAGNOSIS — Z3483 Encounter for supervision of other normal pregnancy, third trimester: Secondary | ICD-10-CM

## 2012-12-07 DIAGNOSIS — O09293 Supervision of pregnancy with other poor reproductive or obstetric history, third trimester: Secondary | ICD-10-CM

## 2012-12-07 DIAGNOSIS — G43709 Chronic migraine without aura, not intractable, without status migrainosus: Secondary | ICD-10-CM | POA: Insufficient documentation

## 2012-12-07 DIAGNOSIS — R519 Headache, unspecified: Secondary | ICD-10-CM

## 2012-12-07 DIAGNOSIS — O26893 Other specified pregnancy related conditions, third trimester: Secondary | ICD-10-CM

## 2012-12-07 DIAGNOSIS — R51 Headache: Secondary | ICD-10-CM

## 2012-12-07 DIAGNOSIS — B951 Streptococcus, group B, as the cause of diseases classified elsewhere: Secondary | ICD-10-CM

## 2012-12-07 LAB — COMPREHENSIVE METABOLIC PANEL
Alkaline Phosphatase: 182 U/L — ABNORMAL HIGH (ref 39–117)
BUN: 5 mg/dL — ABNORMAL LOW (ref 6–23)
Chloride: 105 mEq/L (ref 96–112)
Creatinine, Ser: 0.52 mg/dL (ref 0.50–1.10)
GFR calc Af Amer: >90 mL/min (ref >90)
Glucose, Bld: 99 mg/dL (ref 70–99)
Potassium: 4.4 mEq/L (ref 3.5–5.1)
Total Bilirubin: 0.2 mg/dL — ABNORMAL LOW (ref 0.3–1.2)

## 2012-12-07 LAB — CBC
HCT: 37.3 % (ref 36.0–46.0)
Hemoglobin: 12.4 g/dL (ref 12.0–15.0)
MCHC: 33.2 g/dL (ref 30.0–36.0)
MCV: 84.6 fL (ref 78.0–100.0)
WBC: 11 10*3/uL — ABNORMAL HIGH (ref 4.0–10.5)

## 2012-12-07 LAB — PROTEIN / CREATININE RATIO, URINE: Creatinine, Urine: 56.11 mg/dL

## 2012-12-07 LAB — URIC ACID: Uric Acid, Serum: 3.4 mg/dL (ref 2.4–7.0)

## 2012-12-07 MED ORDER — PROMETHAZINE HCL 25 MG/ML IJ SOLN: 12.5000 mg | Freq: Once | INTRAMUSCULAR | Status: DC

## 2012-12-07 MED ORDER — PROMETHAZINE HCL 25 MG/ML IJ SOLN
12.5000 mg | Freq: Once | INTRAMUSCULAR | Status: AC
  Administered 2012-12-07: 12.5 mg via INTRAMUSCULAR
  Filled 2012-12-07: qty 1

## 2012-12-07 MED ORDER — HYDROMORPHONE HCL PF 1 MG/ML IJ SOLN
1.0000 mg | Freq: Once | INTRAMUSCULAR | Status: AC
  Administered 2012-12-07: 1 mg via INTRAMUSCULAR
  Filled 2012-12-07: qty 1

## 2012-12-07 MED ORDER — HYDROMORPHONE HCL PF 1 MG/ML IJ SOLN: 1.0000 mg | Freq: Once | INTRAMUSCULAR | Status: DC

## 2012-12-07 NOTE — Progress Notes (Signed)
FHR is 140

## 2012-12-07 NOTE — Patient Instructions (Addendum)
Migraine Headache A migraine headache is an intense, throbbing pain on one or both sides of your head. A migraine can last for 30 minutes to several hours. CAUSES  The exact cause of a migraine headache is not always known. However, a migraine may be caused when nerves in the brain become irritated and release chemicals that cause inflammation. This causes pain. SYMPTOMS  Pain on one or both sides of your head.  Pulsating or throbbing pain.  Severe pain that prevents daily activities.  Pain that is aggravated by any physical activity.  Nausea, vomiting, or both.  Dizziness.  Pain with exposure to bright lights, loud noises, or activity.  General sensitivity to bright lights, loud noises, or smells. Before you get a migraine, you may get warning signs that a migraine is coming (aura). An aura may include:  Seeing flashing lights.  Seeing bright spots, halos, or zig-zag lines.  Having tunnel vision or blurred vision.  Having feelings of numbness or tingling.  Having trouble talking.  Having muscle weakness. MIGRAINE TRIGGERS  Alcohol.  Smoking.  Stress.  Menstruation.  Aged cheeses.  Foods or drinks that contain nitrates, glutamate, aspartame, or tyramine.  Lack of sleep.  Chocolate.  Caffeine.  Hunger.  Physical exertion.  Fatigue.  Medicines used to treat chest pain (nitroglycerine), birth control pills, estrogen, and some blood pressure medicines. DIAGNOSIS  A migraine headache is often diagnosed based on:  Symptoms.  Physical examination.  A CT scan or MRI of your head. TREATMENT Medicines may be given for pain and nausea. Medicines can also be given to help prevent recurrent migraines.  HOME CARE INSTRUCTIONS  Only take over-the-counter or prescription medicines for pain or discomfort as directed by your caregiver. The use of long-term narcotics is not recommended.  Lie down in a dark, quiet room when you have a migraine.  Keep a journal  to find out what may trigger your migraine headaches. For example, write down:  What you eat and drink.  How much sleep you get.  Any change to your diet or medicines.  Limit alcohol consumption.  Quit smoking if you smoke.  Get 7 to 9 hours of sleep, or as recommended by your caregiver.  Limit stress.  Keep lights dim if bright lights bother you and make your migraines worse. SEEK IMMEDIATE MEDICAL CARE IF:   Your migraine becomes severe.  You have a fever.  You have a stiff neck.  You have vision loss.  You have muscular weakness or loss of muscle control.  You start losing your balance or have trouble walking.  You feel faint or pass out.  You have severe symptoms that are different from your first symptoms. MAKE SURE YOU:   Understand these instructions.  Will watch your condition.  Will get help right away if you are not doing well or get worse. Document Released: 03/31/2005 Document Revised: 06/23/2011 Document Reviewed: 03/21/2011 [employer] Patient Information 2014 [employer].  

## 2012-12-07 NOTE — Progress Notes (Signed)
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 Her Mother has migraine as well. She has noticed having a daily migraine since early July. She has been given Fioricet and is now taking 6-8 per day. In addition Vicodin when the migraine gets really bad. She has taken Imitrex 3 doses and did not find that helpful. She has a history of anxiety and panic attacks. She is out of work now for pregnancy related issues. Her 3 YO daughter goes to daycare and she states she does not feel stressed. Her husband works full time and she denies any marital or financial issues. She has been to MAU multiple times with headaches including last night when she got headache cocktail to include Reglan, decadron, benadryl. She feels it was helpful for one hour and pain came back. She was then given Dilaudid and phenergan.    Location: Right and left temple and bilateral occipital  Number of Headache days/month: Severe:2 Moderate:20 Mild:daily  Current Outpatient Prescriptions on File Prior to Visit  Medication Sig Dispense Refill  . acetaminophen (TYLENOL) 500 MG tablet Take 500 mg by mouth every 6 (six) hours as needed for pain.      . butalbital-acetaminophen-caffeine (FIORICET, ESGIC) 50-325-40 MG per tablet Take 1-2 tablets by mouth every 4 (four) hours as needed.  30 tablet  1  . Prenatal Vit-Fe Fumarate-FA (PRENATAL MULTIVITAMIN) TABS tablet Take 1 tablet by mouth at bedtime.       No current facility-administered medications on file prior to visit.    Acute/ prevention: Has been taking 6-8 Fioricet per day. Daily Vicodin. Rare Imitrex. Had been on Topamax and Imitrex.  Past Medical History  Diagnosis Date  . Anxiety   . Pregnancy induced hypertension   . Abnormal Pap smear   . Preterm labor   . Headache(784.0)     migranes.  . Ovarian cyst   . UTI (lower urinary tract infection)     Past Surgical History  Procedure Laterality Date  . Colposcopy vulva w/ biopsy      abnormal pap  . Cesarean section    . Colonoscopy      2009  . Cholecystectomy      2009  . Wisdom tooth extraction      x4  . Wisdom tooth extraction    . Cryotherapy     Family History  Problem Relation Age of Onset  . Hypertension Mother   . Heart disease Father   . Hypertension Father   . Hyperlipidemia Father   . Depression Brother   . Multiple sclerosis Maternal Grandmother   . Heart disease Maternal Grandfather   . Heart disease Paternal Grandmother   . Parkinson's disease Paternal Grandfather    Social History:  reports that she has never smoked. She has never used smokeless tobacco. She reports that  drinks alcohol. She reports that she does not use illicit drugs. Allergies:  Allergies  Allergen Reactions  . Latex Rash    Triggers: Anxiety, Rebound from Fioricet  Birth control:Pregnant  JYN:[id number] for daily headache some are tension some are migraine, anxiety, panic attacks  Exam: Well developed well nourished 36 week and 2 days pregnant female  General: Tearful at times HEENT: Negative Cardiac: RRR Lungs:Clear Neuro: Negative Muscles: Tender at temples and occipital regions Skin: warm and dry  Impression:rebound headache due to excessive medication use, mixed tension and migraine headache  Plan: Discussed  the pathophysiology of migraine and the risks and benefits of medications in pregnancy. Pt did not use Imitrex daily and was advised not to follow that direction. She was given Trigger Point Injections today and had a good response. She is advised to taper down and off Fioricet. May use Imitrex as needed. Take phenergan nightly. Discussed ice packs and relaxation when she becomes anxious. She will return in one week for another set of trigger point injections.   Time Spent: 45 minutes

## 2012-12-07 NOTE — MAU Provider Note (Signed)
Attestation of Attending Supervision of Advanced Practitioner (CNM/NP): Evaluation and management procedures were performed by the Advanced Practitioner under my supervision and collaboration.  I have reviewed the Advanced Practitioner's note and chart, and I agree with the management and plan.  Jarious Lyon 12/07/2012 11:13 AM

## 2012-12-07 NOTE — MAU Provider Note (Signed)
History     CSN: [id number]  Arrival date and time: 12/06/12 2229   None     Chief Complaint  Patient presents with  . Headache   HPI  Alisha Hughes is a 31yo G2P0101 at 36.2wks presenting for eval of worsening H/A. States she has had an almost continuous H/A since July 3rd that at times is helped by po meds (Imitrex, Fiorcet), but not tonight. She does have a hx of migranes but had not had an occurrence since 6-8 mos prior to preg.  Denies any other new symptoms. No ctx, leaking or bldg. She is a pt at [facility] and has a hx of preeclampsia with her first preg requiring IOL at approx 36wks resulting in a a C/S delivery.  OB History   Grav Para Term Preterm Abortions TAB SAB Ect Mult Living   2 1 0 1      1      Past Medical History  Diagnosis Date  . Anxiety   . Pregnancy induced hypertension   . Abnormal Pap smear   . Preterm labor   . Headache(784.0)     migranes.  . Ovarian cyst   . UTI (lower urinary tract infection)     Past Surgical History  Procedure Laterality Date  . Colposcopy vulva w/ biopsy      abnormal pap  . Cesarean section    . Colonoscopy      2009  . Cholecystectomy      2009  . Wisdom tooth extraction      x4  . Wisdom tooth extraction    . Cryotherapy      Family History  Problem Relation Age of Onset  . Hypertension Mother   . Heart disease Father   . Hypertension Father   . Hyperlipidemia Father   . Depression Brother   . Multiple sclerosis Maternal Grandmother   . Heart disease Maternal Grandfather   . Heart disease Paternal Grandmother   . Parkinson's disease Paternal Grandfather     History  Substance Use Topics  . Smoking status: Never Smoker   . Smokeless tobacco: Not on file  . Alcohol Use: Yes     Comment: rarely    Allergies:  Allergies  Allergen Reactions  . Latex Rash    Prescriptions prior to admission  Medication Sig Dispense Refill  . acetaminophen (TYLENOL) 325 MG tablet Take 650 mg by mouth every 6 (six)  hours as needed for pain.      . butalbital-acetaminophen-caffeine (FIORICET, ESGIC) 50-325-40 MG per tablet Take 1-2 tablets by mouth every 4 (four) hours as needed.  30 tablet  1  . cyclobenzaprine (FLEXERIL) 10 MG tablet Take 1 tablet (10 mg total) by mouth 3 (three) times daily as needed for muscle spasms.  30 tablet  1  . HYDROcodone-acetaminophen (NORCO/VICODIN) 5-325 MG per tablet Take 1-2 tablets by mouth every 6 (six) hours as needed for pain.  30 tablet  0  . Prenatal Vit-Fe Fumarate-FA (PRENATAL MULTIVITAMIN) TABS tablet Take 1 tablet by mouth at bedtime.      . promethazine (PHENERGAN) 25 MG tablet Take 1 tablet (25 mg total) by mouth every 6 (six) hours as needed for nausea.  30 tablet  3  . SUMAtriptan (IMITREX) 100 MG tablet Take 1 tablet (100 mg total) by mouth once as needed for migraine.  9 tablet  11    ROS Physical Exam   Blood pressure 142/85, pulse 90, temperature 98.1 F (36.7 C), temperature  source Oral, resp. rate 16, height 5\' 3"  (1.6 m), weight 83.008 kg (183 lb), last menstrual period 03/28/2012, SpO2 100.00%.   Initial BPs: 143/96, 154/101  Later BPs: 145/90, 142/85  Physical Exam  Constitutional: She is oriented to person, place, and time. She appears well-developed.  HENT:  Head: Normocephalic.  Neck: Normal range of motion.  Cardiovascular: Normal rate.   Respiratory: Effort normal.  GI:  EFM 130s +accels, no decels Rare ctx per toco  Genitourinary:  Cx deferred  Musculoskeletal: Normal range of motion.  Neurological: She is alert and oriented to person, place, and time.  Skin: Skin is warm and dry.  Psychiatric: She has a normal mood and affect. Her behavior is normal. Thought content normal.   CBC    Component Value Date/Time   WBC 11.0* 12/07/2012 0045   RBC 4.41 12/07/2012 0045   HGB 12.4 12/07/2012 0045   HCT 37.3 12/07/2012 0045   PLT 206 12/07/2012 0045   MCV 84.6 12/07/2012 0045   MCH 28.1 12/07/2012 0045   MCHC 33.2 12/07/2012 0045   RDW  13.9 12/07/2012 0045   LYMPHSABS 2.5 06/19/2012 1922   MONOABS 1.3* 06/19/2012 1922   EOSABS 0.1 06/19/2012 1922   BASOSABS 0.0 06/19/2012 1922   CMP     Component Value Date/Time   NA 138 12/07/2012 0045   K 4.4 12/07/2012 0045   CL 105 12/07/2012 0045   CO2 24 12/07/2012 0045   GLUCOSE 99 12/07/2012 0045   BUN 5* 12/07/2012 0045   CREATININE 0.52 12/07/2012 0045   CREATININE 0.67 12/02/2012 1107   CALCIUM 9.3 12/07/2012 0045   PROT 5.6* 12/07/2012 0045   ALBUMIN 2.4* 12/07/2012 0045   AST 14 12/07/2012 0045   ALT 12 12/07/2012 0045   ALKPHOS 182* 12/07/2012 0045   BILITOT 0.2* 12/07/2012 0045   GFRNONAA >90 12/07/2012 0045   GFRAA >90 12/07/2012 0045   Urine P/C ratio: 0.09    MAU Course  Procedures  Headache 'cocktail' given including Decadron, Zofran and Benadryl. Began to get relief before it began again at which time she was given Dilaudid and Phen. This worked for a little over an hour before the pain began to return. Dilaudid/Phen repeated at approx 0645 while team works to determine plan of care.  Assessment and Plan  IUP at 36.2wks Hx migranes, worse during preg Labile BPs  Dr Erin Fulling consulted throughout pt's care; at this time she will speak with MFM to determine further plan of care.  Alisha Hughes 12/07/2012, 3:14 AM

## 2012-12-07 NOTE — MAU Provider Note (Signed)
History     CSN: [id number]  Arrival date and time: 12/06/12 2229   None     Chief Complaint  Patient presents with  . Headache   Headache     Alisha Hughes is a 31yo G2P0101 at 36.2wks presenting for eval of worsening H/A. States she has had an almost continuous H/A since July 3rd that at times is helped by po meds (Imitrex, Fiorcet), but not tonight. She does have a hx of migranes but had not had an occurrence since 6-8 mos prior to preg.  Denies any other new symptoms. No ctx, leaking or bldg. She is a pt at [facility] and has a hx of preeclampsia with her first preg requiring IOL at approx 36wks resulting in a a C/S delivery.  OB History   Grav Para Term Preterm Abortions TAB SAB Ect Mult Living   2 1 0 1      1      Past Medical History  Diagnosis Date  . Anxiety   . Pregnancy induced hypertension   . Abnormal Pap smear   . Preterm labor   . Headache(784.0)     migranes.  . Ovarian cyst   . UTI (lower urinary tract infection)     Past Surgical History  Procedure Laterality Date  . Colposcopy vulva w/ biopsy      abnormal pap  . Cesarean section    . Colonoscopy      2009  . Cholecystectomy      2009  . Wisdom tooth extraction      x4  . Wisdom tooth extraction    . Cryotherapy      Family History  Problem Relation Age of Onset  . Hypertension Mother   . Heart disease Father   . Hypertension Father   . Hyperlipidemia Father   . Depression Brother   . Multiple sclerosis Maternal Grandmother   . Heart disease Maternal Grandfather   . Heart disease Paternal Grandmother   . Parkinson's disease Paternal Grandfather     History  Substance Use Topics  . Smoking status: Never Smoker   . Smokeless tobacco: Not on file  . Alcohol Use: Yes     Comment: rarely    Allergies:  Allergies  Allergen Reactions  . Latex Rash    No prescriptions prior to admission    Review of Systems  Neurological: Positive for headaches.   Physical Exam   Blood  pressure 133/92, pulse 93, temperature 97.9 F (36.6 C), temperature source Oral, resp. rate 18, height 5\' 3"  (1.6 m), weight 83.008 kg (183 lb), last menstrual period 03/28/2012, SpO2 100.00%.   Initial BPs: 143/96, 154/101  Later BPs: 145/90, 142/85  Physical Exam  Constitutional: She is oriented to person, place, and time. She appears well-developed.  HENT:  Head: Normocephalic.  Neck: Normal range of motion.  Cardiovascular: Normal rate.   Respiratory: Effort normal.  GI:  EFM 130s +accels, no decels Rare ctx per toco  Genitourinary:  Cx deferred  Musculoskeletal: Normal range of motion.  Neurological: She is alert and oriented to person, place, and time.  Skin: Skin is warm and dry.  Psychiatric: She has a normal mood and affect. Her behavior is normal. Thought content normal.   CBC    Component Value Date/Time   WBC 11.0* 12/07/2012 0045   RBC 4.41 12/07/2012 0045   HGB 12.4 12/07/2012 0045   HCT 37.3 12/07/2012 0045   PLT 206 12/07/2012 0045   MCV 84.6  12/07/2012 0045   MCH 28.1 12/07/2012 0045   MCHC 33.2 12/07/2012 0045   RDW 13.9 12/07/2012 0045   LYMPHSABS 2.5 06/19/2012 1922   MONOABS 1.3* 06/19/2012 1922   EOSABS 0.1 06/19/2012 1922   BASOSABS 0.0 06/19/2012 1922   CMP     Component Value Date/Time   NA 138 12/07/2012 0045   K 4.4 12/07/2012 0045   CL 105 12/07/2012 0045   CO2 24 12/07/2012 0045   GLUCOSE 99 12/07/2012 0045   BUN 5* 12/07/2012 0045   CREATININE 0.52 12/07/2012 0045   CREATININE 0.67 12/02/2012 1107   CALCIUM 9.3 12/07/2012 0045   PROT 5.6* 12/07/2012 0045   ALBUMIN 2.4* 12/07/2012 0045   AST 14 12/07/2012 0045   ALT 12 12/07/2012 0045   ALKPHOS 182* 12/07/2012 0045   BILITOT 0.2* 12/07/2012 0045   GFRNONAA >90 12/07/2012 0045   GFRAA >90 12/07/2012 0045   Urine P/C ratio: 0.09    MAU Course  Procedures  Headache 'cocktail' given including Decadron, Zofran and Benadryl. Began to get relief before it began again at which time she was given Dilaudid and  Phen. This worked for a little over an hour before the pain began to return. Dilaudid/Phen repeated at approx 0645 while team works to determine plan of care.  Assessment and Plan  IUP at 36.2wks Hx migranes, worse during preg Labile BPs  Assumed care at 0845 1. Headache in pregnancy, antepartum, third trimester   2. Elevated BP   G2P0101 at [redacted]w[redacted]d Cat 1 FHR  Dr. Erin Fulling consulted MFM > recommendation discharge home with continuation of symptomatic tx for H/A. Offered pt to [facility] today to be worked in with Jannifer Rodney  NP today or to be seen later in the week. PreE precautions reviewed.      Alisha Hughes 12/07/2012, 9:31 AM

## 2012-12-09 ENCOUNTER — Encounter: Payer: Self-pay | Admitting: Obstetrics & Gynecology

## 2012-12-09 ENCOUNTER — Ambulatory Visit (INDEPENDENT_AMBULATORY_CARE_PROVIDER_SITE_OTHER): Payer: Managed Care, Other (non HMO) | Admitting: Obstetrics & Gynecology

## 2012-12-09 VITALS — BP 125/96 | Wt 190.0 lb

## 2012-12-09 DIAGNOSIS — Z3483 Encounter for supervision of other normal pregnancy, third trimester: Secondary | ICD-10-CM

## 2012-12-09 DIAGNOSIS — Z3480 Encounter for supervision of other normal pregnancy, unspecified trimester: Secondary | ICD-10-CM

## 2012-12-09 NOTE — Progress Notes (Signed)
P = 94 

## 2012-12-09 NOTE — Progress Notes (Signed)
Routine visit. Got trigger point injection 2 days ago and no headache yesterday. Feeling good FM and some vaginal pressure. No VB or ROM. She was at the MAU and labs and monitoring were good. Labor precautions/pre ecl precautions given. DTRs- 0.

## 2012-12-09 NOTE — MAU Provider Note (Signed)
Pt seen and examined.  MFM consulted as well.  No evidence of preeclampsia.  Pt with HA history.  Seen by headache specialist.  Will discharge pt to home and have her f/u closely for repeat BP checks.  She was counseled about the s/sx of preeclampsia. Attestation of Attending Supervision of Advanced Practitioner (CNM/NP): Evaluation and management procedures were performed by the Advanced Practitioner under my supervision and collaboration.  I have reviewed the Advanced Practitioner's note and chart, and I agree with the management and plan.  HARRAWAY-SMITH, Lidwina Kaner 7:40 AM

## 2012-12-10 ENCOUNTER — Inpatient Hospital Stay (HOSPITAL_COMMUNITY)
Admission: AD | Admit: 2012-12-10 | Discharge: 2012-12-11 | Disposition: A | Discharge status: Home / Self Care | Payer: Managed Care, Other (non HMO) | Source: Ambulatory Visit | Attending: Obstetrics & Gynecology | Admitting: Obstetrics & Gynecology

## 2012-12-10 ENCOUNTER — Encounter (HOSPITAL_COMMUNITY): Payer: Self-pay

## 2012-12-10 DIAGNOSIS — O163 Unspecified maternal hypertension, third trimester: Secondary | ICD-10-CM

## 2012-12-10 DIAGNOSIS — R51 Headache: Secondary | ICD-10-CM | POA: Insufficient documentation

## 2012-12-10 DIAGNOSIS — O99891 Other specified diseases and conditions complicating pregnancy: Secondary | ICD-10-CM | POA: Insufficient documentation

## 2012-12-10 DIAGNOSIS — R030 Elevated blood-pressure reading, without diagnosis of hypertension: Secondary | ICD-10-CM | POA: Insufficient documentation

## 2012-12-10 NOTE — MAU Note (Signed)
Patient is in with c/o elevated bp and headache. She states that she was advised to come to MAU. She denies visual disturbance, epigastric pain, vaginal bleeding or lof. She reports good fetal movement.

## 2012-12-11 DIAGNOSIS — O139 Gestational [pregnancy-induced] hypertension without significant proteinuria, unspecified trimester: Secondary | ICD-10-CM

## 2012-12-11 LAB — COMPREHENSIVE METABOLIC PANEL
CO2: 22 mEq/L (ref 19–32)
Calcium: 9.5 mg/dL (ref 8.4–10.5)
Creatinine, Ser: 0.5 mg/dL (ref 0.50–1.10)
GFR calc Af Amer: >90 mL/min (ref >90)
GFR calc non Af Amer: >90 mL/min (ref >90)
Glucose, Bld: 89 mg/dL (ref 70–99)
Sodium: 135 mEq/L (ref 135–145)
Total Protein: 6 g/dL (ref 6.0–8.3)

## 2012-12-11 LAB — URINALYSIS, ROUTINE W REFLEX MICROSCOPIC
Glucose, UA: NEGATIVE mg/dL
Ketones, ur: NEGATIVE mg/dL
Leukocytes, UA: NEGATIVE
Nitrite: NEGATIVE
Protein, ur: NEGATIVE mg/dL
pH: 6 (ref 5.0–8.0)

## 2012-12-11 LAB — URINE MICROSCOPIC-ADD ON

## 2012-12-11 LAB — CBC
HCT: 38.9 % (ref 36.0–46.0)
Hemoglobin: 13 g/dL (ref 12.0–15.0)
RBC: 4.58 MIL/uL (ref 3.87–5.11)
WBC: 10.4 10*3/uL (ref 4.0–10.5)

## 2012-12-11 LAB — PROTEIN / CREATININE RATIO, URINE: Creatinine, Urine: 25.59 mg/dL

## 2012-12-11 MED ORDER — BUTALBITAL-APAP-CAFFEINE 50-325-40 MG PO TABS
1.0000 | ORAL_TABLET | Freq: Once | ORAL | Status: AC
  Administered 2012-12-11: 1 via ORAL
  Filled 2012-12-11: qty 1

## 2012-12-11 NOTE — MAU Provider Note (Signed)
History     CSN: [id number]  Arrival date and time: 12/10/12 2331   First Provider Initiated Contact with Patient 12/11/12 0222      Chief Complaint  Patient presents with  . Hypertension  . Headache   HPI  Alisha Hughes is a 32 y.o. G2P0101 at [redacted]w[redacted]d who presents complaining of elevated blood pressures at home. She notes her BP has been as high as 156/115 at home today. She has been checking her BP hourly. She does endorse headache (has had chronic HA during this pregnancy), blurry vision earlier today, increased swelling, and RUQ pain. Denies vaginal bleeding. Has had occasional ctx. Good fetal movement. She does endorse occasional small gushes of fluid from her vagina but this has happened at most once per day for the last week, and doesn't happen every day. It is not a continuous flow of fluid.   OB History   Grav Para Term Preterm Abortions TAB SAB Ect Mult Living   2 1 0 1      1      Past Medical History  Diagnosis Date  . Anxiety   . Pregnancy induced hypertension   . Abnormal Pap smear   . Preterm labor   . Headache(784.0)     migranes.  . Ovarian cyst   . UTI (lower urinary tract infection)     Past Surgical History  Procedure Laterality Date  . Colposcopy vulva w/ biopsy      abnormal pap  . Cesarean section    . Colonoscopy      2009  . Cholecystectomy      2009  . Wisdom tooth extraction      x4  . Wisdom tooth extraction    . Cryotherapy      Family History  Problem Relation Age of Onset  . Hypertension Mother   . Heart disease Father   . Hypertension Father   . Hyperlipidemia Father   . Depression Brother   . Multiple sclerosis Maternal Grandmother   . Heart disease Maternal Grandfather   . Heart disease Paternal Grandmother   . Parkinson's disease Paternal Grandfather     History  Substance Use Topics  . Smoking status: Never Smoker   . Smokeless tobacco: Never Used  . Alcohol Use: Yes     Comment: rarely    Allergies:  Allergies   Allergen Reactions  . Latex Rash    Prescriptions prior to admission  Medication Sig Dispense Refill  . acetaminophen (TYLENOL) 500 MG tablet Take 500 mg by mouth every 6 (six) hours as needed for pain.      . butalbital-acetaminophen-caffeine (FIORICET, ESGIC) 50-325-40 MG per tablet Take 1-2 tablets by mouth every 4 (four) hours as needed.  30 tablet  1  . cyclobenzaprine (FLEXERIL) 10 MG tablet       . Prenatal Vit-Fe Fumarate-FA (PRENATAL MULTIVITAMIN) TABS tablet Take 1 tablet by mouth at bedtime.      . promethazine (PHENERGAN) 25 MG tablet       . SUMAtriptan (IMITREX) 100 MG tablet         ROS  +FM, occasional ctx (not painful), no vaginal bleeding, no persistent gushing of fluid  Physical Exam   Blood pressure 132/95, pulse 92, temperature 98.1 F (36.7 C), temperature source Oral, resp. rate 18, last menstrual period 03/28/2012, SpO2 98.00%.  Physical Exam Gen: NAD Heart: RRR Lungs: CTAB, NWOB Abd: gravid but otherwise soft, nontender to palpation Ext: mild lower extremity edema bilaterally, no pitting  Neuro: grossly nonfocal, speech intact. No clonus. Minimal patellar reflexes bilaterally  MAU Course  Procedures  MDM CBC:    Component Value Date/Time   WBC 10.4 12/11/2012 0020   HGB 13.0 12/11/2012 0020   HCT 38.9 12/11/2012 0020   PLT 244 12/11/2012 0020   MCV 84.9 12/11/2012 0020   NEUTROABS 6.3 06/19/2012 1922   LYMPHSABS 2.5 06/19/2012 1922   MONOABS 1.3* 06/19/2012 1922   EOSABS 0.1 06/19/2012 1922   BASOSABS 0.0 06/19/2012 1922   Comprehensive Metabolic Panel:    Component Value Date/Time   NA 135 12/11/2012 0020   K 3.9 12/11/2012 0020   CL 101 12/11/2012 0020   CO2 22 12/11/2012 0020   BUN 6 12/11/2012 0020   CREATININE 0.50 12/11/2012 0020   CREATININE 0.67 12/02/2012 1107   GLUCOSE 89 12/11/2012 0020   CALCIUM 9.5 12/11/2012 0020   AST 19 12/11/2012 0020   ALT 16 12/11/2012 0020   ALKPHOS 179* 12/11/2012 0020   BILITOT 0.2* 12/11/2012 0020   PROT 6.0  12/11/2012 0020   ALBUMIN 2.4* 12/11/2012 0020     Protein:Creatinine ratio: unable to calculate (lower than lowest reportable value)  Assessment and Plan  Alisha Hughes is a 32 y.o. G2P0101 at [redacted]w[redacted]d who presents with elevated BP's at home, and reporting symptoms concerning for pre-eclampsia, however her labwork all remains normal and she is not having any severe range blood pressures. She is stable for discharge to home at this time, with pre-eclampsia return precautions.  Levert Feinstein 12/11/2012, 2:33 AM   I have seen and examined this patient and agree with above documentation in the resident's note. Pt with multiple MAU admissions after having elevated bps at home associated with headaches and edema.  Upon arrival here, only two elevated blood pressures and all else wnl. PIH labs normal. Reassurance given and recommendation to take her cuff to her doctor's office for calibration. FWB cat I  Rulon Abide, M.D. [facility] Fellow 12/11/2012 8:43 AM

## 2012-12-11 NOTE — Progress Notes (Signed)
Dr Pollie Meyer notified of patient, her c/o of headache and elevated bp, previous history of preeclampsia, plan for repeat c-section, bp reading today.

## 2012-12-14 ENCOUNTER — Encounter (HOSPITAL_COMMUNITY): Payer: Self-pay | Admitting: Pharmacist

## 2012-12-14 ENCOUNTER — Encounter: Payer: Managed Care, Other (non HMO) | Admitting: Nurse Practitioner

## 2012-12-14 ENCOUNTER — Encounter: Payer: Self-pay | Admitting: Nurse Practitioner

## 2012-12-14 ENCOUNTER — Ambulatory Visit (INDEPENDENT_AMBULATORY_CARE_PROVIDER_SITE_OTHER): Payer: Managed Care, Other (non HMO) | Admitting: Nurse Practitioner

## 2012-12-14 VITALS — BP 129/99 | Wt 189.0 lb

## 2012-12-14 DIAGNOSIS — Z3483 Encounter for supervision of other normal pregnancy, third trimester: Secondary | ICD-10-CM

## 2012-12-14 DIAGNOSIS — Z3480 Encounter for supervision of other normal pregnancy, unspecified trimester: Secondary | ICD-10-CM

## 2012-12-14 DIAGNOSIS — R519 Headache, unspecified: Secondary | ICD-10-CM

## 2012-12-14 DIAGNOSIS — O26893 Other specified pregnancy related conditions, third trimester: Secondary | ICD-10-CM

## 2012-12-14 DIAGNOSIS — G43709 Chronic migraine without aura, not intractable, without status migrainosus: Secondary | ICD-10-CM

## 2012-12-14 DIAGNOSIS — O9989 Other specified diseases and conditions complicating pregnancy, childbirth and the puerperium: Secondary | ICD-10-CM

## 2012-12-14 NOTE — Progress Notes (Signed)
P = 98 

## 2012-12-14 NOTE — Patient Instructions (Addendum)
Advised to continue to monitor BP and go to MAU per BP protocol.

## 2012-12-14 NOTE — Progress Notes (Signed)
Pt had Trigger point injections last Tuesday and since then she only has taken 2 tylenol and 2 Fioricet. She denies any headache now and declines another Trigger point injection. She is planned C/S on Sept 15th with BTL.

## 2012-12-14 NOTE — Progress Notes (Signed)
Pt was seen last week for migraine management. She was discovered to be taking 6-8 Fioricet per day. She was given Trigger Point Injections to help her break the pain cycle and she returns today saying she has only taken 2 tylenol and 2 Fioricet since last week. She now has no headache. She declines a Trigger Point Injection today as she has no pain. She is still concerned about elevated BP. Was seen again in MAU for [facility] workup on Friday night. All labs were normal. There is no protein in urine today. She is scheduled for C/S on Sept 15th and has a planned BTL at that time.

## 2012-12-15 ENCOUNTER — Other Ambulatory Visit (INDEPENDENT_AMBULATORY_CARE_PROVIDER_SITE_OTHER): Payer: Managed Care, Other (non HMO) | Admitting: *Deleted

## 2012-12-15 VITALS — BP 131/97

## 2012-12-15 DIAGNOSIS — O368130 Decreased fetal movements, third trimester, not applicable or unspecified: Secondary | ICD-10-CM

## 2012-12-15 DIAGNOSIS — O09299 Supervision of pregnancy with other poor reproductive or obstetric history, unspecified trimester: Secondary | ICD-10-CM

## 2012-12-15 DIAGNOSIS — O09293 Supervision of pregnancy with other poor reproductive or obstetric history, third trimester: Secondary | ICD-10-CM

## 2012-12-15 DIAGNOSIS — O368190 Decreased fetal movements, unspecified trimester, not applicable or unspecified: Secondary | ICD-10-CM

## 2012-12-15 NOTE — Progress Notes (Signed)
Pt came in today because she said she has not felt her baby move all day.  We put her on the NST and the baby was very reactive and moving well.  Pt did feel the baby move at that time.  Pt was having some lower pelvic pain also at the time.  Pt will follow up at her next appointment or go to the ER if things get worse.

## 2012-12-17 ENCOUNTER — Ambulatory Visit (INDEPENDENT_AMBULATORY_CARE_PROVIDER_SITE_OTHER): Payer: Managed Care, Other (non HMO) | Admitting: Family Medicine

## 2012-12-17 ENCOUNTER — Encounter: Payer: Self-pay | Admitting: Family Medicine

## 2012-12-17 VITALS — BP 129/97 | Wt 189.0 lb

## 2012-12-17 DIAGNOSIS — O139 Gestational [pregnancy-induced] hypertension without significant proteinuria, unspecified trimester: Secondary | ICD-10-CM

## 2012-12-17 DIAGNOSIS — Z3480 Encounter for supervision of other normal pregnancy, unspecified trimester: Secondary | ICD-10-CM

## 2012-12-17 DIAGNOSIS — O09293 Supervision of pregnancy with other poor reproductive or obstetric history, third trimester: Secondary | ICD-10-CM

## 2012-12-17 LAB — COMPREHENSIVE METABOLIC PANEL
ALT: 12 U/L (ref 0–35)
AST: 18 U/L (ref 0–37)
CO2: 23 mEq/L (ref 19–32)
Calcium: 9.1 mg/dL (ref 8.4–10.5)
Chloride: 105 mEq/L (ref 96–112)
Creat: 0.52 mg/dL (ref 0.50–1.10)
Potassium: 4.2 mEq/L (ref 3.5–5.3)
Sodium: 136 mEq/L (ref 135–145)
Total Protein: 5.6 g/dL — ABNORMAL LOW (ref 6.0–8.3)

## 2012-12-17 NOTE — Progress Notes (Signed)
BP up a good bit last pm 174/130, then quickly back down.  For RCS and BTL in 10 days.  Check labs today. Manual re-check BP 142/94 for NST--? CHTN BP definitely borderline in early pregnancy NST reviewed and reactive.

## 2012-12-17 NOTE — Progress Notes (Signed)
P=106 

## 2012-12-17 NOTE — Progress Notes (Signed)
BP recheck manual 140/110.

## 2012-12-17 NOTE — Patient Instructions (Signed)
Preeclampsia and Eclampsia Preeclampsia is a condition of high blood pressure during pregnancy. It can happen at 20 weeks or later in pregnancy. If high blood pressure occurs in the second half of pregnancy with no other symptoms, it is called gestational hypertension and goes away after the baby is born. If any of the symptoms listed below develop with gestational hypertension, it is then called preeclampsia. Eclampsia (convulsions) may follow preeclampsia. This is one of the reasons for regular prenatal checkups. Early diagnosis and treatment are very important to prevent eclampsia. CAUSES  There is no known cause of preeclampsia/eclampsia in pregnancy. There are several known conditions that may put the pregnant woman at risk, such as:  The first pregnancy.  Having preeclampsia in a past pregnancy.  Having lasting (chronic) high blood pressure.  Having multiples (twins, triplets).  Being age 35 or older.  African American ethnic background.  Having kidney disease or diabetes.  Medical conditions such as lupus or blood diseases.  Being overweight (obese). SYMPTOMS   High blood pressure.  Headaches.  Sudden weight gain.  Swelling of hands, face, legs, and feet.  Protein in the urine.  Feeling sick to your stomach (nauseous) and throwing up (vomiting).  Vision problems (blurred or double vision).  Numbness in the face, arms, legs, and feet.  Dizziness.  Slurred speech.  Preeclampsia can cause growth retardation in the fetus.  Separation (abruption) of the placenta.  Not enough fluid in the amniotic sac (oligohydramnios).  Sensitivity to bright lights.  Belly (abdominal) pain. DIAGNOSIS  If protein is found in the urine in the second half of pregnancy, this is considered preeclampsia. Other symptoms mentioned above may also be present. TREATMENT  It is necessary to treat this.  Your caregiver may prescribe bed rest early in this condition. Plenty of rest and  salt restriction may be all that is needed.  Medicines may be necessary to lower blood pressure if the condition does not respond to more conservative measures.  In more severe cases, hospitalization may be needed:  For treatment of blood pressure.  To control fluid retention.  To monitor the baby to see if the condition is causing harm to the baby.  Hospitalization is the best way to treat the first sign of preeclampsia. This is so the mother and baby can be watched closely and blood tests can be done effectively and correctly.  If the condition becomes severe, it may be necessary to induce labor or to remove the infant by surgical means (cesarean section). The best cure for preeclampsia/eclampsia is to deliver the baby. Preeclampsia and eclampsia involve risks to mother and infant. Your caregiver will discuss these risks with you. Together, you can work out the best possible approach to your problems. Make sure you keep your prenatal visits as scheduled. Not keeping appointments could result in a chronic or permanent injury, pain, disability to you, and death or injury to you or your unborn baby. If there is any problem keeping the appointment, you must call to reschedule. HOME CARE INSTRUCTIONS   Keep your prenatal appointments and tests as scheduled.  Tell your caregiver if you have any of the above risk factors.  Get plenty of rest and sleep.  Eat a balanced diet that is low in salt, and do not add salt to your food.  Avoid stressful situations.  Only take over-the-counter and prescriptions medicines for pain, discomfort, or fever as directed by your caregiver. SEEK IMMEDIATE MEDICAL CARE IF:   You develop severe swelling   anywhere in the body. This usually occurs in the legs.  You gain 5 lb/2.3 kg or more in a week.  You develop a severe headache, dizziness, problems with your vision, or confusion.  You have abdominal pain, nausea, or vomiting.  You have a seizure.  You  have trouble moving any part of your body, or you develop numbness or problems speaking.  You have bruising or abnormal bleeding from anywhere in the body.  You develop a stiff neck.  You pass out. MAKE SURE YOU:   Understand these instructions.  Will watch your condition.  Will get help right away if you are not doing well or get worse. Document Released: 03/28/2000 Document Revised: 06/23/2011 Document Reviewed: 11/12/2007 [employer] Patient Information 2014 [employer].  Pregnancy - Third Trimester The third trimester of pregnancy (the last 3 months) is a period of the most rapid growth for you and your baby. The baby approaches a length of 20 inches and a weight of 6 to 10 pounds. The baby is adding on fat and getting ready for life outside your body. While inside, babies have periods of sleeping and waking, sucking thumbs, and hiccuping. You can often feel small contractions of the uterus. This is false labor. It is also called Braxton-Hicks contractions. This is like a practice for labor. The usual problems in this stage of pregnancy include more difficulty breathing, swelling of the hands and feet from water retention, and having to urinate more often because of the uterus and baby pressing on your bladder.  PRENATAL EXAMS  Blood work may continue to be done during prenatal exams. These tests are done to check on your health and the probable health of your baby. Blood work is used to follow your blood levels (hemoglobin). Anemia (low hemoglobin) is common during pregnancy. Iron and vitamins are given to help prevent this. You may also continue to be checked for diabetes. Some of the past blood tests may be done again.  The size of the uterus is measured during each visit. This makes sure your baby is growing properly according to your pregnancy dates.  Your blood pressure is checked every prenatal visit. This is to make sure you are not getting toxemia.  Your urine is checked  every prenatal visit for infection, diabetes, and protein.  Your weight is checked at each visit. This is done to make sure gains are happening at the suggested rate and that you and your baby are growing normally.  Sometimes, an ultrasound is performed to confirm the position and the proper growth and development of the baby. This is a test done that bounces harmless sound waves off the baby so your caregiver can more accurately determine a due date.  Discuss the type of pain medicine and anesthesia you will have during your labor and delivery.  Discuss the possibility and anesthesia if a cesarean section might be necessary.  Inform your caregiver if there is any mental or physical violence at home. Sometimes, a specialized non-stress test, contraction stress test, and biophysical profile are done to make sure the baby is not having a problem. Checking the amniotic fluid surrounding the baby is called an amniocentesis. The amniotic fluid is removed by sticking a needle into the belly (abdomen). This is sometimes done near the end of pregnancy if an early delivery is required. In this case, it is done to help make sure the baby's lungs are mature enough for the baby to live outside of the womb. If the   lungs are not mature and it is unsafe to deliver the baby, an injection of cortisone medicine is given to the mother 1 to 2 days before the delivery. This helps the baby's lungs mature and makes it safer to deliver the baby. CHANGES OCCURING IN THE THIRD TRIMESTER OF PREGNANCY Your body goes through many changes during pregnancy. They vary from person to person. Talk to your caregiver about changes you notice and are concerned about.  During the last trimester, you have probably had an increase in your appetite. It is normal to have cravings for certain foods. This varies from person to person and pregnancy to pregnancy.  You may begin to get stretch marks on your hips, abdomen, and breasts. These are  normal changes in the body during pregnancy. There are no exercises or medicines to take which prevent this change.  Constipation may be treated with a stool softener or adding bulk to your diet. Drinking lots of fluids, fiber in vegetables, fruits, and whole grains are helpful.  Exercising is also helpful. If you have been very active up until your pregnancy, most of these activities can be continued during your pregnancy. If you have been less active, it is helpful to start an exercise program such as walking. Consult your caregiver before starting exercise programs.  Avoid all smoking, alcohol, non-prescribed drugs, herbs and "street drugs" during your pregnancy. These chemicals affect the formation and growth of the baby. Avoid chemicals throughout the pregnancy to ensure the delivery of a healthy infant.  Backache, varicose veins, and hemorrhoids may develop or get worse.  You will tire more easily in the third trimester, which is normal.  The baby's movements may be stronger and more often.  You may become short of breath easily.  Your belly button may stick out.  A yellow discharge may leak from your breasts called colostrum.  You may have a bloody mucus discharge. This usually occurs a few days to a week before labor begins. HOME CARE INSTRUCTIONS   Keep your caregiver's appointments. Follow your caregiver's instructions regarding medicine use, exercise, and diet.  During pregnancy, you are providing food for you and your baby. Continue to eat regular, well-balanced meals. Choose foods such as meat, fish, milk and other low fat dairy products, vegetables, fruits, and whole-grain breads and cereals. Your caregiver will tell you of the ideal weight gain.  A physical sexual relationship may be continued throughout pregnancy if there are no other problems such as early (premature) leaking of amniotic fluid from the membranes, vaginal bleeding, or belly (abdominal) pain.  Exercise  regularly if there are no restrictions. Check with your caregiver if you are unsure of the safety of your exercises. Greater weight gain will occur in the last 2 trimesters of pregnancy. Exercising helps:  Control your weight.  Get you in shape for labor and delivery.  You lose weight after you deliver.  Rest a lot with legs elevated, or as needed for leg cramps or low back pain.  Wear a good support or jogging bra for breast tenderness during pregnancy. This may help if worn during sleep. Pads or tissues may be used in the bra if you are leaking colostrum.  Do not use hot tubs, steam rooms, or saunas.  Wear your seat belt when driving. This protects you and your baby if you are in an accident.  Avoid raw meat, cat litter boxes and soil used by cats. These carry germs that can cause birth defects in the baby.    It is easier to leak urine during pregnancy. Tightening up and strengthening the pelvic muscles will help with this problem. You can practice stopping your urination while you are going to the bathroom. These are the same muscles you need to strengthen. It is also the muscles you would use if you were trying to stop from passing gas. You can practice tightening these muscles up 10 times a set and repeating this about 3 times per day. Once you know what muscles to tighten up, do not perform these exercises during urination. It is more likely to cause an infection by backing up the urine.  Ask for help if you have financial, counseling, or nutritional needs during pregnancy. Your caregiver will be able to offer counseling for these needs as well as refer you for other special needs.  Make a list of emergency phone numbers and have them available.  Plan on getting help from family or friends when you go home from the hospital.  Make a trial run to the hospital.  Take prenatal classes with the father to understand, practice, and ask questions about the labor and delivery.  Prepare the  baby's room or nursery.  Do not travel out of the city unless it is absolutely necessary and with the advice of your caregiver.  Wear only low or no heal shoes to have better balance and prevent falling. MEDICINES AND DRUG USE IN PREGNANCY  Take prenatal vitamins as directed. The vitamin should contain 1 milligram of folic acid. Keep all vitamins out of reach of children. Only a couple vitamins or tablets containing iron may be fatal to a baby or young child when ingested.  Avoid use of all medicines, including herbs, over-the-counter medicines, not prescribed or suggested by your caregiver. Only take over-the-counter or prescription medicines for pain, discomfort, or fever as directed by your caregiver. Do not use aspirin, ibuprofen or naproxen unless approved by your caregiver.  Let your caregiver also know about herbs you may be using.  Alcohol is related to a number of birth defects. This includes fetal alcohol syndrome. All alcohol, in any form, should be avoided completely. Smoking will cause low birth rate and premature babies.  Illegal drugs are very harmful to the baby. They are absolutely forbidden. A baby born to an addicted mother will be addicted at birth. The baby will go through the same withdrawal an adult does. SEEK MEDICAL CARE IF: You have any concerns or worries during your pregnancy. It is better to call with your questions if you feel they cannot wait, rather than worry about them. SEEK IMMEDIATE MEDICAL CARE IF:   An unexplained oral temperature above 102 F (38.9 C) develops, or as your caregiver suggests.  You have leaking of fluid from the vagina. If leaking membranes are suspected, take your temperature and tell your caregiver of this when you call.  There is vaginal spotting, bleeding or passing clots. Tell your caregiver of the amount and how many pads are used.  You develop a bad smelling vaginal discharge with a change in the color from clear to white.  You  develop vomiting that lasts more than 24 hours.  You develop chills or fever.  You develop shortness of breath.  You develop burning on urination.  You loose more than 2 pounds of weight or gain more than 2 pounds of weight or as suggested by your caregiver.  You notice sudden swelling of your face, hands, and feet or legs.  You develop belly (abdominal)   pain. Round ligament discomfort is a common non-cancerous (benign) cause of abdominal pain in pregnancy. Your caregiver still must evaluate you.  You develop a severe headache that does not go away.  You develop visual problems, blurred or double vision.  If you have not felt your baby move for more than 1 hour. If you think the baby is not moving as much as usual, eat something with sugar in it and lie down on your left side for an hour. The baby should move at least 4 to 5 times per hour. Call right away if your baby moves less than that.  You fall, are in a car accident, or any kind of trauma.  There is mental or physical violence at home. Document Released: 03/25/2001 Document Revised: 12/24/2011 Document Reviewed: 09/27/2008 [employer] Patient Information 2014 [employer].  Breastfeeding A change in hormones during your pregnancy causes growth of your breast tissue and an increase in number and size of milk ducts. The hormone prolactin allows proteins, sugars, and fats from your blood supply to make breast milk in your milk-producing glands. The hormone progesterone prevents breast milk from being released before the birth of your baby. After the birth of your baby, your progesterone level decreases allowing breast milk to be released. Thoughts of your baby, as well as his or her sucking or crying, can stimulate the release of milk from the milk-producing glands. Deciding to breastfeed (nurse) is one of the best choices you can make for you and your baby. The information that follows gives a brief review of the benefits, as well as  other important skills to know about breastfeeding. BENEFITS OF BREASTFEEDING For your baby  The first milk (colostrum) helps your baby's digestive system function better.   There are antibodies in your milk that help your baby fight off infections.   Your baby has a lower incidence of asthma, allergies, and sudden infant death syndrome (SIDS).   The nutrients in breast milk are better for your baby than infant formulas.  Breast milk improves your baby's brain development.   Your baby will have less gas, colic, and constipation.  Your baby is less likely to develop other conditions, such as childhood obesity, asthma, or diabetes mellitus. For you  Breastfeeding helps develop a very special bond between you and your baby.   Breastfeeding is convenient, always available at the correct temperature, and costs nothing.   Breastfeeding helps to burn calories and helps you lose the weight gained during pregnancy.   Breastfeeding makes your uterus contract back down to normal size faster and slows bleeding following delivery.   Breastfeeding mothers have a lower risk of developing osteoporosis or breast or ovarian cancer later in life.  BREASTFEEDING FREQUENCY  A healthy, full-term baby may breastfeed as often as every hour or space his or her feedings to every 3 hours. Breastfeeding frequency will vary from baby to baby.   Newborns should be fed no less than every 2 3 hours during the day and every 4 5 hours during the night. You should breastfeed a minimum of 8 feedings in a 24 hour period.  Awaken your baby to breastfeed if it has been 3 4 hours since the last feeding.  Breastfeed when you feel the need to reduce the fullness of your breasts or when your newborn shows signs of hunger. Signs that your baby may be hungry include:  Increased alertness or activity.  Stretching.  Movement of the head from side to side.  Movement   of the head and opening of the mouth when  the corner of the mouth or cheek is stroked (rooting).  Increased sucking sounds, smacking lips, cooing, sighing, or squeaking.  Hand-to-mouth movements.  Increased sucking of fingers or hands.  Fussing.  Intermittent crying.  Signs of extreme hunger will require calming and consoling before you try to feed your baby. Signs of extreme hunger may include:  Restlessness.  A loud, strong cry.  Screaming.  Frequent feeding will help you make more milk and will help prevent problems, such as sore nipples and engorgement of the breasts.  BREASTFEEDING   Whether lying down or sitting, be sure that the baby's abdomen is facing your abdomen.   Support your breast with 4 fingers under your breast and your thumb above your nipple. Make sure your fingers are well away from your nipple and your baby's mouth.   Stroke your baby's lips gently with your finger or nipple.   When your baby's mouth is open wide enough, place all of your nipple and as much of the colored area around your nipple (areola) as possible into your baby's mouth.  More areola should be visible above his or her upper lip than below his or her lower lip.  Your baby's tongue should be between his or her lower gum and your breast.  Ensure that your baby's mouth is correctly positioned around the nipple (latched). Your baby's lips should create a seal on your breast.  Signs that your baby has effectively latched onto your nipple include:  Tugging or sucking without pain.  Swallowing heard between sucks.  Absent click or smacking sound.  Muscle movement above and in front of his or her ears with sucking.  Your baby must suck about 2 3 minutes in order to get your milk. Allow your baby to feed on each breast as long as he or she wants. Nurse your baby until he or she unlatches or falls asleep at the first breast, then offer the second breast.  Signs that your baby is full and satisfied include:  A gradual  decrease in the number of sucks or complete cessation of sucking.  Falling asleep.  Extension or relaxation of his or her body.  Retention of a small amount of milk in his or her mouth.  Letting go of your breast by himself or herself.  Signs of effective breastfeeding in you include:  Breasts that have increased firmness, weight, and size prior to feeding.  Breasts that are softer after nursing.  Increased milk volume, as well as a change in milk consistency and color by the 5th day of breastfeeding.  Breast fullness relieved by breastfeeding.  Nipples are not sore, cracked, or bleeding.  If needed, break the suction by putting your finger into the corner of your baby's mouth and sliding your finger between his or her gums. Then, remove your breast from his or her mouth.  It is common for babies to spit up a small amount after a feeding.  Babies often swallow air during feeding. This can make babies fussy. Burping your baby between breasts can help with this.  Vitamin D supplements are recommended for babies who get only breast milk.  Avoid using a pacifier during your baby's first 4 6 weeks.  Avoid supplemental feedings of water, formula, or juice in place of breastfeeding. Breast milk is all the food your baby needs. It is not necessary for your baby to have water or formula. Your breasts will make more   milk if supplemental feedings are avoided during the early weeks. HOW TO TELL WHETHER YOUR BABY IS GETTING ENOUGH BREAST MILK Wondering whether or not your baby is getting enough milk is a common concern among mothers. You can be assured that your baby is getting enough milk if:   Your baby is actively sucking and you hear swallowing.   Your baby seems relaxed and satisfied after a feeding.   Your baby nurses at least 8 12 times in a 24 hour time period.  During the first 3 5 days of age:  Your baby is wetting at least 3 5 diapers in a 24 hour period. The urine should  be clear and pale yellow.  Your baby is having at least 3 4 stools in a 24 hour period. The stool should be soft and yellow.  At 5 7 days of age, your baby is having at least 3 6 stools in a 24 hour period. The stool should be seedy and yellow by 5 days of age.  Your baby has a weight loss less than 7 10% during the first 3 days of age.  Your baby does not lose weight after 3 7 days of age.  Your baby gains 4 7 ounces each week after he or she is 4 days of age.  Your baby gains weight by 5 days of age and is back to birth weight within 2 weeks. ENGORGEMENT In the first week after your baby is born, you may experience extremely full breasts (engorgement). When engorged, your breasts may feel heavy, warm, or tender to the touch. Engorgement peaks within 24 48 hours after delivery of your baby.  Engorgement may be reduced by:  Continuing to breastfeed.  Increasing the frequency of breastfeeding.  Taking warm showers or applying warm, moist heat to your breasts just before each feeding. This increases circulation and helps the milk flow.   Gently massaging your breast before and during the feedings. With your fingertips, massage from your chest wall towards your nipple in a circular motion.   Ensuring that your baby empties at least one breast at every feeding. It also helps to start the next feeding on the opposite breast.   Expressing breast milk by hand or by using a breast pump to empty the breasts if your baby is sleepy, or not nursing well. You may also want to express milk if you are returning to work oryou feel you are getting engorged.  Ensuring your baby is latched on and positioned properly while breastfeeding. If you follow these suggestions, your engorgement should improve in 24 48 hours. If you are still experiencing difficulty, call your lactation consultant or caregiver.  CARING FOR YOURSELF Take care of your breasts.  Bathe or shower daily.   Avoid using soap on  your nipples.   Wear a supportive bra. Avoid wearing underwire style bras.  Air dry your nipples for a 3 4minutes after each feeding.   Use only cotton bra pads to absorb breast milk leakage. Leaking of breast milk between feedings is normal.   Use only pure lanolin on your nipples after nursing. You do not need to wash it off before feeding your baby again. Another option is to express a few drops of breast milk and gently massage that milk into your nipples.  Continue breast self-awareness checks. Take care of yourself.  Eat healthy foods. Alternate 3 meals with 3 snacks.  Avoid foods that you notice affect your baby in a bad way.    Drink milk, fruit juice, and water to satisfy your thirst (about 8 glasses a day).   Rest often, relax, and take your prenatal vitamins to prevent fatigue, stress, and anemia.  Avoid chewing and smoking tobacco.  Avoid alcohol and drug use.  Take over-the-counter and prescribed medicine only as directed by your caregiver or pharmacist. You should always check with your caregiver or pharmacist before taking any new medicine, vitamin, or herbal supplement.  Know that pregnancy is possible while breastfeeding. If desired, talk to your caregiver about family planning and safe birth control methods that may be used while breastfeeding. SEEK MEDICAL CARE IF:   You feel like you want to stop breastfeeding or have become frustrated with breastfeeding.  You have painful breasts or nipples.  Your nipples are cracked or bleeding.  Your breasts are red, tender, or warm.  You have a swollen area on either breast.  You have a fever or chills.  You have nausea or vomiting.  You have drainage from your nipples.  Your breasts do not become full before feedings by the 5th day after delivery.  You feel sad and depressed.  Your baby is too sleepy to eat well.  Your baby is having trouble sleeping.   Your baby is wetting less than 3 diapers in a 24  hour period.  Your baby has less than 3 stools in a 24 hour period.  Your baby's skin or the white part of his or her eyes becomes more yellow.   Your baby is not gaining weight by 5 days of age. MAKE SURE YOU:   Understand these instructions.  Will watch your condition.  Will get help right away if you are not doing well or get worse. Document Released: 03/31/2005 Document Revised: 12/24/2011 Document Reviewed: 11/05/2011 Exit[employer] Patient Information 2014 [employer].  

## 2012-12-18 ENCOUNTER — Encounter (HOSPITAL_COMMUNITY): Payer: Self-pay | Admitting: Family

## 2012-12-18 ENCOUNTER — Inpatient Hospital Stay (HOSPITAL_COMMUNITY)
Admission: AD | Admit: 2012-12-18 | Discharge: 2012-12-22 | DRG: 766 | Disposition: A | Discharge status: Home / Self Care | Payer: Managed Care, Other (non HMO) | Source: Ambulatory Visit | Attending: Family Medicine | Admitting: Family Medicine

## 2012-12-18 DIAGNOSIS — O133 Gestational [pregnancy-induced] hypertension without significant proteinuria, third trimester: Secondary | ICD-10-CM

## 2012-12-18 DIAGNOSIS — R519 Headache, unspecified: Secondary | ICD-10-CM

## 2012-12-18 DIAGNOSIS — O99892 Other specified diseases and conditions complicating childbirth: Secondary | ICD-10-CM | POA: Diagnosis present

## 2012-12-18 DIAGNOSIS — O1414 Severe pre-eclampsia complicating childbirth: Principal | ICD-10-CM | POA: Diagnosis present

## 2012-12-18 DIAGNOSIS — Z22330 Carrier of Group B streptococcus: Secondary | ICD-10-CM

## 2012-12-18 DIAGNOSIS — O09293 Supervision of pregnancy with other poor reproductive or obstetric history, third trimester: Secondary | ICD-10-CM

## 2012-12-18 DIAGNOSIS — B951 Streptococcus, group B, as the cause of diseases classified elsewhere: Secondary | ICD-10-CM

## 2012-12-18 DIAGNOSIS — Z3483 Encounter for supervision of other normal pregnancy, third trimester: Secondary | ICD-10-CM

## 2012-12-18 DIAGNOSIS — Z302 Encounter for sterilization: Secondary | ICD-10-CM

## 2012-12-18 DIAGNOSIS — O26893 Other specified pregnancy related conditions, third trimester: Secondary | ICD-10-CM

## 2012-12-18 DIAGNOSIS — O34219 Maternal care for unspecified type scar from previous cesarean delivery: Secondary | ICD-10-CM | POA: Diagnosis present

## 2012-12-18 DIAGNOSIS — O1413 Severe pre-eclampsia, third trimester: Secondary | ICD-10-CM

## 2012-12-18 LAB — CBC
HCT: 39.3 % (ref 36.0–46.0)
MCH: 28.4 pg (ref 26.0–34.0)
MCV: 85.2 fL (ref 78.0–100.0)
MCV: 83.3 fL (ref 78.0–100.0)
Platelets: 229 10*3/uL (ref 150–400)
RBC: 4.72 MIL/uL (ref 3.87–5.11)
RDW: 14.1 % (ref 11.5–15.5)
RDW: 13.7 % (ref 11.5–15.5)
WBC: 10.3 10*3/uL (ref 4.0–10.5)
WBC: 10.7 10*3/uL — ABNORMAL HIGH (ref 4.0–10.5)

## 2012-12-18 LAB — COMPREHENSIVE METABOLIC PANEL
Albumin: 2.5 g/dL — ABNORMAL LOW (ref 3.5–5.2)
BUN: 5 mg/dL — ABNORMAL LOW (ref 6–23)
CO2: 22 mEq/L (ref 19–32)
Calcium: 9.2 mg/dL (ref 8.4–10.5)
Chloride: 102 mEq/L (ref 96–112)
Creatinine, Ser: 0.5 mg/dL (ref 0.50–1.10)
GFR calc non Af Amer: >90 mL/min (ref >90)
Total Bilirubin: 0.3 mg/dL (ref 0.3–1.2)

## 2012-12-18 LAB — PROTEIN / CREATININE RATIO, URINE
Creatinine, Urine: 133.62 mg/dL
Total Protein, Urine: 13 mg/dL

## 2012-12-18 MED ORDER — MAGNESIUM SULFATE BOLUS VIA INFUSION
4.0000 g | Freq: Once | INTRAVENOUS | Status: AC
  Administered 2012-12-18: 4 g via INTRAVENOUS
  Filled 2012-12-18: qty 500

## 2012-12-18 MED ORDER — LACTATED RINGERS IV SOLN
INTRAVENOUS | Status: DC
  Administered 2012-12-18: 23:00:00 via INTRAVENOUS

## 2012-12-18 MED ORDER — MAGNESIUM SULFATE 40 G IN LACTATED RINGERS - SIMPLE
2.0000 g/h | INTRAVENOUS | Status: DC
  Administered 2012-12-18: 2 g/h via INTRAVENOUS
  Filled 2012-12-18: qty 500

## 2012-12-18 MED ORDER — LABETALOL HCL 5 MG/ML IV SOLN: 20.0000 mg | INTRAVENOUS | Status: DC | PRN

## 2012-12-18 NOTE — MAU Provider Note (Signed)
History     CSN: [id number]  Arrival date and time: 12/18/12 2000   None     Chief Complaint  Patient presents with  . Contractions  . Hypertension   Hypertension Headaches: only 1 since trigger point injection last tues.   Alisha Hughes is a 32 y.o. G67P0101 female @ [redacted]w[redacted]d by LMP which correlates well w/ 13.2wk u/s, who presents w/ report of uc's since being checked yest in clinic (says she was 1cm), has worsened throughout day.  Decreased fm today, 'maybe 6-8 mvmts/2hrs'. Denies vb or lof. Also reports increasing bp's. Is taking q 1hr at home. 140-160s/90s-100s for last 2 days, 170s/105 at home tonight when started feeling bad. Had trigger point injection last tues for recurrent ha's during pregnancy- has only had 1 ha since then 2 nights ago. Seeing spots earlier today, none now. Denies ruq/epigastric pain. Has had reflux during 3rd trimester, states tums really doesn't help. Nausea today, vomited twice 2 nights ago. Normal pre-e labs yest, P:C ratio 0.08. Last solid/liquids at 1900.  Initiated pnc at [facility] @ 6.1wks, genetic screening neg, anatomy u/s normal female, 1hr glucola 109, gbs pos urine.  Has RLTCS and BTL scheduled for 9/15.  Failed IOL for pre-e w/ last pregnancy @ 36wks   OB History   Grav Para Term Preterm Abortions TAB SAB Ect Mult Living   2 1 0 1      1      Past Medical History  Diagnosis Date  . Anxiety   . Pregnancy induced hypertension   . Abnormal Pap smear   . Preterm labor   . Headache(784.0)     migranes.  . Ovarian cyst   . UTI (lower urinary tract infection)     Past Surgical History  Procedure Laterality Date  . Colposcopy vulva w/ biopsy      abnormal pap  . Cesarean section    . Colonoscopy      2009  . Cholecystectomy      2009  . Wisdom tooth extraction      x4  . Wisdom tooth extraction    . Cryotherapy      Family History  Problem Relation Age of Onset  . Hypertension Mother   . Heart disease Father   . Hypertension Father    . Hyperlipidemia Father   . Depression Brother   . Multiple sclerosis Maternal Grandmother   . Heart disease Maternal Grandfather   . Heart disease Paternal Grandmother   . Parkinson's disease Paternal Grandfather     History  Substance Use Topics  . Smoking status: Never Smoker   . Smokeless tobacco: Never Used  . Alcohol Use: Yes     Comment: rarely    Allergies:  Allergies  Allergen Reactions  . Latex Rash    Prescriptions prior to admission  Medication Sig Dispense Refill  . acetaminophen (TYLENOL) 500 MG tablet Take 500 mg by mouth every 6 (six) hours as needed for pain.      . butalbital-acetaminophen-caffeine (FIORICET, ESGIC) 50-325-40 MG per tablet Take 1-2 tablets by mouth every 4 (four) hours as needed.  30 tablet  1  . HYDROcodone-acetaminophen (VICODIN) 5-500 MG per tablet Take 1 tablet by mouth every 6 (six) hours as needed for pain.      . Prenatal Vit-Fe Fumarate-FA (PRENATAL MULTIVITAMIN) TABS tablet Take 1 tablet by mouth at bedtime.      . promethazine (PHENERGAN) 25 MG tablet 25 mg every 6 (six) hours as needed for  nausea.       . SUMAtriptan (IMITREX) 100 MG tablet 100 mg every 2 (two) hours as needed for migraine.         Review of Systems  Constitutional: Negative.   Eyes:       Seeing spots earlier today, none now  Respiratory: Negative.   Cardiovascular: Negative.   Gastrointestinal: Positive for abdominal pain (uc's).  Genitourinary: Negative.   Musculoskeletal: Negative.   Skin: Negative.   Neurological: Negative.  Headaches: only 1 since trigger point injection last tues.  Endo/Heme/Allergies: Negative.   Psychiatric/Behavioral: Negative.    Physical Exam   Blood pressure 139/105, pulse 104, temperature 98.2 F (36.8 C), temperature source Oral, resp. rate 18, height 5\' 3"  (1.6 m), weight 85.73 kg (189 lb), last menstrual period 03/28/2012.  Physical Exam  Constitutional: She is oriented to person, place, and time. She appears  well-developed and well-nourished.  HENT:  Head: Normocephalic.  Neck: Normal range of motion.  Cardiovascular: Normal rate and regular rhythm.   Respiratory: Effort normal and breath sounds normal.  GI: Soft. There is no tenderness (No ruq/epigastric tenderness to palpation).  gravid  Genitourinary:  SVE: outer os 1.5/unable to reach inner os, posterior and high  Musculoskeletal: Normal range of motion. She exhibits edema (1+ BLE edema).  Neurological: She is alert and oriented to person, place, and time. She has normal reflexes. She displays normal reflexes.  DTRs 1+, 2-3 beats clonus bilaterally  Skin: Skin is warm and dry.  Psychiatric: She has a normal mood and affect. Her behavior is normal. Judgment and thought content normal.   FHR: 130, mod variability, ?variable decels (not tracing continously) vs. Maternal movement  UCs: mild, irregular MAU Course  Procedures  NST SVE 2100: Discussed presentation w/ Dr. Shawnie Pons, to repeat labs, keep npo 2245: Feels like uc's are decreasing. Labs essentially unchanged from 9/5, but has several severe range bp's and 2-3 beats clonus bilaterally. Discussed w/ Dr. Shawnie Pons, to admit to ante/L&D, start magnesium, iv labetalol prn, plan for RLTCS and BTL in AM d/t pt having eaten @ 1900.   Results for orders placed during the hospital encounter of 12/18/12 (from the past 24 hour(s))  PROTEIN / CREATININE RATIO, URINE     Status: None   Collection Time    12/18/12  7:45 PM      Result Value Range   Creatinine, Urine 133.62     Total Protein, Urine 13     PROTEIN CREATININE RATIO 0.10  0.00 - 0.15  CBC     Status: Abnormal   Collection Time    12/18/12  9:50 PM      Result Value Range   WBC 10.7 (*) 4.0 - 10.5 K/uL   RBC 4.72  3.87 - 5.11 MIL/uL   Hemoglobin 13.4  12.0 - 15.0 g/dL   HCT 32.4  40.1 - 02.7 %   MCV 83.3  78.0 - 100.0 fL   MCH 28.4  26.0 - 34.0 pg   MCHC 34.1  30.0 - 36.0 g/dL   RDW 25.3  66.4 - 40.3 %   Platelets 225  150 -  400 K/uL  COMPREHENSIVE METABOLIC PANEL     Status: Abnormal   Collection Time    12/18/12  9:50 PM      Result Value Range   Sodium 134 (*) 135 - 145 mEq/L   Potassium 3.8  3.5 - 5.1 mEq/L   Chloride 102  96 - 112 mEq/L   CO2 22  19 - 32 mEq/L   Glucose, Bld 101 (*) 70 - 99 mg/dL   BUN 5 (*) 6 - 23 mg/dL   Creatinine, Ser 8.65  0.50 - 1.10 mg/dL   Calcium 9.2  8.4 - 78.4 mg/dL   Total Protein 6.2  6.0 - 8.3 g/dL   Albumin 2.5 (*) 3.5 - 5.2 g/dL   AST 18  0 - 37 U/L   ALT 13  0 - 35 U/L   Alkaline Phosphatase 203 (*) 39 - 117 U/L   Total Bilirubin 0.3  0.3 - 1.2 mg/dL   GFR calc non Af Amer >90  >90 mL/min   GFR calc Af Amer >90  >90 mL/min     Assessment and Plan  A:   [redacted]w[redacted]d SIUP  G2P0101   Severe preeclampsia based on BPs  Cat I FHR  GBS pos urine  Irregular uc's  Prev c/s, desires repeat w/ BTL  P:  Admit to ante/L&D  Magnesium 4gm load, then 2gm/hr  Labetalol per protocol for severe range bp's  NPO  RLTCS w/ BTL in AM  Marge Duncans 12/18/2012, 8:46 PM

## 2012-12-18 NOTE — MAU Note (Addendum)
Patient presents to MAU with c/o contractions every 8 minutes. Reports BP of 170/110 at home. Reports +FM; denies LOF; reports loss of blood-tinged mucous today.  Patient reports she was seen at [facility] yesterday and labs were done.Marland Kitchen

## 2012-12-18 NOTE — H&P (Signed)
Please refer to MAU Provider note Cheral Marker, CNM, [facility] 12/18/2012 10:54 PM

## 2012-12-19 ENCOUNTER — Encounter (HOSPITAL_COMMUNITY)
Admission: AD | Disposition: A | Discharge status: Home / Self Care | Payer: Self-pay | Source: Ambulatory Visit | Attending: Family Medicine

## 2012-12-19 ENCOUNTER — Encounter (HOSPITAL_COMMUNITY): Payer: Self-pay

## 2012-12-19 ENCOUNTER — Inpatient Hospital Stay (HOSPITAL_COMMUNITY): Payer: Managed Care, Other (non HMO)

## 2012-12-19 DIAGNOSIS — O34219 Maternal care for unspecified type scar from previous cesarean delivery: Secondary | ICD-10-CM

## 2012-12-19 DIAGNOSIS — O9989 Other specified diseases and conditions complicating pregnancy, childbirth and the puerperium: Secondary | ICD-10-CM

## 2012-12-19 DIAGNOSIS — O1414 Severe pre-eclampsia complicating childbirth: Secondary | ICD-10-CM

## 2012-12-19 DIAGNOSIS — Z302 Encounter for sterilization: Secondary | ICD-10-CM

## 2012-12-19 DIAGNOSIS — O99892 Other specified diseases and conditions complicating childbirth: Secondary | ICD-10-CM

## 2012-12-19 LAB — CBC
MCH: 28.2 pg (ref 26.0–34.0)
MCHC: 33.9 g/dL (ref 30.0–36.0)
MCV: 83.3 fL (ref 78.0–100.0)
Platelets: 229 10*3/uL (ref 150–400)
RDW: 13.8 % (ref 11.5–15.5)
WBC: 12.1 10*3/uL — ABNORMAL HIGH (ref 4.0–10.5)

## 2012-12-19 LAB — TYPE AND SCREEN: ABO/RH(D): A POS

## 2012-12-19 SURGERY — Surgical Case
Anesthesia: Spinal | Site: Abdomen | Wound class: Clean Contaminated

## 2012-12-19 MED ORDER — TETANUS-DIPHTH-ACELL PERTUSSIS 5-2.5-18.5 LF-MCG/0.5 IM SUSP
0.5000 mL | Freq: Once | INTRAMUSCULAR | Status: DC
  Filled 2012-12-19: qty 0.5

## 2012-12-19 MED ORDER — LACTATED RINGERS IV SOLN
INTRAVENOUS | Status: DC
  Administered 2012-12-19 – 2012-12-20 (×2): via INTRAVENOUS

## 2012-12-19 MED ORDER — DIPHENHYDRAMINE HCL 50 MG/ML IJ SOLN: 12.5000 mg | INTRAMUSCULAR | Status: DC | PRN

## 2012-12-19 MED ORDER — MEPERIDINE HCL 25 MG/ML IJ SOLN: 6.2500 mg | INTRAMUSCULAR | Status: DC | PRN

## 2012-12-19 MED ORDER — ONDANSETRON HCL 4 MG/2ML IJ SOLN: 4.0000 mg | Freq: Four times a day (QID) | INTRAMUSCULAR | Status: DC | PRN

## 2012-12-19 MED ORDER — BUPIVACAINE HCL (PF) 0.25 % IJ SOLN
INTRAMUSCULAR | Status: AC
  Filled 2012-12-19: qty 30

## 2012-12-19 MED ORDER — ONDANSETRON HCL 4 MG/2ML IJ SOLN: 4.0000 mg | Freq: Three times a day (TID) | INTRAMUSCULAR | Status: DC | PRN

## 2012-12-19 MED ORDER — SIMETHICONE 80 MG PO CHEW
80.0000 mg | CHEWABLE_TABLET | Freq: Three times a day (TID) | ORAL | Status: DC
  Administered 2012-12-19 – 2012-12-22 (×12): 80 mg via ORAL

## 2012-12-19 MED ORDER — CITRIC ACID-SODIUM CITRATE 334-500 MG/5ML PO SOLN
30.0000 mL | ORAL | Status: DC | PRN
  Administered 2012-12-19: 30 mL via ORAL
  Filled 2012-12-19: qty 15

## 2012-12-19 MED ORDER — PRENATAL MULTIVITAMIN CH
1.0000 | ORAL_TABLET | Freq: Every day | ORAL | Status: DC
  Administered 2012-12-20 – 2012-12-22 (×3): 1 via ORAL
  Filled 2012-12-19 (×3): qty 1

## 2012-12-19 MED ORDER — SCOPOLAMINE 1 MG/3DAYS TD PT72
MEDICATED_PATCH | TRANSDERMAL | Status: AC
  Filled 2012-12-19: qty 1

## 2012-12-19 MED ORDER — NALOXONE HCL 1 MG/ML IJ SOLN
1.0000 ug/kg/h | INTRAVENOUS | Status: DC | PRN
  Filled 2012-12-19: qty 2

## 2012-12-19 MED ORDER — CEFAZOLIN SODIUM-DEXTROSE 2-3 GM-% IV SOLR
2.0000 g | INTRAVENOUS | Status: AC
  Administered 2012-12-19: 2 g via INTRAVENOUS
  Filled 2012-12-19: qty 50

## 2012-12-19 MED ORDER — DIPHENHYDRAMINE HCL 50 MG/ML IJ SOLN: 25.0000 mg | INTRAMUSCULAR | Status: DC | PRN

## 2012-12-19 MED ORDER — PHENYLEPHRINE 40 MCG/ML (10ML) SYRINGE FOR IV PUSH (FOR BLOOD PRESSURE SUPPORT)
PREFILLED_SYRINGE | INTRAVENOUS | Status: AC
  Filled 2012-12-19: qty 5

## 2012-12-19 MED ORDER — FENTANYL CITRATE 0.05 MG/ML IJ SOLN: 25.0000 ug | INTRAMUSCULAR | Status: DC | PRN

## 2012-12-19 MED ORDER — KETOROLAC TROMETHAMINE 30 MG/ML IJ SOLN: 30.0000 mg | Freq: Four times a day (QID) | INTRAMUSCULAR | Status: AC | PRN

## 2012-12-19 MED ORDER — NALBUPHINE HCL 10 MG/ML IJ SOLN
5.0000 mg | INTRAMUSCULAR | Status: DC | PRN
  Filled 2012-12-19: qty 1

## 2012-12-19 MED ORDER — ZOLPIDEM TARTRATE 5 MG PO TABS: 5.0000 mg | ORAL_TABLET | Freq: Every evening | ORAL | Status: DC | PRN

## 2012-12-19 MED ORDER — ONDANSETRON HCL 4 MG PO TABS: 4.0000 mg | ORAL_TABLET | ORAL | Status: DC | PRN

## 2012-12-19 MED ORDER — DIBUCAINE 1 % RE OINT: 1.0000 "application " | TOPICAL_OINTMENT | RECTAL | Status: DC | PRN

## 2012-12-19 MED ORDER — OXYCODONE-ACETAMINOPHEN 5-325 MG PO TABS: 1.0000 | ORAL_TABLET | ORAL | Status: DC | PRN

## 2012-12-19 MED ORDER — ONDANSETRON HCL 4 MG/2ML IJ SOLN: 4.0000 mg | INTRAMUSCULAR | Status: DC | PRN

## 2012-12-19 MED ORDER — OXYTOCIN 40 UNITS IN LACTATED RINGERS INFUSION - SIMPLE MED: 62.5000 mL/h | INTRAVENOUS | Status: AC

## 2012-12-19 MED ORDER — PROMETHAZINE HCL 25 MG/ML IJ SOLN: 6.2500 mg | INTRAMUSCULAR | Status: DC | PRN

## 2012-12-19 MED ORDER — EPHEDRINE 5 MG/ML INJ
INTRAVENOUS | Status: AC
  Filled 2012-12-19: qty 10

## 2012-12-19 MED ORDER — LANOLIN HYDROUS EX OINT: 1.0000 "application " | TOPICAL_OINTMENT | CUTANEOUS | Status: DC | PRN

## 2012-12-19 MED ORDER — SODIUM CHLORIDE 0.9 % IJ SOLN: 3.0000 mL | INTRAMUSCULAR | Status: DC | PRN

## 2012-12-19 MED ORDER — LIDOCAINE HCL (PF) 1 % IJ SOLN: 30.0000 mL | INTRAMUSCULAR | Status: DC | PRN

## 2012-12-19 MED ORDER — FENTANYL CITRATE 0.05 MG/ML IJ SOLN
INTRAMUSCULAR | Status: AC
  Filled 2012-12-19: qty 2

## 2012-12-19 MED ORDER — KETOROLAC TROMETHAMINE 30 MG/ML IJ SOLN: 15.0000 mg | Freq: Once | INTRAMUSCULAR | Status: DC | PRN

## 2012-12-19 MED ORDER — LACTATED RINGERS IV SOLN
INTRAVENOUS | Status: DC | PRN
  Administered 2012-12-19 (×2): via INTRAVENOUS

## 2012-12-19 MED ORDER — OXYTOCIN 10 UNIT/ML IJ SOLN
40.0000 [IU] | INTRAVENOUS | Status: DC | PRN
  Administered 2012-12-19: 40 [IU] via INTRAVENOUS

## 2012-12-19 MED ORDER — BUPIVACAINE HCL (PF) 0.25 % IJ SOLN
INTRAMUSCULAR | Status: DC | PRN
  Administered 2012-12-19: 30 mL

## 2012-12-19 MED ORDER — PHENYLEPHRINE HCL 10 MG/ML IJ SOLN
INTRAMUSCULAR | Status: DC | PRN
  Administered 2012-12-19: 80 ug via INTRAVENOUS
  Administered 2012-12-19: 40 ug via INTRAVENOUS

## 2012-12-19 MED ORDER — ATROPINE SULFATE 0.1 MG/ML IJ SOLN
INTRAMUSCULAR | Status: AC
  Filled 2012-12-19: qty 10

## 2012-12-19 MED ORDER — MORPHINE SULFATE (PF) 0.5 MG/ML IJ SOLN
INTRAMUSCULAR | Status: DC | PRN
  Administered 2012-12-19: .15 mg via INTRATHECAL

## 2012-12-19 MED ORDER — KETOROLAC TROMETHAMINE 30 MG/ML IJ SOLN
INTRAMUSCULAR | Status: AC
  Filled 2012-12-19: qty 1

## 2012-12-19 MED ORDER — LACTATED RINGERS IV SOLN: INTRAVENOUS | Status: DC

## 2012-12-19 MED ORDER — SCOPOLAMINE 1 MG/3DAYS TD PT72
1.0000 | MEDICATED_PATCH | Freq: Once | TRANSDERMAL | Status: AC
  Administered 2012-12-19: 1.5 mg via TRANSDERMAL

## 2012-12-19 MED ORDER — MORPHINE SULFATE 0.5 MG/ML IJ SOLN
INTRAMUSCULAR | Status: AC
  Filled 2012-12-19: qty 10

## 2012-12-19 MED ORDER — WITCH HAZEL-GLYCERIN EX PADS: 1.0000 "application " | MEDICATED_PAD | CUTANEOUS | Status: DC | PRN

## 2012-12-19 MED ORDER — IBUPROFEN 600 MG PO TABS
600.0000 mg | ORAL_TABLET | Freq: Four times a day (QID) | ORAL | Status: DC
  Administered 2012-12-19 – 2012-12-22 (×11): 600 mg via ORAL
  Filled 2012-12-19 (×11): qty 1

## 2012-12-19 MED ORDER — 0.9 % SODIUM CHLORIDE (POUR BTL) OPTIME
TOPICAL | Status: DC | PRN
  Administered 2012-12-19: 1000 mL

## 2012-12-19 MED ORDER — ONDANSETRON HCL 4 MG/2ML IJ SOLN
INTRAMUSCULAR | Status: AC
  Filled 2012-12-19: qty 2

## 2012-12-19 MED ORDER — SIMETHICONE 80 MG PO CHEW: 80.0000 mg | CHEWABLE_TABLET | ORAL | Status: DC | PRN

## 2012-12-19 MED ORDER — LACTATED RINGERS IV SOLN: 500.0000 mL | INTRAVENOUS | Status: DC | PRN

## 2012-12-19 MED ORDER — DIPHENHYDRAMINE HCL 25 MG PO CAPS: 25.0000 mg | ORAL_CAPSULE | Freq: Four times a day (QID) | ORAL | Status: DC | PRN

## 2012-12-19 MED ORDER — CEFAZOLIN (ANCEF) 1 G IV SOLR
2.0000 g | INTRAVENOUS | Status: DC
  Filled 2012-12-19: qty 2

## 2012-12-19 MED ORDER — LACTATED RINGERS IV SOLN
INTRAVENOUS | Status: DC | PRN
  Administered 2012-12-19: 08:00:00 via INTRAVENOUS

## 2012-12-19 MED ORDER — SODIUM CHLORIDE 0.9 % IJ SOLN
INTRAMUSCULAR | Status: AC
  Filled 2012-12-19: qty 3

## 2012-12-19 MED ORDER — FENTANYL CITRATE 0.05 MG/ML IJ SOLN
INTRAMUSCULAR | Status: DC | PRN
  Administered 2012-12-19: 25 ug via INTRATHECAL

## 2012-12-19 MED ORDER — FLEET ENEMA 7-19 GM/118ML RE ENEM: 1.0000 | ENEMA | Freq: Every day | RECTAL | Status: DC | PRN

## 2012-12-19 MED ORDER — MENTHOL 3 MG MT LOZG: 1.0000 | LOZENGE | OROMUCOSAL | Status: DC | PRN

## 2012-12-19 MED ORDER — SENNOSIDES-DOCUSATE SODIUM 8.6-50 MG PO TABS
2.0000 | ORAL_TABLET | Freq: Every day | ORAL | Status: DC
  Administered 2012-12-19 – 2012-12-21 (×3): 2 via ORAL

## 2012-12-19 MED ORDER — OXYCODONE-ACETAMINOPHEN 5-325 MG PO TABS
1.0000 | ORAL_TABLET | ORAL | Status: DC | PRN
  Administered 2012-12-19: 1 via ORAL
  Administered 2012-12-20 (×2): 2 via ORAL
  Administered 2012-12-20 (×2): 1 via ORAL
  Administered 2012-12-21: 2 via ORAL
  Administered 2012-12-21 (×2): 1 via ORAL
  Administered 2012-12-22: 2 via ORAL
  Filled 2012-12-19: qty 1
  Filled 2012-12-19 (×2): qty 2
  Filled 2012-12-19 (×2): qty 1
  Filled 2012-12-19 (×2): qty 2
  Filled 2012-12-19 (×2): qty 1

## 2012-12-19 MED ORDER — EPHEDRINE SULFATE 50 MG/ML IJ SOLN
INTRAMUSCULAR | Status: DC | PRN
  Administered 2012-12-19: 10 mg via INTRAVENOUS
  Administered 2012-12-19: 5 mg via INTRAVENOUS
  Administered 2012-12-19: 10 mg via INTRAVENOUS
  Administered 2012-12-19 (×2): 20 mg via INTRAVENOUS

## 2012-12-19 MED ORDER — OXYTOCIN 10 UNIT/ML IJ SOLN
INTRAMUSCULAR | Status: AC
  Filled 2012-12-19: qty 4

## 2012-12-19 MED ORDER — BISACODYL 10 MG RE SUPP: 10.0000 mg | Freq: Every day | RECTAL | Status: DC | PRN

## 2012-12-19 MED ORDER — ATROPINE SULFATE 0.4 MG/ML IJ SOLN
INTRAMUSCULAR | Status: DC | PRN
  Administered 2012-12-19: 0.2 mg via INTRAVENOUS

## 2012-12-19 MED ORDER — MAGNESIUM SULFATE 40 G IN LACTATED RINGERS - SIMPLE
2.0000 g/h | INTRAVENOUS | Status: AC
  Administered 2012-12-19: 2 g/h via INTRAVENOUS
  Filled 2012-12-19 (×2): qty 500

## 2012-12-19 MED ORDER — MIDAZOLAM HCL 2 MG/2ML IJ SOLN: 0.5000 mg | Freq: Once | INTRAMUSCULAR | Status: DC | PRN

## 2012-12-19 MED ORDER — NALOXONE HCL 0.4 MG/ML IJ SOLN: 0.4000 mg | INTRAMUSCULAR | Status: DC | PRN

## 2012-12-19 MED ORDER — DIPHENHYDRAMINE HCL 25 MG PO CAPS
25.0000 mg | ORAL_CAPSULE | ORAL | Status: DC | PRN
  Administered 2012-12-20: 25 mg via ORAL
  Filled 2012-12-19: qty 1

## 2012-12-19 MED ORDER — KETOROLAC TROMETHAMINE 30 MG/ML IJ SOLN
30.0000 mg | Freq: Four times a day (QID) | INTRAMUSCULAR | Status: AC | PRN
  Administered 2012-12-19: 30 mg via INTRAVENOUS

## 2012-12-19 MED ORDER — BUPIVACAINE IN DEXTROSE 0.75-8.25 % IT SOLN
INTRATHECAL | Status: DC | PRN
  Administered 2012-12-19: 1.5 mL via INTRATHECAL

## 2012-12-19 MED ORDER — FLEET ENEMA 7-19 GM/118ML RE ENEM: 1.0000 | ENEMA | RECTAL | Status: DC | PRN

## 2012-12-19 MED ORDER — OXYTOCIN 40 UNITS IN LACTATED RINGERS INFUSION - SIMPLE MED: 62.5000 mL/h | INTRAVENOUS | Status: DC

## 2012-12-19 MED ORDER — IBUPROFEN 600 MG PO TABS: 600.0000 mg | ORAL_TABLET | Freq: Four times a day (QID) | ORAL | Status: DC | PRN

## 2012-12-19 MED ORDER — OXYTOCIN BOLUS FROM INFUSION: 500.0000 mL | INTRAVENOUS | Status: DC

## 2012-12-19 MED ORDER — ONDANSETRON HCL 4 MG/2ML IJ SOLN
INTRAMUSCULAR | Status: DC | PRN
  Administered 2012-12-19: 4 mg via INTRAVENOUS

## 2012-12-19 MED ORDER — BUTORPHANOL TARTRATE 1 MG/ML IJ SOLN
1.0000 mg | INTRAMUSCULAR | Status: DC | PRN
  Administered 2012-12-19: 1 mg via INTRAVENOUS
  Filled 2012-12-19: qty 1

## 2012-12-19 MED ORDER — ACETAMINOPHEN 325 MG PO TABS: 650.0000 mg | ORAL_TABLET | ORAL | Status: DC | PRN

## 2012-12-19 SURGICAL SUPPLY — 39 items
BENZOIN TINCTURE PRP APPL 2/3 (GAUZE/BANDAGES/DRESSINGS) ×2 IMPLANT
CLAMP CORD UMBIL (MISCELLANEOUS) IMPLANT
CLIP FILSHIE TUBAL LIGA STRL (Clip) ×4 IMPLANT
CLOTH BEACON ORANGE TIMEOUT ST (SAFETY) ×2 IMPLANT
DRAPE LG THREE QUARTER DISP (DRAPES) ×2 IMPLANT
DRSG OPSITE POSTOP 4X10 (GAUZE/BANDAGES/DRESSINGS) ×2 IMPLANT
DURAPREP 26ML APPLICATOR (WOUND CARE) ×2 IMPLANT
ELECT REM PT RETURN 9FT ADLT (ELECTROSURGICAL) ×2
ELECTRODE REM PT RTRN 9FT ADLT (ELECTROSURGICAL) ×1 IMPLANT
EXTRACTOR VACUUM M CUP 4 TUBE (SUCTIONS) IMPLANT
GLOVE BIOGEL PI IND STRL 6.5 (GLOVE) ×1 IMPLANT
GLOVE BIOGEL PI IND STRL 7.0 (GLOVE) ×3 IMPLANT
GLOVE BIOGEL PI INDICATOR 6.5 (GLOVE) ×1
GLOVE BIOGEL PI INDICATOR 7.0 (GLOVE) ×3
GLOVE ECLIPSE 7.0 STRL STRAW (GLOVE) IMPLANT
GLOVE SKINSENSE NS SZ6.5 (GLOVE) ×2
GLOVE SKINSENSE NS SZ7.0 (GLOVE) ×1
GLOVE SKINSENSE STRL SZ6.5 (GLOVE) ×2 IMPLANT
GLOVE SKINSENSE STRL SZ7.0 (GLOVE) ×1 IMPLANT
GOWN PREVENTION PLUS XLARGE (GOWN DISPOSABLE) ×2 IMPLANT
GOWN STRL REIN XL XLG (GOWN DISPOSABLE) ×2 IMPLANT
KIT ABG SYR 3ML LUER SLIP (SYRINGE) IMPLANT
NEEDLE HYPO 22GX1.5 SAFETY (NEEDLE) ×2 IMPLANT
NEEDLE HYPO 25X5/8 SAFETYGLIDE (NEEDLE) IMPLANT
NS IRRIG 1000ML POUR BTL (IV SOLUTION) ×2 IMPLANT
PACK C SECTION WH (CUSTOM PROCEDURE TRAY) ×2 IMPLANT
PAD ABD 7.5X8 STRL (GAUZE/BANDAGES/DRESSINGS) ×2 IMPLANT
PAD OB MATERNITY 4.3X12.25 (PERSONAL CARE ITEMS) ×2 IMPLANT
RTRCTR C-SECT PINK 25CM LRG (MISCELLANEOUS) ×2 IMPLANT
STAPLER VISISTAT 35W (STAPLE) IMPLANT
STRIP CLOSURE SKIN 1/2X4 (GAUZE/BANDAGES/DRESSINGS) ×2 IMPLANT
SUT VIC AB 0 CTX 36 (SUTURE) ×3
SUT VIC AB 0 CTX36XBRD ANBCTRL (SUTURE) ×3 IMPLANT
SUT VIC AB 4-0 KS 27 (SUTURE) IMPLANT
SYR 30ML LL (SYRINGE) ×2 IMPLANT
TAPE CLOTH SURG 4X10 WHT LF (GAUZE/BANDAGES/DRESSINGS) ×2 IMPLANT
TOWEL OR 17X24 6PK STRL BLUE (TOWEL DISPOSABLE) ×2 IMPLANT
TRAY FOLEY CATH 14FR (SET/KITS/TRAYS/PACK) ×2 IMPLANT
WATER STERILE IRR 1000ML POUR (IV SOLUTION) ×2 IMPLANT

## 2012-12-19 NOTE — Anesthesia Postprocedure Evaluation (Signed)
Anesthesia Post Note  Patient: Alisha Hughes  Procedure(s) Performed: Procedure(s) (LRB): Repeat cesarean section with delivery of baby girl at 70.    Bilateral tubal ligation with filshie clips. (N/A)  Anesthesia type: Spinal  Patient location: PACU  Post pain: Pain level controlled  Post assessment: Post-op Vital signs reviewed  Last Vitals:  Filed Vitals:   12/19/12 1000  BP: 135/87  Pulse: 100  Temp:   Resp: 33    Post vital signs: Reviewed  Level of consciousness: awake  Complications: No apparent anesthesia complications

## 2012-12-19 NOTE — Transfer of Care (Signed)
Immediate Anesthesia Transfer of Care Note  Patient: Alisha Hughes  Procedure(s) Performed: Procedure(s): Repeat cesarean section with delivery of baby girl at 85.    Bilateral tubal ligation with filshie clips. (N/A)  Patient Location: PACU  Anesthesia Type:Spinal  Level of Consciousness: awake, alert  and oriented  Airway & Oxygen Therapy: Patient Spontanous Breathing  Post-op Assessment: Report given to PACU RN and Post -op Vital signs reviewed and stable  Post vital signs: Reviewed and stable  Complications: No apparent anesthesia complications

## 2012-12-19 NOTE — H&P (Signed)
Chart reviewed and agree with management and plan.  

## 2012-12-19 NOTE — OR Nursing (Addendum)
Uterus massaged by S. Markese Bloxham Charity fundraiser. 0cc of blood evacuated from uterus during uterine massage. Two tubes of  Cord blood sent to lab.

## 2012-12-19 NOTE — Anesthesia Preprocedure Evaluation (Signed)
Anesthesia Evaluation  Patient identified by MRN, date of birth, ID band Patient awake    Reviewed: Allergy & Precautions, H&P , NPO status , Patient's Chart, lab work & pertinent test results  Airway Mallampati: II      Dental no notable dental hx.    Pulmonary neg pulmonary ROS,  breath sounds clear to auscultation  Pulmonary exam normal       Cardiovascular Exercise Tolerance: Good hypertension, negative cardio ROS  Rhythm:regular Rate:Normal     Neuro/Psych  Headaches, PSYCHIATRIC DISORDERS Anxiety negative neurological ROS  negative psych ROS   GI/Hepatic negative GI ROS, Neg liver ROS,   Endo/Other  negative endocrine ROS  Renal/GU negative Renal ROS  negative genitourinary   Musculoskeletal   Abdominal Normal abdominal exam  (+)   Peds  Hematology negative hematology ROS (+)   Anesthesia Other Findings   Reproductive/Obstetrics (+) Pregnancy                           Anesthesia Physical Anesthesia Plan  ASA: III  Anesthesia Plan: Spinal   Post-op Pain Management:    Induction:   Airway Management Planned:   Additional Equipment:   Intra-op Plan:   Post-operative Plan:   Informed Consent: I have reviewed the patients History and Physical, chart, labs and discussed the procedure including the risks, benefits and alternatives for the proposed anesthesia with the patient or authorized representative who has indicated his/her understanding and acceptance.     Plan Discussed with: Anesthesiologist, CRNA and Surgeon  Anesthesia Plan Comments:         Anesthesia Quick Evaluation

## 2012-12-19 NOTE — OR Nursing (Signed)
Filshie clips applied to right and left fallopian tubes by Dr. Gildardo Griffes on 12/19/2012. Lot number [id number]. Expiration date-03/2015.  [employer].

## 2012-12-19 NOTE — MAU Provider Note (Signed)
Chart reviewed and agree with management and plan.  

## 2012-12-19 NOTE — Progress Notes (Signed)
Patient ID: Alisha Hughes, female   DOB: Nov 22, 1980, 32 y.o.   MRN: [id number] BP is improved after Magnesium was started.  Now NPO.  Will move toward delivery. Risks include but are not limited to bleeding, infection, injury to surrounding structures, including bowel, bladder and ureters, blood clots, and death.  Likelihood of success is high.  Also desires permanent sterilization. Patient counseled, r.e. Risks benefits of BTL, including permanency of procedure, risk of failure(1:100), increased risk of ectopic.  Patient verbalized understanding and desires to proceed

## 2012-12-19 NOTE — Op Note (Signed)
Alisha Hughes PROCEDURE DATE: 12/18/2012 - 12/19/2012  PREOPERATIVE DIAGNOSES: Intrauterine pregnancy at  [redacted]w[redacted]d weeks gestation; previous uterine incision LTCS and severe preeclampsia; undesired fertility  POSTOPERATIVE DIAGNOSES: The same  PROCEDURE: Repeat Low Transverse Cesarean Section, Bilateral Tubal Sterilization using Filshie clips  SURGEON:  Dr. Shawnie Pons and Dr. Ike Bene ASSISTANT:  Ike Bene  INDICATIONS: Alisha Hughes is a 32 y.o. G2P1101 at [redacted]w[redacted]d here for cesarean section and bilateral tubal sterilization secondary to the indications listed under preoperative diagnoses; please see preoperative note for further details.  The risks of surgery were discussed with the patient including but were not limited to: bleeding which may require transfusion or reoperation; infection which may require antibiotics; injury to bowel, bladder, ureters or other surrounding organs; injury to the fetus; need for additional procedures including hysterectomy in the event of a life-threatening hemorrhage; placental abnormalities wth subsequent pregnancies, incisional problems, thromboembolic phenomenon and other postoperative/anesthesia complications.  Patient also desires permanent sterilization.  Other reversible forms of contraception were discussed with patient; she declines all other modalities. Risks of procedure discussed with patient including but not limited to: risk of regret, permanence of method, bleeding, infection, injury to surrounding organs and need for additional procedures.  Failure risk of 0.5-1% with increased risk of ectopic gestation if pregnancy occurs was also discussed with patient.  The patient concurred with the proposed plan, giving informed written consent for the procedures.    FINDINGS:  Viable female infant in cephalic presentation.  Apgars Pending.  Clear amniotic fluid.  Intact placenta, three vessel cord.  Normal uterus, fallopian tubes and ovaries bilaterally.  ANESTHESIA: Spinal INTRAVENOUS  FLUIDS: 1125 ml ESTIMATED BLOOD LOSS: 1000 ml URINE OUTPUT:  200 ml SPECIMENS: Placenta sent to pathology COMPLICATIONS: None immediate  PROCEDURE IN DETAIL:  The patient preoperatively received intravenous antibiotics and had sequential compression devices applied to her lower extremities.   She was then taken to the operating room where spinal anesthesia was administered and was found to be adequate. She was then placed in a dorsal supine position with a leftward tilt, and prepped and draped in a sterile manner.  A foley catheter was placed into her bladder and attached to constant gravity.  After an adequate timeout was performed, a Pfannenstiel skin incision was made with scalpel over prior scar and carried through to the underlying layer of fascia. The fascia was incised in the midline, and this incision was extended bilaterally using the Mayo scissors.  Kocher clamps were applied to the superior aspect of the fascial incision and the underlying rectus muscles were dissected off bluntly which led to the peritoneum. The rectus muscles were separated in the midline bluntly and the peritoneum was entered bluntly. The Lanier Prude was placed for visualization.  Attention was turned to the lower uterine segment, where the bladder was appreciated over the desired level of hysterotomy. A bladder flap was created with metzibaums and a bladder blade was placed for visualization. Next a low transverse hysterotomy was made with a scalpel and extended bilaterally bluntly.  The infant was successfully delivered, the cord was clamped and cut and the infant was handed over to awaiting neonatology team. Cord blood collected. Uterine massage was then administered, and the placenta delivered intact with a three-vessel cord. The uterus was then cleared of clot and debris.  The hysterotomy was closed with 0 Vicryl in a running locked fashion. 1 figure of 8 was required for bleeding along the incision. Attention was then turned  to the fallopian tubes.  A Filshie  clip was placed on both tubes, about 3 cm from the cornua, with care given to incorporate the underlying mesosalpinx on both sides, allowing for bilateral tubal sterilization. <1cc of marcaine was injected on bilateral aspects of the fallopian tube. The pelvis was cleared of all clot and debris. Hemostasis was confirmed on all surfaces.  The peritoneum and the muscles were reapproximated using 0 Vicryl purse string. The fascia was then closed using 0 Vicryl in a running fashion.  The subcutaneous layer was irrigated, and 30 ml of 0.5% Marcaine was injected subcutaneously around the incision.  The skin was closed with a 4-0 Vicryl subcuticular stitch. The patient tolerated the procedure well. Sponge, lap, instrument and needle counts were correct x 2.  She was taken to the recovery room in stable condition.   Alisha Scale, MD OB Fellow

## 2012-12-19 NOTE — Anesthesia Procedure Notes (Signed)
Spinal  Patient location during procedure: OR Start time: 12/19/2012 8:34 AM Staffing Anesthesiologist: Angus Seller., Harrell Gave. Performed by: anesthesiologist  Preanesthetic Checklist Completed: patient identified, site marked, surgical consent, pre-op evaluation, timeout performed, IV checked, risks and benefits discussed and monitors and equipment checked Spinal Block Patient position: sitting Prep: DuraPrep Patient monitoring: heart rate, cardiac monitor, continuous pulse ox and blood pressure Approach: midline Location: L3-4 Injection technique: single-shot Needle Needle type: Sprotte  Needle gauge: 24 G Needle length: 9 cm Assessment Sensory level: T4 Additional Notes Patient identified.  Risk benefits discussed including failed block, incomplete pain control, headache, nerve damage, paralysis, blood pressure changes, nausea, vomiting, reactions to medication both toxic or allergic, and postpartum back pain.  Patient expressed understanding and wished to proceed.  All questions were answered.  Sterile technique used throughout procedure.  CSF was clear.  No parasthesia or other complications.  Please see nursing notes for vital signs.

## 2012-12-20 ENCOUNTER — Encounter (HOSPITAL_COMMUNITY): Payer: Self-pay | Admitting: Anesthesiology

## 2012-12-20 ENCOUNTER — Telehealth: Payer: Self-pay | Admitting: *Deleted

## 2012-12-20 ENCOUNTER — Encounter (HOSPITAL_COMMUNITY): Payer: Self-pay | Admitting: Family Medicine

## 2012-12-20 ENCOUNTER — Encounter: Payer: Managed Care, Other (non HMO) | Admitting: Family Medicine

## 2012-12-20 LAB — CBC
HCT: 29.9 % — ABNORMAL LOW (ref 36.0–46.0)
Hemoglobin: 10 g/dL — ABNORMAL LOW (ref 12.0–15.0)
MCV: 84.7 fL (ref 78.0–100.0)
WBC: 12.5 10*3/uL — ABNORMAL HIGH (ref 4.0–10.5)

## 2012-12-20 NOTE — Progress Notes (Signed)
Post Partum Day 1 Subjective: no complaints and tolerating PO. Pt denies HA, no visual changes.  No SOB.    Objective: Blood pressure 121/73, pulse 63, temperature 98 F (36.7 C), temperature source Oral, resp. rate 16, height 5\' 3"  (1.6 m), weight 185 lb (83.915 kg), last menstrual period 03/28/2012, SpO2 96.00%, unknown if currently breastfeeding. Urine output: diuresing well Physical Exam:  General: alert and mild distress Lochia: appropriate Uterine Fundus: firm Incision: dressing clean and dry  DVT Evaluation: No evidence of DVT seen on physical exam. SCD"s in place; mild pedal edema-symmetric   Recent Labs  12/19/12 0752 12/20/12 0505  HGB 13.0 10.0*  HCT 38.4 29.9*    Assessment/Plan: Contraception s/p BTL  On Magnesium sulfate to stop at ~0900 Transfer to [facility] after 4hour observation   LOS: 2 days   HARRAWAY-SMITH, Blakelynn Scheeler 12/20/2012, 7:22 AM

## 2012-12-20 NOTE — Telephone Encounter (Signed)
Pt aware.

## 2012-12-20 NOTE — Progress Notes (Signed)
UR chart review completed.  

## 2012-12-20 NOTE — Anesthesia Postprocedure Evaluation (Signed)
  Anesthesia Post-op Note  Patient: Alisha Hughes  Procedure(s) Performed: Procedure(s): Repeat cesarean section with delivery of baby girl at 26.    Bilateral tubal ligation with filshie clips. (N/A)  Patient Location: PACU and A-ICU  Anesthesia Type:Spinal  Level of Consciousness: awake, alert , oriented and patient cooperative  Airway and Oxygen Therapy: Patient Spontanous Breathing  Post-op Pain: mild  Post-op Assessment: Patient's Cardiovascular Status Stable, Respiratory Function Stable, Patent Airway, No signs of Nausea or vomiting, Adequate PO intake and Pain level controlled  Post-op Vital Signs: Reviewed and stable  Complications: No apparent anesthesia complications

## 2012-12-20 NOTE — Telephone Encounter (Signed)
Message copied by Grayland Ormond on Mon Dec 20, 2012  9:05 AM ------      Message from: Reva Bores      Created: Sat Dec 18, 2012  8:41 AM       Labs are completely WNL ------

## 2012-12-21 NOTE — Lactation Note (Signed)
This note was copied from the chart of Alisha Hughes. Lactation Consultation Note   Follow up consult with this mom and baby. I fitted mom for a 24 nipples shield, and instructed her in it's use. Mom demonstrated back to me how to apply, and di well, with little assistance. i also gave mom comfort gels and instructed her in their use. i will see this family prior to their discharge to home tomorrow.  Patient Name: Alisha Hughes [id number] Date: 12/21/2012 Reason for consult: Follow-up assessment;NICU baby   Maternal Data    Feeding Feeding Type: Breast Milk Nipple Type: Regular Length of feed: 30 min  LATCH Score/Interventions                      Lactation Tools Discussed/Used Tools: Nipple Shields Nipple shield size: 24   Consult Status Consult Status: Follow-up Date: 12/22/12 Follow-up type: In-patient    [name] 12/21/2012, 4:39 PM

## 2012-12-21 NOTE — Lactation Note (Signed)
This note was copied from the chart of Alisha Ellyanna Holton. Lactation Consultation Note  Follow up consult with this NICU term baby and mom. I observed mom latching her baby, The baby latched  well, lips flanged and strong suckles. I noted mom has raw nipples, and she reports biting pain and discomfort with lath. I noted that the baby has what may be  a short  frenulum, and when she tries to elevate her tongue, it only reaches half way up to the roof of her mouth, and forms a bowl shape. I called NNP Jenn            , and made her aware.  The baby and mom may be discharged to home tomorrow. I told mom to be sure to let the pediatrician know that breast feeding is painful, and to have the MD assess baby's tongue and frenulum.  I will see mom later today, and make sure she has comfort gels, and a nipple shield, which should help both her pain, and the baby's latch/milk transfer.  I will teach mom how to apply shield.  Patient Name: Alisha Hughes [id number] Date: 12/21/2012 Reason for consult: Follow-up assessment;NICU baby   Maternal Data    Feeding Feeding Type: Breast Milk Nipple Type: Slow - flow Length of feed: 15 min  LATCH Score/Interventions Latch: Grasps breast easily, tongue down, lips flanged, rhythmical sucking. Intervention(s): Assist with latch  Audible Swallowing: Spontaneous and intermittent  Type of Nipple: Everted at rest and after stimulation  Comfort (Breast/Nipple): Engorged, cracked, bleeding, large blisters, severe discomfort Problem noted: Cracked, bleeding, blisters, bruises (baby has bowl shaped tongue with elevation) Intervention(s): Expressed breast milk to nipple;Other (comment)     Hold (Positioning): Assistance needed to correctly position infant at breast and maintain latch. Intervention(s): Breastfeeding basics reviewed;Support Pillows;Position options;Skin to skin  LATCH Score: 7  Lactation Tools Discussed/Used Tools: Comfort gels   Consult  Status Consult Status: Follow-up Date: 12/22/12 Follow-up type: In-patient    [name] 12/21/2012, 2:13 PM

## 2012-12-21 NOTE — Progress Notes (Signed)
Clinical Social Work Department PSYCHOSOCIAL ASSESSMENT - MATERNAL/CHILD 12/21/2012  Patient:  Schwartz,Levaeh  Account Number:  [id number]  Admit Date:  12/18/2012  Childs Name:   Zoey Gabrielsen    Clinical Social Worker:  Rahshawn Remo, LCSW   Date/Time:  12/21/2012 12:08 PM  Date Referred:     Referral source  NICU     Referred reason  NICU  Behavioral Health Issues   Other referral source:    I:  FAMILY / HOME ENVIRONMENT Child's legal guardian:  PARENT  Guardian - Name Guardian - Age Guardian - Address  Nickayla Mearns [address], [place], [place] [address]  Andrew Calandra  same   Other household support members/support persons Name Relationship DOB  Laila Marinaccio DAUGHTER 3   Other support:   Great support system of family and friends in the area.    II  PSYCHOSOCIAL DATA Information Source:  Family Interview  Financial and Community Resources Employment:   Did not discuss   Financial resources:  Private Insurance If Medicaid - County:    School / Grade:   Maternity Care Coordinator / Child Services Coordination / Early Interventions:   CC4C  Cultural issues impacting care:   None stated    III  STRENGTHS Strengths  Adequate Resources  Compliance with medical plan  Home prepared for Child (including basic supplies)  Other - See comment  Supportive family/friends  Understanding of illness   Strength comment:  Pediatric follow up will be at [facility]   IV  RISK FACTORS AND CURRENT PROBLEMS Current Problem:  YES   Risk Factor & Current Problem Patient Issue Family Issue Risk Factor / Current Problem Comment  Mental Illness Y N Anxiety   N N     V  SOCIAL WORK ASSESSMENT  CSW met with parents in MOB's third floor room to introduce myself and complete assessment due to NICU admission and maternal hx of Anxiety.  Parents were pleasant and welcomed CSW into the room.  MOB reports that everyone is doing well and that they are hopeful that baby  will get to discharge home with them tomorrow.  She states this is a potential per NICU staff.  They report having a great support system and no questions or needs at this time.  CSW inquired about MOB's hx of Anxiety and she states she feels anxious at night, describing the feeling that someone is sitting on her chest.  She states it happened numerous times per week during her third trimester, but only happens on occasion when she is not pregnant.  She does not appear to be concerned about her anxiety and states she has an rx for Xanax PRN.  CSW asked about signs and symptoms of PPD after her first daughter and she states no issues.  CSW reviewed what to watch for and MOB states she feels comfortable calling her doctor if concerns arise.  CSW gave contact information and encouraged parents to call if they have any questions or needs while baby is in the NICU.  CSW has no social concerns at this time.   VI SOCIAL WORK PLAN Social Work Plan  Psychosocial Support/Ongoing Assessment of Needs   Type of pt/family education:   PPD signs and symptoms   If child protective services report - county:   If child protective services report - date:   Information/referral to community resources comment:   CC4C   Other social work plan:    

## 2012-12-21 NOTE — Progress Notes (Signed)
Subjective: Postpartum Day 2: Cesarean Delivery Patient reports incisional pain, tolerating PO, + flatus, + BM and no problems voiding.  Blurry vision since yesterday  Objective: Vital signs in last 24 hours: Temp:  [97.5 F (36.4 C)-98.3 F (36.8 C)] 98 F (36.7 C) (09/09 0531) Pulse Rate:  [62-81] 62 (09/09 0531) Resp:  [16-18] 18 (09/09 0531) BP: (117-143)/(75-95) 128/90 mmHg (09/09 0531) SpO2:  [97 %-100 %] 98 % (09/09 0531) Weight:  [83.15 kg (183 lb 5 oz)] 83.15 kg (183 lb 5 oz) (09/09 0531)  Filed Vitals:   12/20/12 2015 12/20/12 2129 12/21/12 0123 12/21/12 0531  BP: 119/78 130/83 141/95 128/90  Pulse:  70 80 62  Temp:  97.5 F (36.4 C) 98.2 F (36.8 C) 98 F (36.7 C)  TempSrc:  Oral Oral Oral  Resp:  18 18 18   Height:      Weight:    83.15 kg (183 lb 5 oz)  SpO2:  99% 99% 98%     Physical Exam:  General: alert, cooperative, appears stated age and no distress Lochia: appropriate Uterine Fundus: firm @U , appropriately tender to palpation Incision: healing well, no dehiscence, no significant erythema, took down dressing, minor sanguinous discharge now dry. DVT Evaluation: No evidence of DVT seen on physical exam. Negative Homan's sign. No cords or calf tenderness. Calf/Ankle edema is present.   Recent Labs  12/19/12 0752 12/20/12 0505  HGB 13.0 10.0*  HCT 38.4 29.9*    Assessment/Plan: -Status post Cesarean section. Doing well postoperatively.  -Continue current care. -BP mostly at goal. Highest at 143/95, will not start medication at this time, will have nurse f/u at home 1 week for BP Check -diureses at goal.  -Blurry vision likely related to fluid status, continue to monitor for improvement  Antania Hoefling RYAN 12/21/2012, 7:32 AM

## 2012-12-22 ENCOUNTER — Ambulatory Visit: Payer: Self-pay

## 2012-12-22 MED ORDER — IBUPROFEN 600 MG PO TABS: 600.0000 mg | ORAL_TABLET | Freq: Four times a day (QID) | ORAL | Status: DC

## 2012-12-22 MED ORDER — HYDROCHLOROTHIAZIDE 25 MG PO TABS: 25.0000 mg | ORAL_TABLET | Freq: Every day | ORAL | Status: DC

## 2012-12-22 MED ORDER — HYDROCHLOROTHIAZIDE 25 MG PO TABS
25.0000 mg | ORAL_TABLET | Freq: Once | ORAL | Status: AC
  Administered 2012-12-22: 25 mg via ORAL
  Filled 2012-12-22: qty 1

## 2012-12-22 MED ORDER — INFLUENZA VAC SPLIT QUAD 0.5 ML IM SUSP: 0.5000 mL | INTRAMUSCULAR | Status: DC

## 2012-12-22 MED ORDER — OXYCODONE-ACETAMINOPHEN 5-325 MG PO TABS: 1.0000 | ORAL_TABLET | ORAL | Status: DC | PRN

## 2012-12-22 MED ORDER — INFLUENZA VAC SPLIT QUAD 0.5 ML IM SUSP
0.5000 mL | Freq: Once | INTRAMUSCULAR | Status: AC
  Administered 2012-12-22: 0.5 mL via INTRAMUSCULAR

## 2012-12-22 NOTE — Progress Notes (Signed)
Ur chart review completed per request.  

## 2012-12-22 NOTE — Discharge Summary (Signed)
Obstetric Discharge Summary Reason for Admission: Pre-eclampsia based on severe-range BPs. Prenatal Procedures: NST, Preeclampsia and ultrasound Intrapartum Procedures: cesarean: low cervical, transverse and tubal ligation Postpartum Procedures: none Complications-Operative and Postpartum: none  Hospital course: Admitted 12/18/2012 for pre-eclampsia. Magnesiam Sulfate started.  RLTCS and BTL 12/20/2012 due to severe-range BP's. Magnesium Sulfate D/Ced on PPD #1. BPs improved, but still 140-150's/80-90's. HCTZ started.  Hemoglobin  Date Value Range Status  12/20/2012 10.0* 12.0 - 15.0 g/dL Final     DELTA CHECK NOTED     REPEATED TO VERIFY     HCT  Date Value Range Status  12/20/2012 29.9* 36.0 - 46.0 % Final    Physical Exam:  General: alert, cooperative, appears stated age and no distress Lochia: appropriate Uterine Fundus: firm Incision: healing well, old blood on dressing. Well-approximated.  DVT Evaluation: Negative Homan's sign. 2+ pedal edema. Diuresing adequately.   Discharge Diagnoses: Term Pregnancy-delivered and Preelampsia  Discharge Information: Date: 12/22/2012 Activity: pelvic rest and no lifting greater >10 pounds. Diet: routine Medications: PNV, Ibuprofen, Percocet and HCTZ Condition: stable Instructions: refer to practice specific booklet and Pre-eclampsia precautions. Discharge to: home Follow-up Information   Follow up with [facility] In 1 week.   Specialty:  Obstetrics and Gynecology   Contact information:   [address] [place] [address] [phone]      Follow up with [facility]. (As needed in emergencies)    Contact information:   [address] [id number] [place] [address] [phone]      Newborn Data: Live born female  Birth Weight: 7 lb 3.3 oz (3270 g) APGAR: 7, 8  Home with mother. NICU discharge expected today.   Alisha Hughes 12/22/2012, 7:48 AM

## 2012-12-22 NOTE — Lactation Note (Signed)
This note was copied from the chart of Alisha Shelbey Spindler. Lactation Consultation Note    Follow up consult with this mom and baby, a NICU patient now 32 days old. Mom and baby are both being discharged to home today. On exam, mom's breasts are full with easily expressed transitional milk, already pumping 20-20 mls every 3 hours. Mom plans to just breast feed at home, with cues. (no pumping for now) Her nipples are healing, and mom reports feeling better.  I gave mom a hand pump to have, in case she wants to supplement or feels over full. Mom knows to call lactation for questions/concerns/out patient consult as needed.  Patient Name: Alisha Hughes NFAOZ'H Date: 12/22/2012     Maternal Data    Feeding    LATCH Score/Interventions                      Lactation Tools Discussed/Used     Consult Status      [name] 12/22/2012, 6:10 PM

## 2012-12-22 NOTE — Progress Notes (Signed)
Pt ambulated out  Teaching complete 

## 2012-12-23 ENCOUNTER — Encounter: Payer: Managed Care, Other (non HMO) | Admitting: Obstetrics and Gynecology

## 2012-12-24 ENCOUNTER — Other Ambulatory Visit (HOSPITAL_COMMUNITY): Payer: Managed Care, Other (non HMO)

## 2012-12-27 ENCOUNTER — Inpatient Hospital Stay (HOSPITAL_COMMUNITY)
Admission: RE | Admit: 2012-12-27 | Payer: Managed Care, Other (non HMO) | Source: Ambulatory Visit | Admitting: Obstetrics and Gynecology

## 2012-12-27 ENCOUNTER — Encounter (HOSPITAL_COMMUNITY): Admission: RE | Payer: Self-pay | Source: Ambulatory Visit

## 2012-12-27 SURGERY — Surgical Case
Anesthesia: Regional | Site: Abdomen | Laterality: Bilateral

## 2012-12-30 ENCOUNTER — Ambulatory Visit (INDEPENDENT_AMBULATORY_CARE_PROVIDER_SITE_OTHER): Payer: Managed Care, Other (non HMO) | Admitting: Obstetrics and Gynecology

## 2012-12-30 ENCOUNTER — Encounter: Payer: Self-pay | Admitting: Obstetrics and Gynecology

## 2012-12-30 VITALS — BP 132/92 | HR 82 | Ht 63.0 in | Wt 171.0 lb

## 2012-12-30 DIAGNOSIS — O1415 Severe pre-eclampsia, complicating the puerperium: Secondary | ICD-10-CM

## 2012-12-30 DIAGNOSIS — Z0130 Encounter for examination of blood pressure without abnormal findings: Secondary | ICD-10-CM

## 2012-12-30 NOTE — Progress Notes (Signed)
Patient ID: Gwyneth Fernandez, female   DOB: 1981/04/08, 32 y.o.   MRN: [id number] 32 yo G2P1102 s/p delivery on 9/7 secondary to severe preeclampsia. Patient was started on HCTZ on day of discharge and is here today for BP check. Patient still continues to take HCTZ. Patient also complaining of pain on left aspect of incision.  GENERAL: Well-developed, well-nourished female in no acute distress.  ABDOMEN: Soft, nontender, nondistended.  Incision: no eryhtma, induration or drainage. Incision healing well. Contact dermatitis noted from honeycomb dressing EXTREMITIES: No cyanosis, clubbing, or edema, 2+ distal pulses.  A/P 32 yo s/p c-section with BTL on 9/7 here for BP check - Continue HCTZ - Advised to use benadryl cream on contact dermatitis - Reassurance provided regarding incision pain on left side which is most likely related to excess suture material from knot - RTC in 4 weeks for postpartum visit

## 2013-01-04 NOTE — Progress Notes (Signed)
This encounter was created in error - please disregard.

## 2013-01-07 ENCOUNTER — Ambulatory Visit (INDEPENDENT_AMBULATORY_CARE_PROVIDER_SITE_OTHER): Payer: Managed Care, Other (non HMO) | Admitting: Obstetrics and Gynecology

## 2013-01-07 ENCOUNTER — Encounter: Payer: Self-pay | Admitting: Obstetrics and Gynecology

## 2013-01-07 VITALS — BP 128/98 | HR 71 | Ht 63.0 in | Wt 170.0 lb

## 2013-01-07 DIAGNOSIS — N926 Irregular menstruation, unspecified: Secondary | ICD-10-CM

## 2013-01-07 DIAGNOSIS — N939 Abnormal uterine and vaginal bleeding, unspecified: Secondary | ICD-10-CM

## 2013-01-07 DIAGNOSIS — O9122 Nonpurulent mastitis associated with the puerperium: Secondary | ICD-10-CM

## 2013-01-07 MED ORDER — MICONAZOLE NITRATE 2 % EX CREA: TOPICAL_CREAM | CUTANEOUS | Status: DC

## 2013-01-07 NOTE — Progress Notes (Signed)
Patient ID: Alisha Hughes, female   DOB: [birth year], 32 y.o.   MRN: [id number] 32 yo G2P1102 s/p repeat c-section on 9/7 presenting today for evaluation of vaginal bleeding and breast rash. Patient reports bleeding for 1 day s/p c-section. Bleeding returned 3 days ago and was heavy in flow with passage of clots. Patient reports onset of a breast rash approximately 1 week ago, very pruritic, with associated burning and stabbing sensation. Patient has been using breast pump for the past few days and has noted her milk supply decrease.  GENERAL: Well-developed, well-nourished female in no acute distress.  BREASTS: Symmetric in size. No palpable masses or lymphadenopathy, skin changes, or nipple drainage. Area of erythema visible on nipple area bilaterally. No palpable enlarged milk ducts or tenderness. ABDOMEN: Soft, nontender, nondistended. No organomegaly. PELVIC: Normal external female genitalia. Vagina is pink and rugated.  Normal discharge. Small blood clot visualized at os, no active bleeding. Normal appearing cervix. Uterus is 12-week in size. No adnexal mass or tenderness. EXTREMITIES: No cyanosis, clubbing, or edema, 2+ distal pulses.  A/P 32 yo s/p c-section with vaginal bleeding and yeast infection of breast - Rx miconazole cream provided to apply after each feeding. Patient also advised to bring infant to pediatrician for possible treatment of oral thrush - Reassurance provided regarding vaginal bleeding which is expected to continue for up to 6 weeks during postpartum period - Will obtain pelvic ultrasound to rule out retained placenta but less likely - RTC for postpartum visit

## 2013-01-11 ENCOUNTER — Telehealth: Payer: Self-pay | Admitting: *Deleted

## 2013-01-11 NOTE — Telephone Encounter (Signed)
Insurance is denying medication because it is over the counter.  Patient notified of this.

## 2013-01-18 ENCOUNTER — Ambulatory Visit (HOSPITAL_COMMUNITY): Payer: Managed Care, Other (non HMO)

## 2013-01-21 ENCOUNTER — Telehealth: Payer: Self-pay | Admitting: *Deleted

## 2013-01-21 DIAGNOSIS — K649 Unspecified hemorrhoids: Secondary | ICD-10-CM

## 2013-01-21 MED ORDER — HYDROCORTISONE ACETATE 25 MG RE SUPP: 25.0000 mg | Freq: Two times a day (BID) | RECTAL | Status: DC

## 2013-01-21 NOTE — Telephone Encounter (Signed)
Patients hemorrhoids are inflamed and she would like something more than otc meds to help.  Anusol called to patients pharmacy.

## 2013-02-08 ENCOUNTER — Ambulatory Visit (INDEPENDENT_AMBULATORY_CARE_PROVIDER_SITE_OTHER): Payer: Managed Care, Other (non HMO) | Admitting: Family Medicine

## 2013-02-08 ENCOUNTER — Encounter: Payer: Self-pay | Admitting: Family Medicine

## 2013-02-08 VITALS — BP 123/97 | HR 81 | Ht 63.0 in | Wt 176.0 lb

## 2013-02-08 DIAGNOSIS — IMO0001 Reserved for inherently not codable concepts without codable children: Secondary | ICD-10-CM

## 2013-02-08 DIAGNOSIS — R030 Elevated blood-pressure reading, without diagnosis of hypertension: Secondary | ICD-10-CM

## 2013-02-08 DIAGNOSIS — I10 Essential (primary) hypertension: Secondary | ICD-10-CM | POA: Insufficient documentation

## 2013-02-08 DIAGNOSIS — Z124 Encounter for screening for malignant neoplasm of cervix: Secondary | ICD-10-CM

## 2013-02-08 DIAGNOSIS — Z1151 Encounter for screening for human papillomavirus (HPV): Secondary | ICD-10-CM

## 2013-02-08 MED ORDER — HYDROCHLOROTHIAZIDE 25 MG PO TABS: 25.0000 mg | ORAL_TABLET | Freq: Every day | ORAL | Status: DC

## 2013-02-08 NOTE — Addendum Note (Signed)
Addended by: Barbara Cower on: 02/08/2013 11:29 AM   Modules accepted: Orders

## 2013-02-08 NOTE — Progress Notes (Signed)
  Subjective:     Alisha Hughes is a 32 y.o. female who presents for a postpartum visit. She is 6 weeks postpartum following a low cervical transverse Cesarean section. I have fully reviewed the prenatal and intrapartum course. The delivery was at 38 gestational weeks. Outcome: repeat cesarean section, low transverse incision. Anesthesia: spinal. Postpartum course has been uneventful. Baby's course has been unremarkable. Baby is feeding by bottle - low gas producing. Bleeding staining only. Bowel function is normal. Bladder function is normal. Patient is not sexually active. Contraception method is tubal ligation. Postpartum depression screening: negative.  The following portions of the patient's history were reviewed and updated as appropriate: allergies, current medications, past family history, past medical history, past social history, past surgical history and problem list.  Review of Systems Pertinent items are noted in HPI.   Objective:    BP 123/97  Pulse 81  Ht 5\' 3"  (1.6 m)  Wt 176 lb (79.833 kg)  BMI 31.18 kg/m2  Breastfeeding? No  General:  alert, cooperative and appears stated age  Abdomen: soft, non-tender; bowel sounds normal; no masses,  no organomegaly   Vulva:  normal  Vagina: normal vagina  Cervix:  no cervical motion tenderness  Corpus: normal size, contour, position, consistency, mobility, non-tender  Adnexa:  normal adnexa        Assessment:     Normal postpartum exam. Pap smear done at today's visit.  BP continues to be mildly elevated Plan:    1. Contraception: tubal ligation 2.  Let us know if having trouble with mood. 3.  Continue HCTZ given BP elevation today-f/u with PCP. 4.  Follow up in: 1 year or as needed.

## 2013-04-01 ENCOUNTER — Telehealth: Payer: Self-pay | Admitting: *Deleted

## 2013-04-01 DIAGNOSIS — B373 Candidiasis of vulva and vagina: Secondary | ICD-10-CM

## 2013-04-01 DIAGNOSIS — B3731 Acute candidiasis of vulva and vagina: Secondary | ICD-10-CM

## 2013-04-01 MED ORDER — FLUCONAZOLE 150 MG PO TABS: 150.0000 mg | ORAL_TABLET | Freq: Once | ORAL | Status: DC

## 2013-04-01 NOTE — Telephone Encounter (Signed)
Patient called complaining of clumpy white discharge and itching and irritation.  She would like something called in for a yeast infection.

## 2014-02-13 ENCOUNTER — Encounter: Payer: Self-pay | Admitting: Family Medicine

## 2014-06-13 ENCOUNTER — Ambulatory Visit (INDEPENDENT_AMBULATORY_CARE_PROVIDER_SITE_OTHER): Payer: 59 | Admitting: Obstetrics & Gynecology

## 2014-06-13 ENCOUNTER — Encounter: Payer: Self-pay | Admitting: Obstetrics & Gynecology

## 2014-06-13 VITALS — BP 139/99 | HR 80 | Ht 63.0 in | Wt 152.2 lb

## 2014-06-13 DIAGNOSIS — Z01419 Encounter for gynecological examination (general) (routine) without abnormal findings: Secondary | ICD-10-CM

## 2014-06-13 DIAGNOSIS — Z124 Encounter for screening for malignant neoplasm of cervix: Secondary | ICD-10-CM

## 2014-06-13 DIAGNOSIS — Z0000 Encounter for general adult medical examination without abnormal findings: Secondary | ICD-10-CM

## 2014-06-13 DIAGNOSIS — Z3041 Encounter for surveillance of contraceptive pills: Secondary | ICD-10-CM

## 2014-06-13 DIAGNOSIS — Z1151 Encounter for screening for human papillomavirus (HPV): Secondary | ICD-10-CM

## 2014-06-13 DIAGNOSIS — R6882 Decreased libido: Secondary | ICD-10-CM

## 2014-06-13 MED ORDER — LEVONORGEST-ETH ESTRAD 91-DAY 0.15-0.03 &0.01 MG PO TABS: 1.0000 | ORAL_TABLET | Freq: Every day | ORAL | Status: DC

## 2014-06-13 NOTE — Progress Notes (Signed)
Patient is having increased cysts on her ovaries, had this issue in the past and used ocp to help.  Once she got her tubes tied she stopped ocp and now the cysts are returning.  She has had 3 in 6 months so far.  She would also like to discuss decreased libido and her periods have been irregular as well, but she feels like this has to do with the cysts throwing things off.  Primary care checked TSH and it was normal.

## 2014-06-13 NOTE — Progress Notes (Signed)
Subjective:    Alisha Hughes is a 34 y.o. MW P2 ( 34 yo and 17 month old)  female who presents for an annual exam. She has a h/o recurrent ovarian cysts and feels that she has one on the left currently. She wants to restart OCPs to prevent cysts. The patient is sexually active. GYN screening history: last pap: was normal. The patient wears seatbelts: yes. The patient participates in regular exercise: yes. Has the patient ever been transfused or tattooed?: yes. The patient reports that there is not domestic violence in her life.   Menstrual History: OB History    Gravida Para Term Preterm AB TAB SAB Ectopic Multiple Living   2 2 1 1      1       Menarche age: 22 Patient's last menstrual period was 05/23/2014 (exact date).    The following portions of the patient's history were reviewed and updated as appropriate: allergies, current medications, past family history, past medical history, past social history, past surgical history and problem list.  Review of Systems A comprehensive review of systems was negative. Married for 5 years. Decreased libido for 4 years.   Objective:    BP 139/99 mmHg  Pulse 80  Ht 5\' 3"  (1.6 m)  Wt 152 lb 3.2 oz (69.037 kg)  BMI 26.97 kg/m2  LMP 05/23/2014 (Exact Date)  Breastfeeding? No  General Appearance:    Alert, cooperative, no distress, appears stated age  Head:    Normocephalic, without obvious abnormality, atraumatic  Eyes:    PERRL, conjunctiva/corneas clear, EOM's intact, fundi    benign, both eyes  Ears:    Normal TM's and external ear canals, both ears  Nose:   Nares normal, septum midline, mucosa normal, no drainage    or sinus tenderness  Throat:   Lips, mucosa, and tongue normal; teeth and gums normal  Neck:   Supple, symmetrical, trachea midline, no adenopathy;    thyroid:  no enlargement/tenderness/nodules; no carotid   bruit or JVD  Back:     Symmetric, no curvature, ROM normal, no CVA tenderness  Lungs:     Clear to auscultation  bilaterally, respirations unlabored  Chest Wall:    No tenderness or deformity   Heart:    Regular rate and rhythm, S1 and S2 normal, no murmur, rub   or gallop  Breast Exam:    No tenderness, masses, or nipple abnormality  Abdomen:     Soft, non-tender, bowel sounds active all four quadrants,    no masses, no organomegaly  Genitalia:    Normal female without lesion, discharge or tenderness, NSSR, mobile, slightly tender, normal adnexal exam     Extremities:   Extremities normal, atraumatic, no cyanosis or edema  Pulses:   2+ and symmetric all extremities  Skin:   Skin color, texture, turgor normal, no rashes or lesions  Lymph nodes:   Cervical, supraclavicular, and axillary nodes normal  Neurologic:   CNII-XII intact, normal strength, sensation and reflexes    throughout  .    Assessment:    Healthy female exam.   Ovarian cyst pain- reassurance given Libido- her FP prescribed Addyi but the pharmacy wouldn't give it to her because her fp was not an approved prescriber.   Plan:     Breast self exam technique reviewed and patient encouraged to perform self-exam monthly. Thin prep Pap smear. with cotesting Prescribe Addyi- discussed ETOH risks RTC 2 months

## 2014-06-15 LAB — CYTOLOGY - PAP

## 2014-08-08 ENCOUNTER — Ambulatory Visit (INDEPENDENT_AMBULATORY_CARE_PROVIDER_SITE_OTHER): Payer: 59 | Admitting: Obstetrics & Gynecology

## 2014-08-08 VITALS — BP 122/85 | HR 81 | Ht 63.0 in | Wt 154.0 lb

## 2014-08-08 DIAGNOSIS — R6882 Decreased libido: Secondary | ICD-10-CM

## 2014-08-08 LAB — CBC
HCT: 41 % (ref 36.0–46.0)
Hemoglobin: 13.1 g/dL (ref 12.0–15.0)
MCH: 26.5 pg (ref 26.0–34.0)
MCHC: 32 g/dL (ref 30.0–36.0)
MCV: 83 fL (ref 78.0–100.0)
MPV: 10.1 fL (ref 8.6–12.4)
Platelets: 270 10*3/uL (ref 150–400)
RBC: 4.94 MIL/uL (ref 3.87–5.11)
RDW: 14 % (ref 11.5–15.5)
WBC: 4.6 10*3/uL (ref 4.0–10.5)

## 2014-08-08 NOTE — Progress Notes (Signed)
Here today for follow since starting Addy.  She has not seen any improvement since starting.  She did not start the [facility] that was given to her for ovarian cysts.  Her periods are coming every two weeks.

## 2014-08-08 NOTE — Progress Notes (Signed)
   Subjective:    Patient ID: Alisha Hughes, female    DOB: 01/09/1981, 34 y.o.   MRN: [id number]  HPI  34 yo MW P2 here for follow up and also with complaint of DUB. She tried the Addyi for a month but didn't see any improvement in her libido.  She also tells me that for the last 4-5 months she has had 2 "full blown" periods per month. Feeling weak. She tells me that her TSH was normal recently, checked by her primary care MD in [place].  She did not start the OCPs as she felt that her ovarian cyst resolved.  Review of Systems She uses BTL for contraception.    Objective:   Physical Exam  WNWHWFNAD Breathing and ambulating normally Abd- benign      Assessment & Plan:  Decreased libido-check test. And prescribe test. If levels are low.  DUB- check CBC and gyn u/s  RTC prn

## 2014-08-09 LAB — TESTOSTERONE, % FREE: TESTOSTERONE-% FREE: 1.1 % (ref 0.4–2.4)

## 2014-08-09 LAB — TESTOSTERONE, FREE: Testosterone, Free: 4.9 pg/mL (ref 0.6–6.8)

## 2014-08-09 LAB — TESTOSTERONE: Testosterone: 45 ng/dL (ref 10–70)

## 2014-08-09 LAB — SEX HORMONE BINDING GLOBULIN: SEX HORMONE BINDING: 69 nmol/L (ref 17–124)

## 2014-08-18 ENCOUNTER — Ambulatory Visit (HOSPITAL_COMMUNITY): Payer: 59

## 2014-10-20 ENCOUNTER — Telehealth: Payer: Self-pay | Admitting: Primary Care

## 2014-10-20 NOTE — Telephone Encounter (Signed)
Opened in error

## 2014-10-20 NOTE — Telephone Encounter (Signed)
Pt wants to est w/you as her PCP and I have her scheduled 11/02/2014 which was your first available, however, she is having a lot of trouble w/dizziness she thinks is associated w/anxiety issues and needs to be seen sooner than 11/02/2014.  Is there anyway you could accommodate a new patient apptmt (maybe 45 mins) prior to 11/02/2014?  Please advise. Thank you.

## 2014-11-02 ENCOUNTER — Ambulatory Visit: Payer: 59 | Admitting: Primary Care

## 2015-02-08 IMAGING — US US OB DETAIL+14 WK
2 series · 12 of 28 positions shown · non-contrast
Comparison: none

[Series 1: us ob detail +14 wk · 80 acquisitions, 11 frames shown (1 of 2)]
[im 4/80]
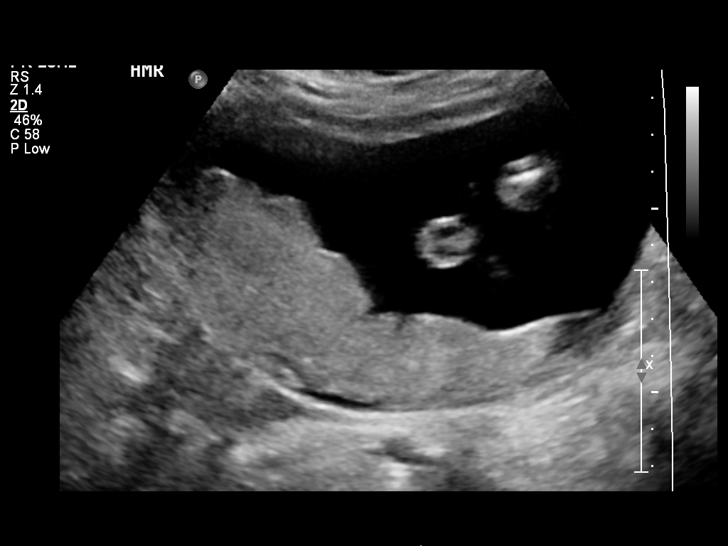
[im 10/80]
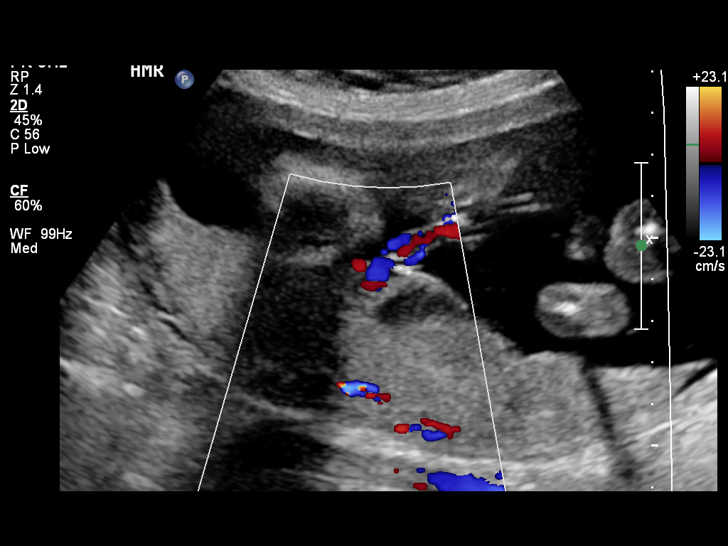
[im 17/80]
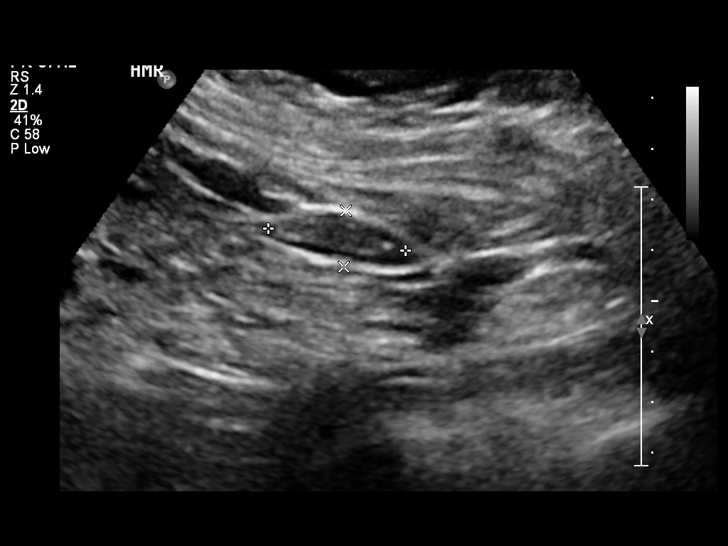
[im 27/80]
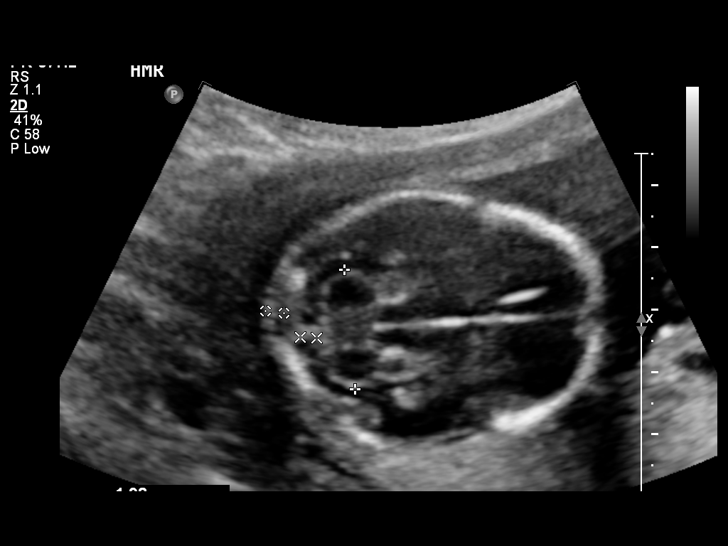
[im 33/80]
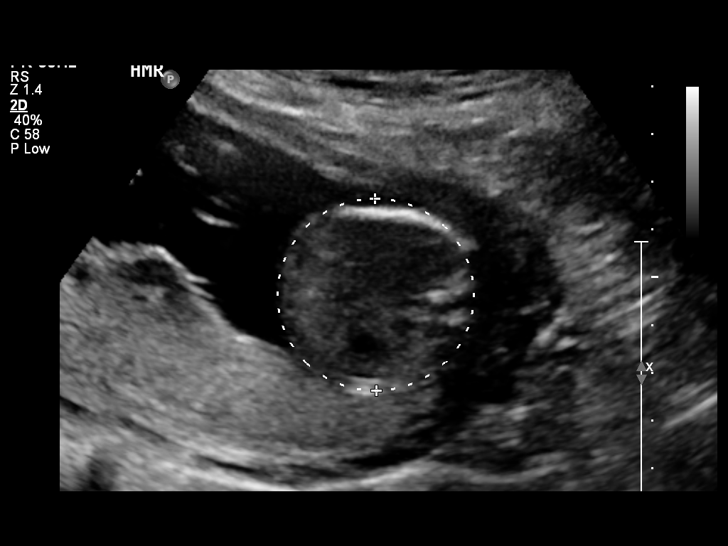
[im 40/80]
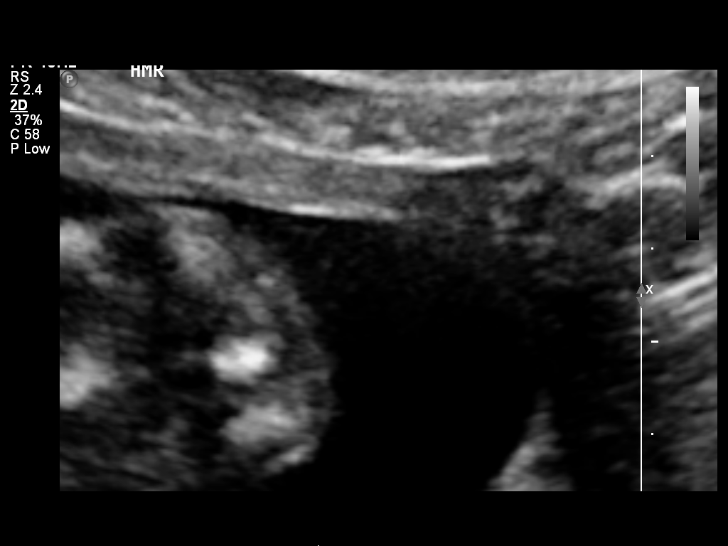
[im 50/80]
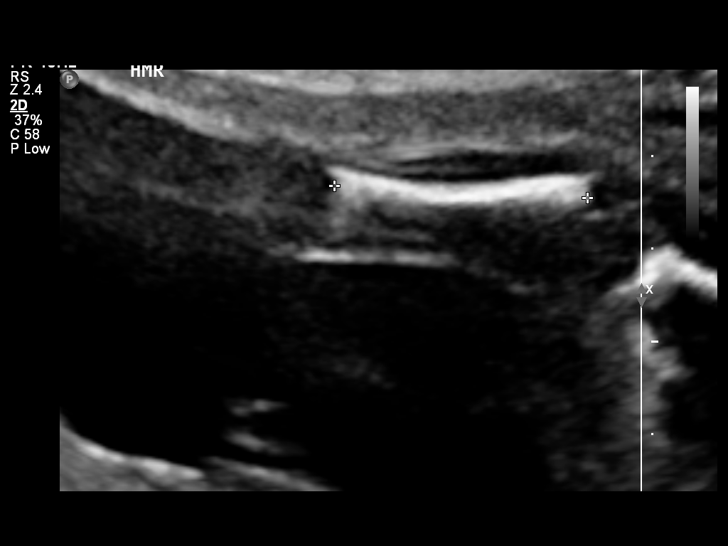
[im 56/80]
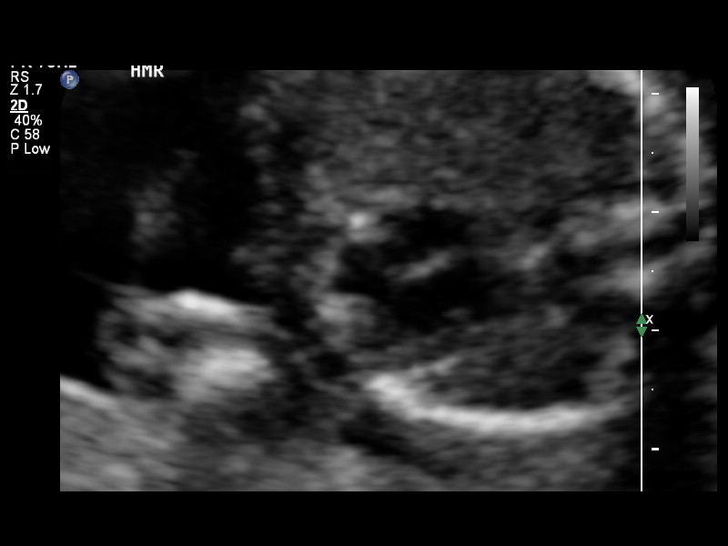
[im 63/80]
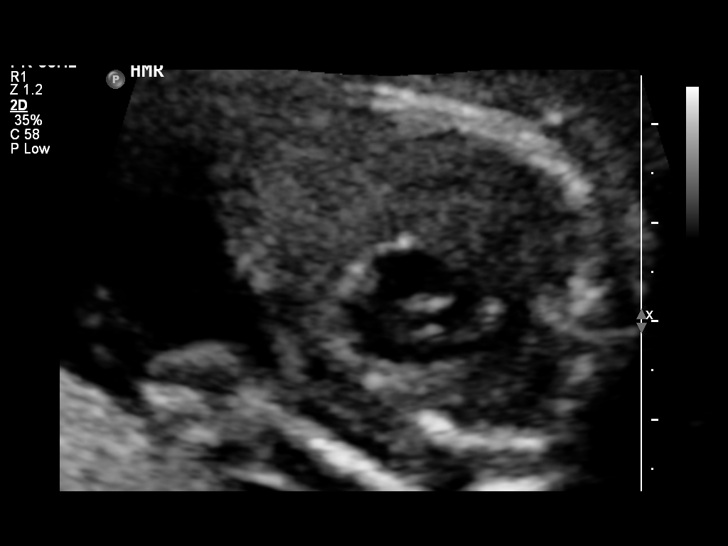
[im 73/80]
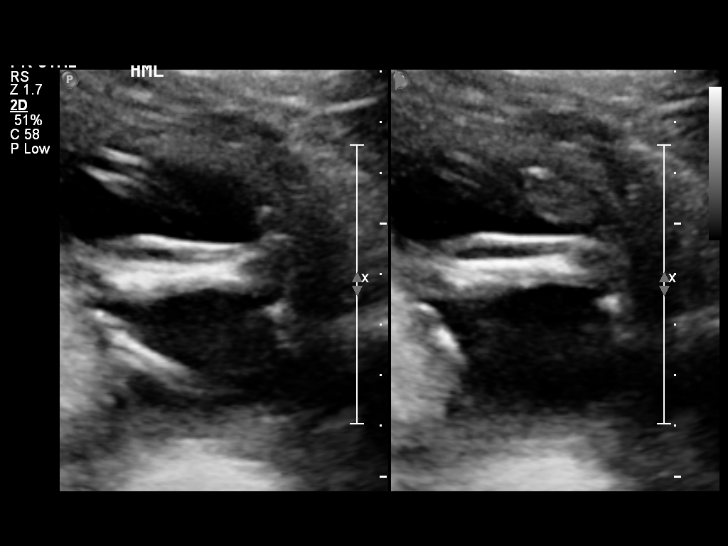
[im 80/80]
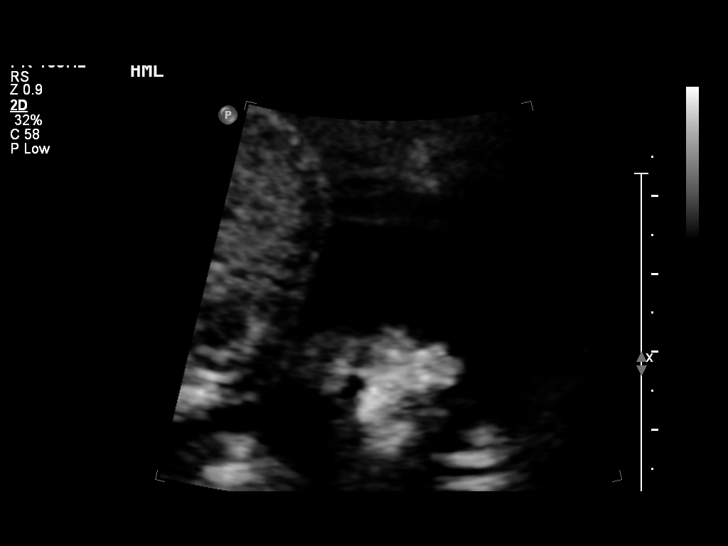

[Series 1: us ob detail +14 wk · 8 acquisitions, 1 frame shown (2 of 2)]
[im 4/8]
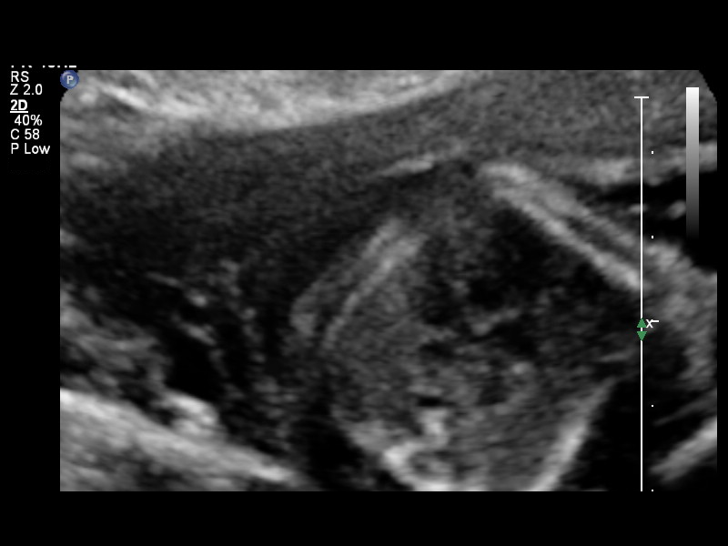

[12 of 28 positions shown; findings below may reference images not displayed]

OBSTETRICS REPORT
                      (Signed Final 08/02/2012 [DATE])

Service(s) Provided

 US OB DETAIL + 14 WK                                  76811.0
Indications

 Detailed fetal anatomic survey
 Previous pre-term deliveries
 Previous cesarean section
 Poor obstetric history: Previous preeclampsia /
 eclampsia/gestational HTN
Fetal Evaluation

 Num Of Fetuses:    1
 Fetal Heart Rate:  144                         bpm
 Cardiac Activity:  Observed
 Presentation:      Variable
 Placenta:          Posterior, above cervical
                    os
 P. Cord            Visualized, central
 Insertion:

 Amniotic Fluid
 AFI FV:      Subjectively within normal limits
                                             Larg Pckt:     4.5  cm
Biometry

 BPD:     42.1  mm    G. Age:   18w 5d                CI:        73.77   70 - 86
                                                      FL/HC:      17.7   15.8 -
                                                                         18
 HC:     155.7  mm    G. Age:   18w 3d       60  %    HC/AC:      1.18   1.07 -

 AC:     131.8  mm    G. Age:   18w 5d       65  %    FL/BPD:
 FL:      27.5  mm    G. Age:   18w 3d       54  %    FL/AC:      20.9   20 - 24
 HUM:     27.7  mm    G. Age:   18w 6d       76  %
 CER:     19.2  mm    G. Age:   18w 4d       64  %
 NFT:     2.98  mm

 Est. FW:     246  gm      0 lb 9 oz     55  %
Gestational Age

 LMP:           18w 1d       Date:   03/28/12                 EDD:   01/02/13
 U/S Today:     18w 4d                                        EDD:   12/30/12
 Best:          18w 1d    Det. By:   LMP  (03/28/12)          EDD:   01/02/13
2nd Trimester Genetic Sonogram - Trisomy 21 Screening

 Age:                                             30          Risk=1:   641

 Structural anomalies (inc. cardiac):             No
 Echogenic bowel:                                 No
 Hypoplastic / absent midphalanx 5th Digit:       No
 Wide space 8st-7nd toes:                         N/A
 Pyelectasis:                                     No
 2-vessel umbilical cord:                         No
 Echogenic cardiac foci:                          No
Anatomy

 Cranium:          Appears normal         Aortic Arch:      Appears normal
 Fetal Cavum:      Appears normal         Ductal Arch:      Appears normal
 Ventricles:       Appears normal         Diaphragm:        Appears normal
 Choroid Plexus:   Appears normal         Stomach:          Appears normal, left
                                                            sided
 Cerebellum:       Appears normal         Abdomen:          Appears normal
 Posterior Fossa:  Appears normal         Abdominal Wall:   Appears nml (cord
                                                            insert, abd wall)
 Nuchal Fold:      Appears normal         Cord Vessels:     Appears normal (3
                                                            vessel cord)
 Face:             Appears normal         Kidneys:          Appear normal
                   (orbits and profile)
 Lips:             Appears normal         Bladder:          Appears normal
 Heart:            Appears normal         Spine:            Appears normal
                   (4CH, axis, and
                   situs)
 RVOT:             Appears normal         Lower             Appears normal
                                          Extremities:
 LVOT:             Appears normal         Upper             Appears normal
                                          Extremities:

 Other:  Fetus appears to be a female. Heels visualized. 5th digit visualized.
         Nasal bone visualized. Technically difficult due to fetal position.
Targeted Anatomy

 Fetal Central Nervous System
 Lat. Ventricles:  5.7                    Cisterna Magna:
Cervix Uterus Adnexa

 Cervical Length:   4.32      cm

 Cervix:       Normal appearance by transabdominal scan.
 Left Ovary:   Within normal limits.
 Right Ovary:  Within normal limits.

 Adnexa:     No abnormality visualized.
Impression

 Single living IUP with assigned GA of 18w 1d. EGA by
 ultrasound is concordant with dating by LMP.
 No fetal anatomic abnormality is identified.
 Normal amniotic fluid volume and cervical length.
 questions or concerns.

## 2015-05-11 ENCOUNTER — Ambulatory Visit (INDEPENDENT_AMBULATORY_CARE_PROVIDER_SITE_OTHER): Payer: BLUE CROSS/BLUE SHIELD | Admitting: Obstetrics & Gynecology

## 2015-05-11 ENCOUNTER — Encounter: Payer: Self-pay | Admitting: Obstetrics & Gynecology

## 2015-05-11 VITALS — BP 131/88 | HR 64 | Resp 18 | Ht 63.0 in | Wt 144.0 lb

## 2015-05-11 DIAGNOSIS — N938 Other specified abnormal uterine and vaginal bleeding: Secondary | ICD-10-CM | POA: Diagnosis not present

## 2015-05-11 DIAGNOSIS — Z113 Encounter for screening for infections with a predominantly sexual mode of transmission: Secondary | ICD-10-CM | POA: Diagnosis not present

## 2015-05-11 LAB — CBC
HCT: 41.5 % (ref 36.0–46.0)
Hemoglobin: 13.5 g/dL (ref 12.0–15.0)
MCH: 27.1 pg (ref 26.0–34.0)
MCHC: 32.5 g/dL (ref 30.0–36.0)
MCV: 83.2 fL (ref 78.0–100.0)
MPV: 10.3 fL (ref 8.6–12.4)
Platelets: 322 10*3/uL (ref 150–400)
RBC: 4.99 MIL/uL (ref 3.87–5.11)
RDW: 14 % (ref 11.5–15.5)
WBC: 6.7 10*3/uL (ref 4.0–10.5)

## 2015-05-11 NOTE — Progress Notes (Signed)
Pt here today for irregular vaginal bleeding and pain with bleeding.  Also c/o low sex drive and pain with intercourse.

## 2015-05-11 NOTE — Progress Notes (Signed)
   Subjective:    Patient ID: Alisha Hughes, female    DOB: [birth year], 35 y.o.   MRN: [id number]  HPI 35 yo MW P2 here with the issue of DUB. She was seen for this issue last year and her insurance wouldn't cover her u/s so she didn't get it. Her periods became normal until this month when she started having 2 periods again. She has also been experiencing pelvic pain, with and without her period. She also reports about 6 months of dyspareunia.   Review of Systems She has had a BTL.    Objective:   Physical Exam WNWHWFNAD Breathing, conversing, and ambulating normally Abd- benign NSSA, mobile, somewhat tender with palpation Adnexa - normal       Assessment & Plan:  DUB- check CBC, cervical cultures and gyn u/s

## 2015-05-14 LAB — URINE CYTOLOGY ANCILLARY ONLY
CHLAMYDIA, DNA PROBE: NEGATIVE
NEISSERIA GONORRHEA: NEGATIVE

## 2015-05-15 ENCOUNTER — Ambulatory Visit
Admission: RE | Admit: 2015-05-15 | Discharge: 2015-05-15 | Disposition: A | Discharge status: Home / Self Care | Payer: BLUE CROSS/BLUE SHIELD | Source: Ambulatory Visit | Attending: Obstetrics & Gynecology | Admitting: Obstetrics & Gynecology

## 2015-05-15 DIAGNOSIS — Z8541 Personal history of malignant neoplasm of cervix uteri: Secondary | ICD-10-CM | POA: Insufficient documentation

## 2015-05-15 DIAGNOSIS — N938 Other specified abnormal uterine and vaginal bleeding: Secondary | ICD-10-CM | POA: Insufficient documentation

## 2015-05-22 ENCOUNTER — Ambulatory Visit (INDEPENDENT_AMBULATORY_CARE_PROVIDER_SITE_OTHER): Payer: BLUE CROSS/BLUE SHIELD | Admitting: Obstetrics & Gynecology

## 2015-05-22 ENCOUNTER — Ambulatory Visit: Payer: BLUE CROSS/BLUE SHIELD | Admitting: Obstetrics & Gynecology

## 2015-05-22 VITALS — BP 143/96 | HR 69 | Resp 18 | Ht 63.0 in | Wt 148.0 lb

## 2015-05-22 DIAGNOSIS — R6882 Decreased libido: Secondary | ICD-10-CM | POA: Diagnosis not present

## 2015-05-22 DIAGNOSIS — G8929 Other chronic pain: Secondary | ICD-10-CM | POA: Diagnosis not present

## 2015-05-22 DIAGNOSIS — N949 Unspecified condition associated with female genital organs and menstrual cycle: Secondary | ICD-10-CM

## 2015-05-22 DIAGNOSIS — R102 Pelvic and perineal pain: Secondary | ICD-10-CM

## 2015-05-22 DIAGNOSIS — N938 Other specified abnormal uterine and vaginal bleeding: Secondary | ICD-10-CM | POA: Diagnosis not present

## 2015-05-22 NOTE — Progress Notes (Signed)
Pt here today to follow-up pelvic pain, decreased sex drive, and irregular menstrual cycles.  Currently using Ibuprofen for the pain with little relief.

## 2015-05-22 NOTE — Progress Notes (Signed)
   Subjective:    Patient ID: Alisha Hughes, female    DOB: July 02, 1980, 35 y.o.   MRN: [id number]  HPI 35 yo MW P2 (7 and 32 yo kids) here to discuss her u/s findings (done for CPP and DUB). She also wants to discuss decreased which has been present for about 3 years. I prescribed Addyi and she took it for about 5 months with no effect. The pelvic pain is very intense, also with dyspareunia, some radiaton to the front of legs. She tried OCPs with no effect. She has tried depo provera but gained weight.   Review of Systems Had a BTL    Objective:   Physical Exam [id number] Breathing, conversing, and ambulating normally U/s was normal        Assessment & Plan:  Decreased libido - will try test ointment 2% 3 times per week DUB/CP I have offered a diagnostic laparoscopy along with an endometrial ablation.

## 2015-05-24 ENCOUNTER — Telehealth: Payer: Self-pay | Admitting: *Deleted

## 2015-05-24 ENCOUNTER — Encounter (HOSPITAL_COMMUNITY): Payer: Self-pay | Admitting: *Deleted

## 2015-05-24 DIAGNOSIS — R102 Pelvic and perineal pain: Secondary | ICD-10-CM

## 2015-05-24 MED ORDER — DICLOFENAC SODIUM 75 MG PO TBEC: 75.0000 mg | DELAYED_RELEASE_TABLET | Freq: Two times a day (BID) | ORAL | Status: DC

## 2015-05-24 NOTE — Telephone Encounter (Signed)
Pt c/o of increasing pelvic discomfort today, surgery scheduled for April.  Sent Diclofenac 75mg  to pharmacy per protocol, also recommended Tylenol use between doses to help as well.  Pt acknowledged instructions.

## 2015-05-27 IMAGING — US US FETAL BPP W/O NONSTRESS
1 series · 13 of 20 positions shown · non-contrast
Comparison: none

[Series 1: us fetal bpp w/o nonstress · non-contrast · 20 acquisitions, 13 frames shown]
[im 1/20]
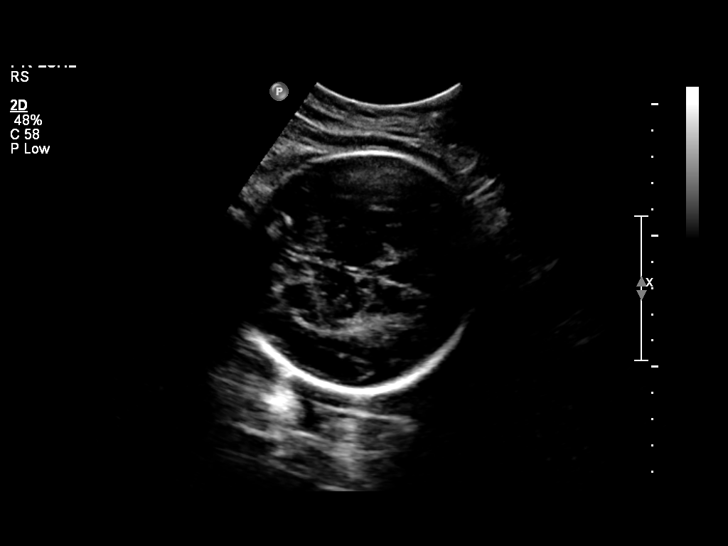
[im 3/20]
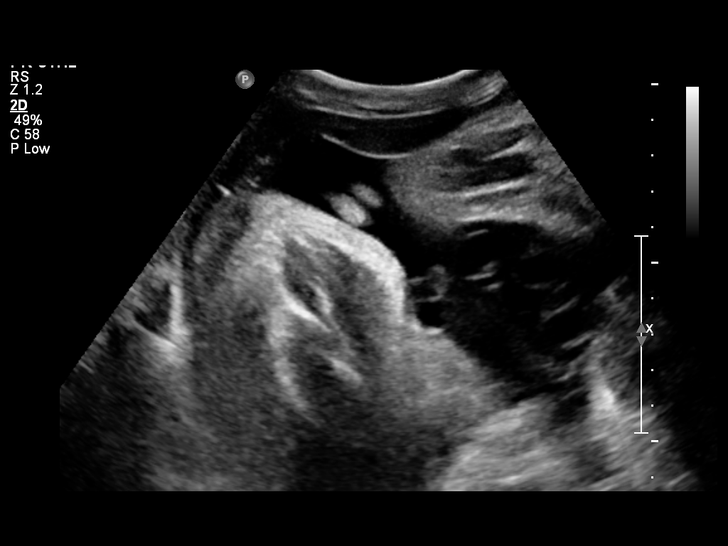
[im 4/20]
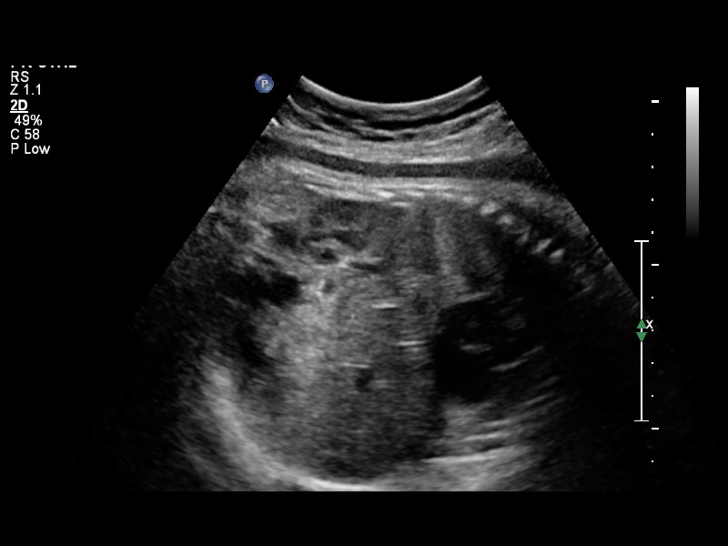
[im 6/20]
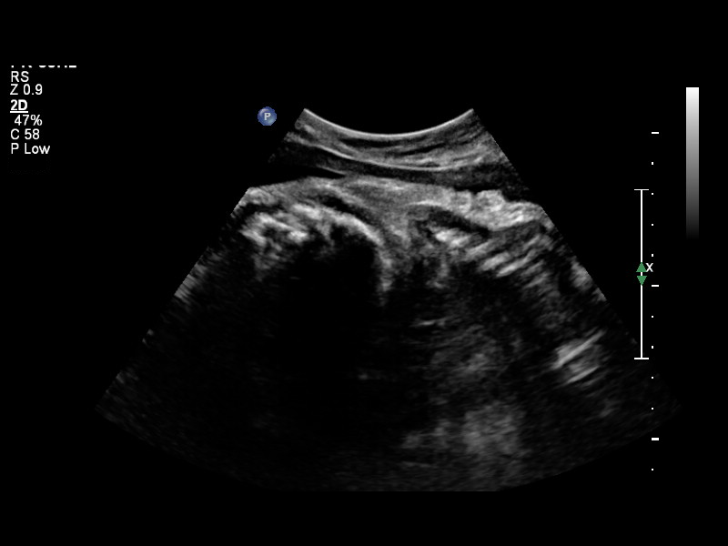
[im 7/20]
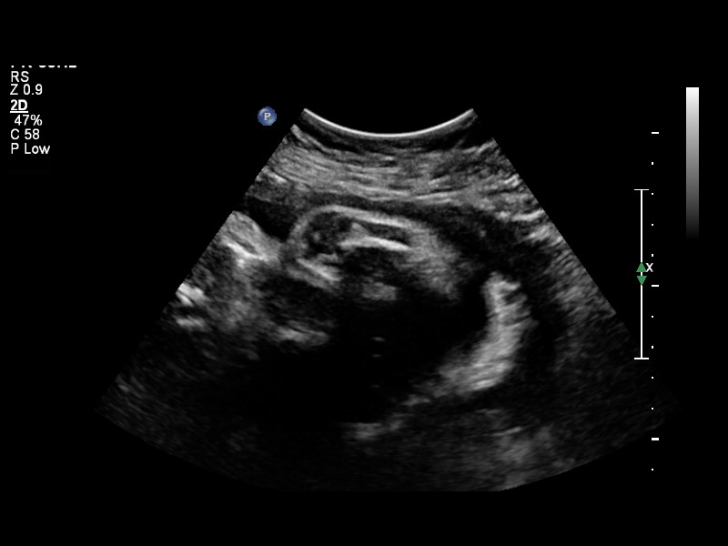
[im 9/20]
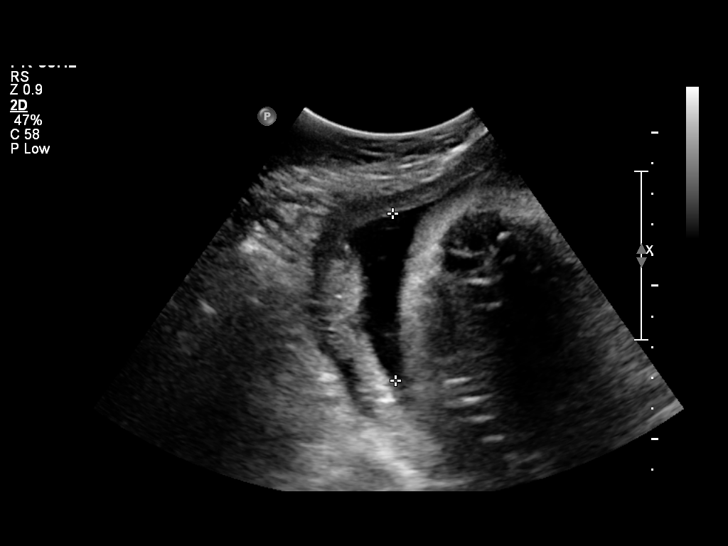
[im 11/20]
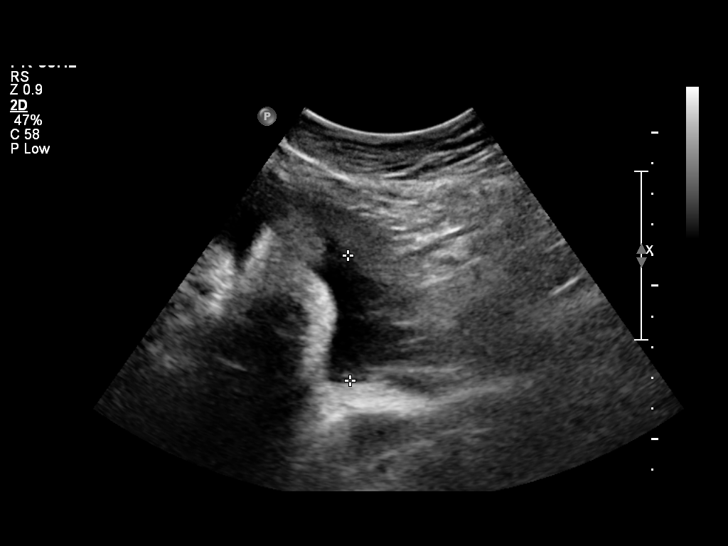
[im 12/20]
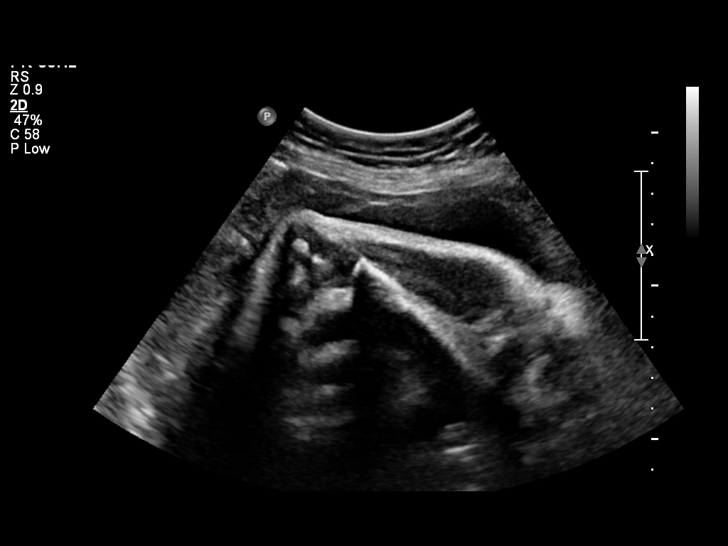
[im 14/20]
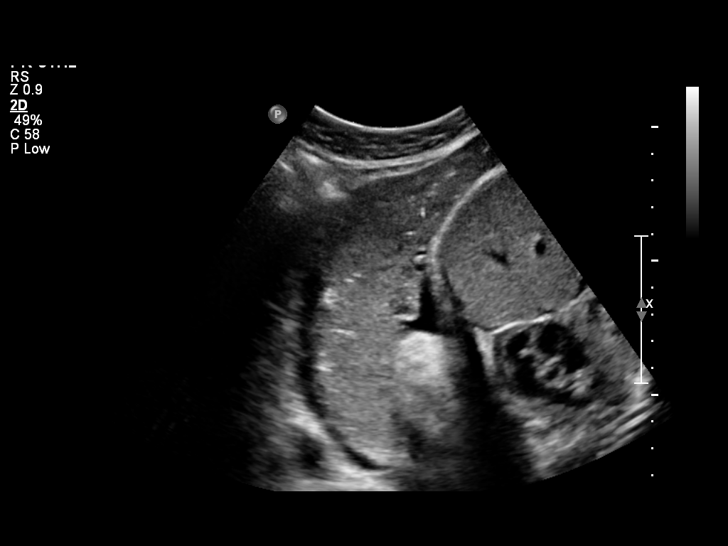
[im 15/20]
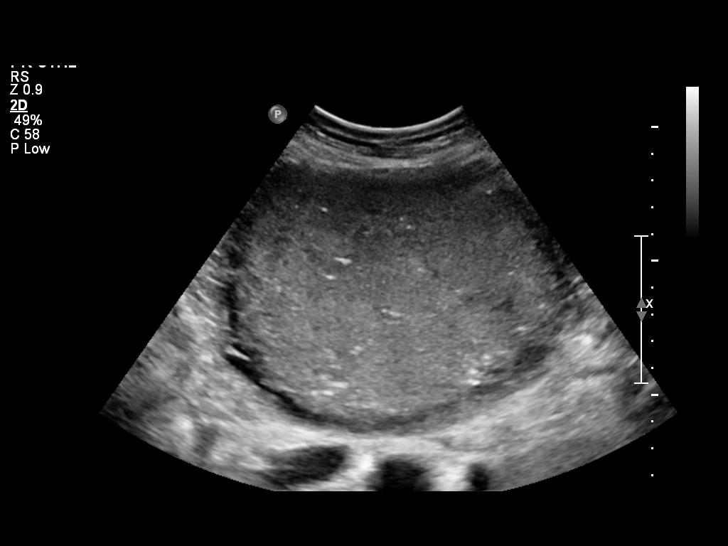
[im 17/20]
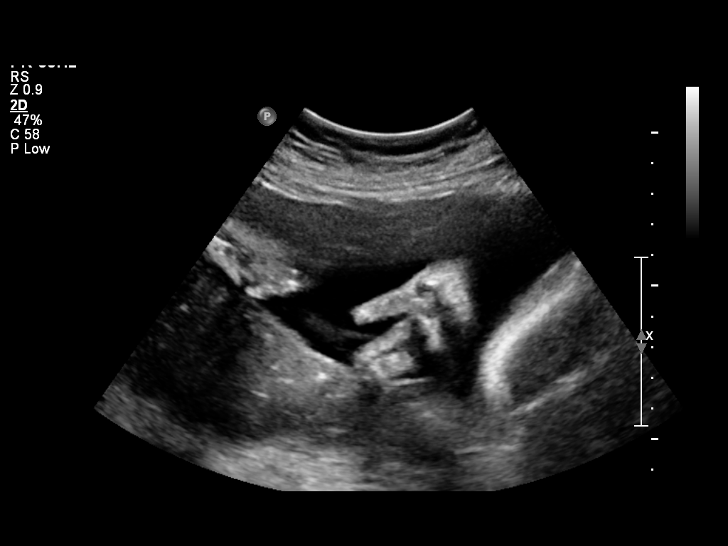
[im 18/20]
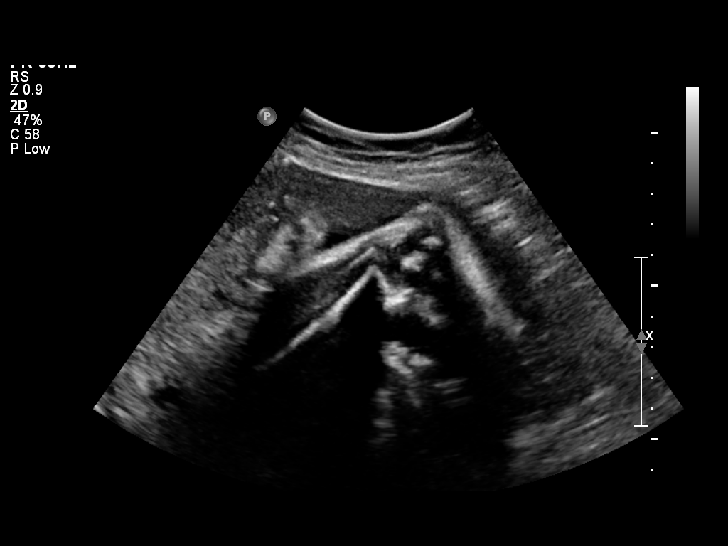
[im 20/20]
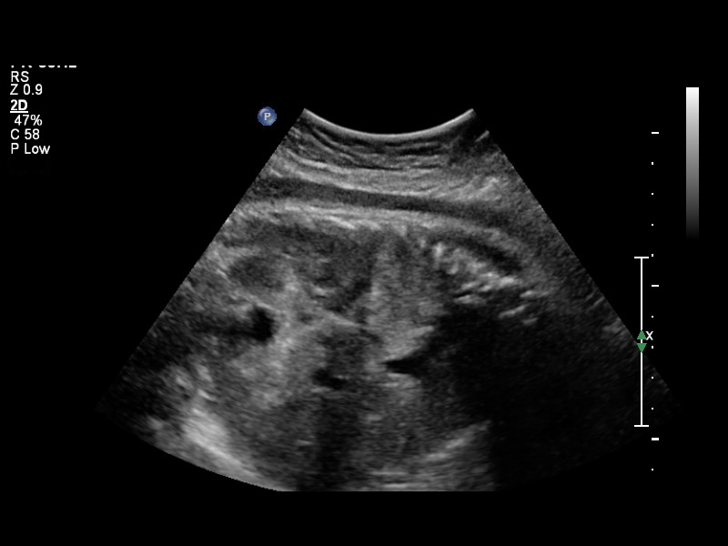

[13 of 20 positions shown; findings below may reference images not displayed]

OBSTETRICS REPORT
                      (Signed Final 11/21/2012 [DATE])

Service(s) Provided

Indications

 Increased blood pressure/headache
 Decreased fetal movement
 Previous cesarean section
 Prior preterm delivery (36 weeks preeclampsia)
Fetal Evaluation

 Num Of Fetuses:    1
 Fetal Heart Rate:  129                          bpm
 Cardiac Activity:  Observed
 Presentation:      Cephalic
 Placenta:          Fundal, above cervical os
 P. Cord            Previously seen as normal
 Insertion:

 Amniotic Fluid
 AFI FV:      Subjectively within normal limits
 AFI Sum:     17.43   cm       64  %Tile     Larg Pckt:    5.42  cm
 RUQ:   5.42    cm   RLQ:    3.84   cm    LUQ:   4.11    cm   LLQ:    4.06   cm
Biophysical Evaluation

 Amniotic F.V:   Pocket => 2 cm two         F. Tone:         Observed
                 planes
 F. Movement:    Observed                   Score:           [DATE]
 F. Breathing:   Observed
Gestational Age

 LMP:           33w 4d        Date:  03/28/12                 EDD:   01/02/13
 Best:          33w 4d     Det. By:  LMP  (03/28/12)          EDD:   01/02/13
Comments

 Study limited to evaluation of BPP.

 Fetal heart rate tracing is also reported to be reactive.

 First trimester screening indicates no increased risks for fetal
 aneuploidy.  MSAFP is also WNL.
Impression

 [place]leton IUP at 33 weeks 4 days
 Normal amniotic fluid volume
 Reassuring BPP
 Fundal placenta without sonographic abnormalities
 Cephalic presentation
Recommendations

 If blood pressures remain elevated or a diagnosis of
 preeclampsia is confirmed, evaluation of fetal growth by
 ultrasound and antenatal testing is recommended.

 questions or concerns.
                Nai, Gajak

## 2015-06-19 ENCOUNTER — Encounter (INDEPENDENT_AMBULATORY_CARE_PROVIDER_SITE_OTHER): Payer: Self-pay

## 2015-06-19 ENCOUNTER — Encounter: Payer: Self-pay | Admitting: Obstetrics & Gynecology

## 2015-06-19 ENCOUNTER — Ambulatory Visit (INDEPENDENT_AMBULATORY_CARE_PROVIDER_SITE_OTHER): Payer: Self-pay | Admitting: Obstetrics & Gynecology

## 2015-06-19 VITALS — BP 133/88 | HR 73 | Ht 63.0 in | Wt 148.0 lb

## 2015-06-19 DIAGNOSIS — R102 Pelvic and perineal pain: Secondary | ICD-10-CM

## 2015-06-19 MED ORDER — TRAMADOL HCL 50 MG PO TABS: 50.0000 mg | ORAL_TABLET | Freq: Four times a day (QID) | ORAL | Status: DC | PRN

## 2015-06-19 MED ORDER — DICLOFENAC SODIUM 75 MG PO TBEC: 75.0000 mg | DELAYED_RELEASE_TABLET | Freq: Two times a day (BID) | ORAL | Status: DC

## 2015-06-19 NOTE — Progress Notes (Signed)
Severe lower pelvic pain.  Rates pain at 9-10.  She is scheduled for exploratory surgery 07/19/15 with Dr. Hulan Fray, but the pain is increased over the last week.  "It hurts to walk".

## 2015-06-19 NOTE — Progress Notes (Signed)
Patient ID: Alisha Hughes, female   DOB: 31-Jul-1980, 35 y.o.   MRN: [id number] History:  35 y.o. G2P1101 here today for eval of pelvic pain. Pt is scheduled for surgery with Dr. Hulan Fray in <1 month. She reports tha there pain is worse and lasts longer than prev.  She reports taking hydrocodone at home with min relief.    She also reports continued abnormal bleeding.   The following portions of the patient's history were reviewed and updated as appropriate: allergies, current medications, past family history, past medical history, past social history, past surgical history and problem list.  Review of Systems:  Pertinent items are noted in HPI.  Objective:  Physical Exam Blood pressure 133/88, pulse 73, height 5\' 3"  (1.6 m), weight 148 lb (67.132 kg), last menstrual period 06/11/2015. Gen: NAD Lungs: CTA CV: RRR Abd: Soft, nontender and nondistended Pelvic: Normal appearing external genitalia; normal appearing vaginal mucosa and cervix.  Normal discharge.  Small uterus, no other palpable masses, no uterine or adnexal tenderness  Labs and Imaging 05/15/2015 CLINICAL DATA: Dysfunctional uterine bleeding, history of 2 previous Cesarean sections; history of stage I cervical malignancy ; onset of last normal menstrual period April 26, 2015  EXAM: TRANSABDOMINAL AND TRANSVAGINAL ULTRASOUND OF PELVIS  TECHNIQUE: Both transabdominal and transvaginal ultrasound examinations of the pelvis were performed. Transabdominal technique was performed for global imaging of the pelvis including uterus, ovaries, adnexal regions, and pelvic cul-de-sac. It was necessary to proceed with endovaginal exam following the transabdominal exam to visualize the uterus, endometrium, ovaries, and adnexal regions.  COMPARISON: OB ultrasound of November 18, 2012  FINDINGS: Uterus  Measurements: 7.9 x 3.9 x 4.3 cm. No fibroids or other mass visualized.  Endometrium  Thickness: 6.7 mm. No focal abnormality  visualized. No abnormal endometrial fluid collections.  Right ovary  Measurements: 2.5 x 1.7 x 1.9 cm. Multiple follicles are present.  Left ovary  Measurements: 2.6 x 2.0 x 2.1 cm. Multiple follicles are demonstrated.  Other findings  There is no free pelvic fluid.  IMPRESSION: 1. Normal appearance of the uterus and endometrium. If bleeding remains unresponsive to hormonal or medical therapy, sonohysterogram should be considered for focal lesion work-up. (Ref: Radiological Reasoning: Algorithmic Workup of Abnormal Vaginal Bleeding with Endovaginal Sonography and Sonohysterography. AJR 2008; [id number]) 2. Normal appearance of the ovaries. There is no adnexal mass or free pelvic fluid.   Assessment & Plan:  Pelvic pain- chronic.  Pt scheduled for surgery with Dr. Hulan Fray 07/19/2015.  Ultram 50mg  1 po q 6 hours prn Diclofenac 75 mg bid prn F/u for surgery as scheduled.  Bessye Stith L. Harraway-Smith, M.D., Cherlynn June

## 2015-06-19 NOTE — Patient Instructions (Signed)

## 2015-07-03 ENCOUNTER — Encounter (HOSPITAL_COMMUNITY): Payer: Self-pay

## 2015-07-17 ENCOUNTER — Ambulatory Visit (HOSPITAL_COMMUNITY): Payer: 59 | Admitting: Anesthesiology

## 2015-07-17 ENCOUNTER — Ambulatory Visit (HOSPITAL_COMMUNITY)
Admission: RE | Admit: 2015-07-17 | Discharge: 2015-07-17 | Disposition: A | Discharge status: Home / Self Care | Payer: 59 | Source: Ambulatory Visit | Attending: Obstetrics & Gynecology | Admitting: Obstetrics & Gynecology

## 2015-07-17 ENCOUNTER — Encounter (HOSPITAL_COMMUNITY)
Admission: RE | Disposition: A | Discharge status: Home / Self Care | Payer: Self-pay | Source: Ambulatory Visit | Attending: Obstetrics & Gynecology

## 2015-07-17 ENCOUNTER — Encounter (HOSPITAL_COMMUNITY): Payer: Self-pay

## 2015-07-17 DIAGNOSIS — R102 Pelvic and perineal pain: Secondary | ICD-10-CM | POA: Diagnosis not present

## 2015-07-17 DIAGNOSIS — N938 Other specified abnormal uterine and vaginal bleeding: Secondary | ICD-10-CM | POA: Diagnosis present

## 2015-07-17 DIAGNOSIS — F419 Anxiety disorder, unspecified: Secondary | ICD-10-CM | POA: Diagnosis not present

## 2015-07-17 DIAGNOSIS — I10 Essential (primary) hypertension: Secondary | ICD-10-CM | POA: Insufficient documentation

## 2015-07-17 DIAGNOSIS — G8929 Other chronic pain: Secondary | ICD-10-CM | POA: Diagnosis not present

## 2015-07-17 DIAGNOSIS — N941 Unspecified dyspareunia: Secondary | ICD-10-CM | POA: Insufficient documentation

## 2015-07-17 HISTORY — PX: LAPAROSCOPIC LYSIS OF ADHESIONS: SHX5905

## 2015-07-17 HISTORY — PX: ENDOMETRIAL ABLATION: SHX621

## 2015-07-17 HISTORY — PX: LAPAROSCOPY: SHX197

## 2015-07-17 LAB — CBC
HEMATOCRIT: 39.8 % (ref 36.0–46.0)
HEMOGLOBIN: 13 g/dL (ref 12.0–15.0)
MCH: 26.8 pg (ref 26.0–34.0)
MCHC: 32.7 g/dL (ref 30.0–36.0)
MCV: 82.1 fL (ref 78.0–100.0)
Platelets: 236 10*3/uL (ref 150–400)
RBC: 4.85 MIL/uL (ref 3.87–5.11)
RDW: 13.7 % (ref 11.5–15.5)
WBC: 5.7 10*3/uL (ref 4.0–10.5)

## 2015-07-17 LAB — PREGNANCY, URINE: Preg Test, Ur: NEGATIVE

## 2015-07-17 SURGERY — LAPAROSCOPY, DIAGNOSTIC
Anesthesia: General | Site: Vagina

## 2015-07-17 MED ORDER — LACTATED RINGERS IV SOLN
INTRAVENOUS | Status: DC | PRN
  Administered 2015-07-17 (×2): via INTRAVENOUS

## 2015-07-17 MED ORDER — FENTANYL CITRATE (PF) 100 MCG/2ML IJ SOLN
25.0000 ug | INTRAMUSCULAR | Status: DC | PRN
  Administered 2015-07-17 (×3): 50 ug via INTRAVENOUS

## 2015-07-17 MED ORDER — LIDOCAINE HCL (CARDIAC) 20 MG/ML IV SOLN
INTRAVENOUS | Status: DC | PRN
  Administered 2015-07-17: 100 mg via INTRAVENOUS

## 2015-07-17 MED ORDER — PROPOFOL 10 MG/ML IV BOLUS
INTRAVENOUS | Status: AC
  Filled 2015-07-17: qty 20

## 2015-07-17 MED ORDER — LACTATED RINGERS IV SOLN: INTRAVENOUS | Status: DC

## 2015-07-17 MED ORDER — MIDAZOLAM HCL 2 MG/2ML IJ SOLN
INTRAMUSCULAR | Status: AC
  Filled 2015-07-17: qty 2

## 2015-07-17 MED ORDER — SCOPOLAMINE 1 MG/3DAYS TD PT72
1.0000 | MEDICATED_PATCH | Freq: Once | TRANSDERMAL | Status: DC
  Administered 2015-07-17: 1.5 mg via TRANSDERMAL

## 2015-07-17 MED ORDER — ROCURONIUM BROMIDE 100 MG/10ML IV SOLN
INTRAVENOUS | Status: AC
  Filled 2015-07-17: qty 1

## 2015-07-17 MED ORDER — FENTANYL CITRATE (PF) 100 MCG/2ML IJ SOLN
INTRAMUSCULAR | Status: AC
  Filled 2015-07-17: qty 2

## 2015-07-17 MED ORDER — MIDAZOLAM HCL 2 MG/2ML IJ SOLN
INTRAMUSCULAR | Status: DC | PRN
Start: 2015-07-17 — End: 2015-07-17
  Administered 2015-07-17: 2 mg via INTRAVENOUS

## 2015-07-17 MED ORDER — PROPOFOL 10 MG/ML IV BOLUS
INTRAVENOUS | Status: DC | PRN
  Administered 2015-07-17: 150 mg via INTRAVENOUS
  Administered 2015-07-17: 50 mg via INTRAVENOUS

## 2015-07-17 MED ORDER — NEOSTIGMINE METHYLSULFATE 10 MG/10ML IV SOLN
INTRAVENOUS | Status: DC | PRN
  Administered 2015-07-17: 4 mg via INTRAVENOUS

## 2015-07-17 MED ORDER — FENTANYL CITRATE (PF) 250 MCG/5ML IJ SOLN
INTRAMUSCULAR | Status: AC
  Filled 2015-07-17: qty 5

## 2015-07-17 MED ORDER — FENTANYL CITRATE (PF) 100 MCG/2ML IJ SOLN
INTRAMUSCULAR | Status: DC | PRN
Start: 2015-07-17 — End: 2015-07-17
  Administered 2015-07-17: 50 ug via INTRAVENOUS
  Administered 2015-07-17: 100 ug via INTRAVENOUS
  Administered 2015-07-17 (×2): 50 ug via INTRAVENOUS
  Administered 2015-07-17: 100 ug via INTRAVENOUS

## 2015-07-17 MED ORDER — OXYCODONE-ACETAMINOPHEN 5-325 MG PO TABS
ORAL_TABLET | ORAL | Status: AC
  Filled 2015-07-17: qty 1

## 2015-07-17 MED ORDER — ONDANSETRON HCL 4 MG/2ML IJ SOLN
INTRAMUSCULAR | Status: AC
  Filled 2015-07-17: qty 2

## 2015-07-17 MED ORDER — LIDOCAINE HCL (CARDIAC) 20 MG/ML IV SOLN
INTRAVENOUS | Status: AC
  Filled 2015-07-17: qty 5

## 2015-07-17 MED ORDER — LACTATED RINGERS IV SOLN
INTRAVENOUS | Status: DC
  Administered 2015-07-17: 14:00:00 via INTRAVENOUS

## 2015-07-17 MED ORDER — ROCURONIUM BROMIDE 100 MG/10ML IV SOLN
INTRAVENOUS | Status: DC | PRN
  Administered 2015-07-17: 10 mg via INTRAVENOUS
  Administered 2015-07-17: 30 mg via INTRAVENOUS

## 2015-07-17 MED ORDER — OXYCODONE-ACETAMINOPHEN 5-325 MG PO TABS: 1.0000 | ORAL_TABLET | Freq: Four times a day (QID) | ORAL | Status: DC | PRN

## 2015-07-17 MED ORDER — KETOROLAC TROMETHAMINE 30 MG/ML IJ SOLN
INTRAMUSCULAR | Status: DC | PRN
  Administered 2015-07-17: 30 mg via INTRAVENOUS

## 2015-07-17 MED ORDER — SCOPOLAMINE 1 MG/3DAYS TD PT72
MEDICATED_PATCH | TRANSDERMAL | Status: AC
  Administered 2015-07-17: 1.5 mg via TRANSDERMAL
  Filled 2015-07-17: qty 1

## 2015-07-17 MED ORDER — OXYCODONE-ACETAMINOPHEN 5-325 MG PO TABS
1.0000 | ORAL_TABLET | ORAL | Status: DC | PRN
  Administered 2015-07-17: 1 via ORAL

## 2015-07-17 MED ORDER — DEXAMETHASONE SODIUM PHOSPHATE 10 MG/ML IJ SOLN
INTRAMUSCULAR | Status: DC | PRN
  Administered 2015-07-17: 4 mg via INTRAVENOUS

## 2015-07-17 MED ORDER — DEXAMETHASONE SODIUM PHOSPHATE 4 MG/ML IJ SOLN
INTRAMUSCULAR | Status: AC
  Filled 2015-07-17: qty 1

## 2015-07-17 MED ORDER — KETOROLAC TROMETHAMINE 30 MG/ML IJ SOLN
INTRAMUSCULAR | Status: AC
Start: 2015-07-17 — End: 2015-07-17
  Filled 2015-07-17: qty 1

## 2015-07-17 MED ORDER — GLYCOPYRROLATE 0.2 MG/ML IJ SOLN
INTRAMUSCULAR | Status: DC | PRN
  Administered 2015-07-17: 0.6 mg via INTRAVENOUS

## 2015-07-17 MED ORDER — BUPIVACAINE HCL (PF) 0.5 % IJ SOLN
INTRAMUSCULAR | Status: AC
  Filled 2015-07-17: qty 30

## 2015-07-17 SURGICAL SUPPLY — 34 items
APPLICATOR COTTON TIP 6IN STRL (MISCELLANEOUS) IMPLANT
CABLE HIGH FREQUENCY MONO STRZ (ELECTRODE) IMPLANT
CATH ROBINSON RED A/P 16FR (CATHETERS) ×4 IMPLANT
CLOTH BEACON ORANGE TIMEOUT ST (SAFETY) ×4 IMPLANT
DRSG COVADERM PLUS 2X2 (GAUZE/BANDAGES/DRESSINGS) IMPLANT
DRSG OPSITE POSTOP 3X4 (GAUZE/BANDAGES/DRESSINGS) IMPLANT
DURAPREP 26ML APPLICATOR (WOUND CARE) ×8 IMPLANT
ELECT REM PT RETURN 9FT ADLT (ELECTROSURGICAL)
ELECTRODE REM PT RTRN 9FT ADLT (ELECTROSURGICAL) IMPLANT
GLOVE BIO SURGEON STRL SZ 6.5 (GLOVE) ×4 IMPLANT
GLOVE BIOGEL PI IND STRL 7.0 (GLOVE) ×3 IMPLANT
GLOVE BIOGEL PI INDICATOR 7.0 (GLOVE) ×1
GOWN STRL REUS W/TWL LRG LVL3 (GOWN DISPOSABLE) ×8 IMPLANT
NDL SAFETY ECLIPSE 18X1.5 (NEEDLE) ×3 IMPLANT
NEEDLE HYPO 18GX1.5 SHARP (NEEDLE) ×1
NEEDLE INSUFFLATION 120MM (ENDOMECHANICALS) ×4 IMPLANT
NS IRRIG 1000ML POUR BTL (IV SOLUTION) ×4 IMPLANT
PACK LAPAROSCOPY BASIN (CUSTOM PROCEDURE TRAY) ×4 IMPLANT
PACK VAGINAL MINOR WOMEN LF (CUSTOM PROCEDURE TRAY) ×4 IMPLANT
PAD TRENDELENBURG POSITION (MISCELLANEOUS) ×4 IMPLANT
POUCH SPECIMEN RETRIEVAL 10MM (ENDOMECHANICALS) IMPLANT
SCALPEL HARMONIC ACE (MISCELLANEOUS) ×4 IMPLANT
SET GENESYS HTA PROCERVA (MISCELLANEOUS) ×4 IMPLANT
SET IRRIG TUBING LAPAROSCOPIC (IRRIGATION / IRRIGATOR) IMPLANT
SHEARS HARMONIC ACE PLUS 36CM (ENDOMECHANICALS) IMPLANT
SLEEVE XCEL OPT CAN 5 100 (ENDOMECHANICALS) ×8 IMPLANT
STRIP CLOSURE SKIN 1/2X4 (GAUZE/BANDAGES/DRESSINGS) ×4 IMPLANT
SUT VICRYL 0 UR6 27IN ABS (SUTURE) ×8 IMPLANT
SUT VICRYL 4-0 PS2 18IN ABS (SUTURE) ×8 IMPLANT
TOWEL OR 17X24 6PK STRL BLUE (TOWEL DISPOSABLE) ×8 IMPLANT
TROCAR OPTI TIP 5M 100M (ENDOMECHANICALS) ×4 IMPLANT
TROCAR XCEL NON-BLD 11X100MML (ENDOMECHANICALS) ×4 IMPLANT
WARMER LAPAROSCOPE (MISCELLANEOUS) ×4 IMPLANT
WATER STERILE IRR 1000ML POUR (IV SOLUTION) IMPLANT

## 2015-07-17 NOTE — Anesthesia Procedure Notes (Signed)
Procedure Name: Intubation Date/Time: 07/17/2015 3:38 PM Performed by: Devere Brem, Sheron Nightingale Pre-anesthesia Checklist: Patient identified, Timeout performed, Emergency Drugs available, Suction available and Patient being monitored Patient Re-evaluated:Patient Re-evaluated prior to inductionOxygen Delivery Method: Circle system utilized Preoxygenation: Pre-oxygenation with 100% oxygen Intubation Type: IV induction Laryngoscope Size: Mac and 3 Grade View: Grade I Tube type: Oral Tube size: 7.0 mm Number of attempts: 1 Placement Confirmation: ETT inserted through vocal cords under direct vision,  positive ETCO2 and breath sounds checked- equal and bilateral Secured at: 21 cm Dental Injury: Teeth and Oropharynx as per pre-operative assessment

## 2015-07-17 NOTE — Anesthesia Preprocedure Evaluation (Addendum)
Anesthesia Evaluation  Patient identified by MRN, date of birth, ID band Patient awake    Reviewed: Allergy & Precautions, H&P , NPO status , Patient's Chart, lab work & pertinent test results  Airway Mallampati: II  TM Distance: >3 FB Neck ROM: full    Dental no notable dental hx. (+) Dental Advisory Given, Teeth Intact   Pulmonary neg pulmonary ROS,    Pulmonary exam normal breath sounds clear to auscultation       Cardiovascular Exercise Tolerance: Good hypertension, negative cardio ROS Normal cardiovascular exam Rhythm:regular Rate:Normal  PIH   Neuro/Psych negative neurological ROS  negative psych ROS   GI/Hepatic negative GI ROS, Neg liver ROS,   Endo/Other  negative endocrine ROS  Renal/GU negative Renal ROS  negative genitourinary   Musculoskeletal   Abdominal   Peds  Hematology negative hematology ROS (+)   Anesthesia Other Findings   Reproductive/Obstetrics negative OB ROS                            Anesthesia Physical Anesthesia Plan  ASA: II  Anesthesia Plan: General   Post-op Pain Management:    Induction: Intravenous  Airway Management Planned: Oral ETT  Additional Equipment:   Intra-op Plan:   Post-operative Plan: Extubation in OR  Informed Consent: I have reviewed the patients History and Physical, chart, labs and discussed the procedure including the risks, benefits and alternatives for the proposed anesthesia with the patient or authorized representative who has indicated his/her understanding and acceptance.   Dental Advisory Given  Plan Discussed with: CRNA  Anesthesia Plan Comments:        Anesthesia Quick Evaluation

## 2015-07-17 NOTE — Op Note (Signed)
07/17/2015  4:59 PM  PATIENT:  Alisha Hughes  35 y.o. female  PRE-OPERATIVE DIAGNOSIS:   dysfunctional uterine bleeding, chronic pelvic pain  POST-OPERATIVE DIAGNOSIS:  dysfunctionaluterine bleeding, chronic pelvic pain  PROCEDURE:  Procedure(s): LAPAROSCOPY DIAGNOSTIC (N/A) ENDOMETRIAL ABLATION WITH HTA, DILATATION AND CURRATAGE (N/A) LAPAROSCOPIC LYSIS OF ADHESIONS  SURGEON:  Surgeon(s) and Role:    * Emily Filbert, MD - Primary  ANESTHESIA:   general  EBL:  Total I/O In: 1000 [I.V.:1000] Out: 120 [Urine:100; Blood:20]  BLOOD ADMINISTERED:none  DRAINS: none   LOCAL MEDICATIONS USED:  MARCAINE     SPECIMEN:  Source of Specimen:  uterine curettings  DISPOSITION OF SPECIMEN:  PATHOLOGY  COUNTS:  YES  TOURNIQUET:  * No tourniquets in log *  DICTATION: .Dragon Dictation  PLAN OF CARE: Discharge to home after PACU  PATIENT DISPOSITION:  PACU - hemodynamically stable.   Delay start of Pharmacological VTE agent (>24hrs) due to surgical blood loss or risk of bleeding: not applicable  The risks, benefits, and alternatives of surgery were explained, understood, accepted. In the operating room she was placed in the dorsal lithotomy position, and general anesthesia was given without complication. Her abdomen and vagina were prepped and draped in the usual sterile fashion. A timeout procedure was done. A bimanual exam revealed a small retroverted and retroflexed and mobile uterus. Her adnexa felt normal. A Hulka manipulator was placed. Her bladder was emptied with a Robinson catheter. Gloves were changed, and attention was turned to the abdomen. Approximately the 5 mL of 0.5% Marcaine was injected into the umbilicus. A vertical incision was made at the site. A varies needle was placed intraperitoneally. Low-flow CO2 was used to insufflate the abdomen to approximately 3-1/2 L. Once a good pneumoperitoneum was established, a 5 mm Excel trocar was placed. Laparoscopy confirmed correct  placement. A 5 mm port was placed in the left lower quadrant  under direct laparoscopic visualization after injecting 0.5% Marcaine in the incision sites. Her pelvis and upper abdomen appeared normal. However, from the umbilicus to most of the middle and left anterior abdominal wall there were dense omental adhesion. I placed a third 5 mm port, this time in the right lower quadrant. I used a  Harmonic scapel to take down all of those adhesions. Hemostasis was noted. There was no evidence of endometriosis or other etiologies for her pain in the pelvis. Filsche clips were across both tubes in the isthmic region. The CO2 was allowed to escape from the abdomen. Dermabond was used to close the incisions. A Steri-Strip was placed across each incision.  At this time, I proceeded with the dilation of her cervix. The uterus measured 9 cm. I did a curettage of all quadrants of the uterus. Hysteroscopy showed no abnormalities. I then did the HTA proceed according to device guidelines. At the end of the cooldown, I removed the device. There was some bleeding from the cervix where the tenaculum had been. I obtained hemostasis by placing a figure of eight 2-0 vicryl suture. She was extubated and taken to the recovery room in stable condition.

## 2015-07-17 NOTE — Transfer of Care (Signed)
Immediate Anesthesia Transfer of Care Note  Patient: Alisha Hughes  Procedure(s) Performed: Procedure(s): LAPAROSCOPY DIAGNOSTIC (N/A) ENDOMETRIAL ABLATION WITH HTA, DILATATION AND CURRATAGE (N/A) LAPAROSCOPIC LYSIS OF ADHESIONS  Patient Location: PACU  Anesthesia Type:General  Level of Consciousness: awake, alert  and oriented  Airway & Oxygen Therapy: Patient Spontanous Breathing and Patient connected to nasal cannula oxygen  Post-op Assessment: Report given to RN and Post -op Vital signs reviewed and stable  Post vital signs: Reviewed and stable  Last Vitals:  Filed Vitals:   07/17/15 1325 07/17/15 1406  BP: 140/112 133/96  Pulse: 85   Temp: 36.7 C   Resp: 18     Complications: No apparent anesthesia complications

## 2015-07-17 NOTE — H&P (Signed)
Alisha Hughes is an 35 yo MW P2 here with the issue of DUB. She was seen for this issue last year and her insurance wouldn't cover her u/s so she didn't get it. Her periods became normal until this month when she started having 2 periods again. She has also been experiencing pelvic pain, with and without her period. She also reports about 6 months of dyspareunia. Her most recent u/s was entirely normal.    Patient's last menstrual period was 07/03/2015 (exact date).    Past Medical History  Diagnosis Date  . Anxiety   . Abnormal Pap smear   . Preterm labor   . Headache(784.0)     migranes.  . Ovarian cyst   . UTI (lower urinary tract infection)   . Pregnancy induced hypertension     fluctuates currently, unable to see PCP, scheduling an appointment    Past Surgical History  Procedure Laterality Date  . Colposcopy vulva w/ biopsy      abnormal pap  . Cesarean section    . Colonoscopy      2009  . Cholecystectomy      2009  . Wisdom tooth extraction      x4  . Wisdom tooth extraction    . Cryotherapy    . Cesarean section N/A 12/19/2012    Procedure: Repeat cesarean section with delivery of baby girl at [facility].    Bilateral tubal ligation with filshie clips.;  Surgeon: Donnamae Jude, MD;  Location: [facility];  Service: Obstetrics;  Laterality: N/A;  . Upper gi endoscopy    . Knee arthroscopy    . Tubal ligation      Family History  Problem Relation Age of Onset  . Hypertension Mother   . Anuerysm Mother     brain  . Heart disease Father   . Hypertension Father   . Hyperlipidemia Father   . Depression Brother   . Multiple sclerosis Maternal Grandmother   . Heart disease Maternal Grandfather   . Heart disease Paternal Grandmother   . Parkinson's disease Paternal Grandfather     Social History:  reports that she has never smoked. She has never used smokeless tobacco. She reports that she drinks alcohol. She reports that she does not use illicit drugs.  Allergies:   Allergies  Allergen Reactions  . Latex Rash  . Reglan [Metoclopramide] Nausea Only    Prescriptions prior to admission  Medication Sig Dispense Refill Last Dose  . CLONAZEPAM PO Take 1 tablet by mouth as needed (anxiety). Pt unsure of strength   Past Week at Unknown time  . sertraline (ZOLOFT) 100 MG tablet Take 100 mg by mouth daily.   Past Week at Unknown time  . topiramate (TOPAMAX) 100 MG tablet Take 100 mg by mouth daily.   Past Week at Unknown time  . diclofenac (VOLTAREN) 75 MG EC tablet Take 1 tablet (75 mg total) by mouth 2 (two) times daily with a meal. (Patient not taking: Reported on 07/03/2015) 60 tablet 2   . ibuprofen (ADVIL,MOTRIN) 600 MG tablet Take 600 mg by mouth every 6 (six) hours as needed.   Taking  . traMADol (ULTRAM) 50 MG tablet Take 1 tablet (50 mg total) by mouth every 6 (six) hours as needed. (Patient not taking: Reported on 07/03/2015) 60 tablet 0     ROS  Works at [employer] in [place]  Blood pressure 133/96, pulse 85, temperature 98.1 F (36.7 C), temperature source Oral, resp. rate 18, height  5\' 3"  (1.6 m), weight 67.586 kg (149 lb), last menstrual period 07/03/2015, SpO2 100 %. Physical Exam  Heart- rrr Lungs- CTAB Abd- benign  Results for orders placed or performed during the hospital encounter of 07/17/15 (from the past 24 hour(s))  Pregnancy, urine     Status: None   Collection Time: 07/17/15  1:14 PM  Result Value Ref Range   Preg Test, Ur NEGATIVE NEGATIVE  CBC     Status: None   Collection Time: 07/17/15  1:22 PM  Result Value Ref Range   WBC 5.7 4.0 - 10.5 K/uL   RBC 4.85 3.87 - 5.11 MIL/uL   Hemoglobin 13.0 12.0 - 15.0 g/dL   HCT 39.8 36.0 - 46.0 %   MCV 82.1 78.0 - 100.0 fL   MCH 26.8 26.0 - 34.0 pg   MCHC 32.7 30.0 - 36.0 g/dL   RDW 13.7 11.5 - 15.5 %   Platelets 236 150 - 400 K/uL    No results found.  Assessment/Plan: Chronic pelvic pain- diagnostic laparoscopy DUB- plan for HTA  She understands the risks of surgery,  including, but not to infection, bleeding, DVTs, damage to bowel, bladder, ureters. She wishes to proceed.     Tabby Beaston C. 07/17/2015, 2:54 PM

## 2015-07-17 NOTE — Discharge Instructions (Addendum)
Dilation and Curettage or Vacuum Curettage, Care After Refer to this sheet in the next few weeks. These instructions provide you with information on caring for yourself after your procedure. Your health care provider may also give you more specific instructions. Your treatment has been planned according to current medical practices, but problems sometimes occur. Call your health care provider if you have any problems or questions after your procedure. WHAT TO EXPECT AFTER THE PROCEDURE After your procedure, it is typical to have light cramping and bleeding. This may last for 2 days to 2 weeks after the procedure. HOME CARE INSTRUCTIONS   Do not drive for 24 hours.  Wait 1 week before returning to strenuous activities.  Take your temperature 2 times a day for 4 days and write it down. Provide these temperatures to your health care provider if you develop a fever.  Avoid long periods of standing.  Avoid heavy lifting, pushing, or pulling. Do not lift anything heavier than 10 pounds (4.5 kg).  Limit stair climbing to once or twice a day.  Take rest periods often.  You may resume your usual diet.  Drink enough fluids to keep your urine clear or pale yellow.  Your usual bowel function should return. If you have constipation, you may:  Take a mild laxative with permission from your health care provider.  Add fruit and bran to your diet.  Drink more fluids.  Take showers instead of baths until your health care provider gives you permission to take baths.  Do not go swimming or use a hot tub until your health care provider approves.  Try to have someone with you or available to you the first 24-48 hours, especially if you were given a general anesthetic.  Do not douche, use tampons, or have sex (intercourse) for 2 weeks after the procedure.  Only take over-the-counter or prescription medicines as directed by your health care provider. Do not take aspirin. It can cause  bleeding.  Follow up with your health care provider as directed. SEEK MEDICAL CARE IF:   You have increasing cramps or pain that is not relieved with medicine.  You have abdominal pain that does not seem to be related to the same area of earlier cramping and pain.  You have bad smelling vaginal discharge.  You have a rash.  You are having problems with any medicine. SEEK IMMEDIATE MEDICAL CARE IF:   You have bleeding that is heavier than a normal menstrual period.  You have a fever.  You have chest pain.  You have shortness of breath.  You feel dizzy or feel like fainting.  You pass out.  You have pain in your shoulder strap area.  You have heavy vaginal bleeding with or without blood clots. MAKE SURE YOU:   Understand these instructions.  Will watch your condition.  Will get help right away if you are not doing well or get worse.   This information is not intended to replace advice given to you by your health care provider. Make sure you discuss any questions you have with your health care provider.   Document Released: 03/28/2000 Document Revised: 04/05/2013 Document Reviewed: 10/28/2012 Elsevier Interactive Patient Education 2016 [place] INSTRUCTIONS: Laparoscopy  The following instructions have been prepared to help you care for yourself upon your return home today.  Wound care:  Do not get the incision wet for the first 24 hours. The incision should be kept clean and dry.  The Band-Aids or dressings may be  removed the day after surgery.  Should the incision become sore, red, and swollen after the first week, check with your doctor.  Personal hygiene:  Shower the day after your procedure.  Activity and limitations:  Do NOT drive or operate any equipment today.  Do NOT lift anything more than 15 pounds for 2-3 weeks after surgery.  Do NOT rest in bed all day.  Walking is encouraged. Walk each day, starting slowly with 5-minute  walks 3 or 4 times a day. Slowly increase the length of your walks.  Walk up and down stairs slowly.  Do NOT do strenuous activities, such as golfing, playing tennis, bowling, running, biking, weight lifting, gardening, mowing, or vacuuming for 2-4 weeks. Ask your doctor when it is okay to start.  Diet: Eat a light meal as desired this evening. You may resume your usual diet tomorrow.  Return to work: This is dependent on the type of work you do. For the most part you can return to a desk job within a week of surgery. If you are more active at work, please discuss this with your doctor.  What to expect after your surgery: You may have a slight burning sensation when you urinate on the first day. You may have a very small amount of blood in the urine. Expect to have a small amount of vaginal discharge/light bleeding for 1-2 weeks. It is not unusual to have abdominal soreness and bruising for up to 2 weeks. You may be tired and need more rest for about 1 week. You may experience shoulder pain for 24-72 hours. Lying flat in bed may relieve it.  Call your doctor for any of the following:  Develop a fever of 100.4 or greater  Inability to urinate 6 hours after discharge from hospital  Severe pain not relieved by pain medications  Persistent of heavy bleeding at incision site  Redness or swelling around incision site after a week  Increasing nausea or vomiting  Patient Signature________________________________________ Nurse Signature_________________________________________DISCHARGE INSTRUCTIONS: HYSTEROSCOPY / ENDOMETRIAL ABLATION The following instructions have been prepared to help you care for yourself upon your return home.  May Remove Scop patch on or before  May take Ibuprofen after  May take stool softner while taking narcotic pain medication to prevent constipation.  Drink plenty of water.  Personal hygiene:  Use sanitary pads for vaginal drainage, not tampons.  Shower the  day after your procedure.  NO tub baths, pools or Jacuzzis for 2-3 weeks.  Wipe front to back after using the bathroom.  Activity and limitations:  Do NOT drive or operate any equipment for 24 hours. The effects of anesthesia are still present and drowsiness may result.  Do NOT rest in bed all day.  Walking is encouraged.  Walk up and down stairs slowly.  You may resume your normal activity in one to two days or as indicated by your physician. Sexual activity: NO intercourse for at least 2 weeks after the procedure, or as indicated by your Doctor.  Diet: Eat a light meal as desired this evening. You may resume your usual diet tomorrow.  Return to Work: You may resume your work activities in one to two days or as indicated by Marine scientist.  What to expect after your surgery: Expect to have vaginal bleeding/discharge for 2-3 days and spotting for up to 10 days. It is not unusual to have soreness for up to 1-2 weeks. You may have a slight burning sensation when you urinate for the first  day. Mild cramps may continue for a couple of days. You may have a regular period in 2-6 weeks.  Call your doctor for any of the following:  Excessive vaginal bleeding or clotting, saturating and changing one pad every hour.  Inability to urinate 6 hours after discharge from hospital.  Pain not relieved by pain medication.  Fever of 100.4 F or greater.  Unusual vaginal discharge or odor.  Return to office _________________Call for an appointment ___________________ Patients signature: ______________________ Nurses signature ________________________  Post Anesthesia Care Unit [phone] Post Anesthesia Home Care Instructions  NO IBUPROFEN PRODUCTS UNTIL 9:45 PM TONIGHT  Activity: Get plenty of rest for the remainder of the day. A responsible adult should stay with you for 24 hours following the procedure.  For the next 24 hours, DO NOT: -Drive a car -Paediatric nurse -Drink  alcoholic beverages -Take any medication unless instructed by your physician -Make any legal decisions or sign important papers.  Meals: Start with liquid foods such as gelatin or soup. Progress to regular foods as tolerated. Avoid greasy, spicy, heavy foods. If nausea and/or vomiting occur, drink only clear liquids until the nausea and/or vomiting subsides. Call your physician if vomiting continues.  Special Instructions/Symptoms: Your throat may feel dry or sore from the anesthesia or the breathing tube placed in your throat during surgery. If this causes discomfort, gargle with warm salt water. The discomfort should disappear within 24 hours.  If you had a scopolamine patch placed behind your ear for the management of post- operative nausea and/or vomiting:  1. The medication in the patch is effective for 72 hours, after which it should be removed.  Wrap patch in a tissue and discard in the trash. Wash hands thoroughly with soap and water. 2. You may remove the patch earlier than 72 hours if you experience unpleasant side effects which may include dry mouth, dizziness or visual disturbances. 3. Avoid touching the patch. Wash your hands with soap and water after contact with the patch.

## 2015-07-17 NOTE — Anesthesia Postprocedure Evaluation (Signed)
Anesthesia Post Note  Patient: Alisha Hughes  Procedure(s) Performed: Procedure(s) (LRB): LAPAROSCOPY DIAGNOSTIC (N/A) ENDOMETRIAL ABLATION WITH HTA, DILATATION AND CURRATAGE (N/A) LAPAROSCOPIC LYSIS OF ADHESIONS  Patient location during evaluation: PACU Anesthesia Type: General Level of consciousness: sedated Pain management: pain level controlled Vital Signs Assessment: post-procedure vital signs reviewed and stable Respiratory status: spontaneous breathing and respiratory function stable Cardiovascular status: stable Anesthetic complications: no    Last Vitals:  Filed Vitals:   07/17/15 1730 07/17/15 1745  BP: 127/78 129/86  Pulse: 64 65  Temp:    Resp: 16 15    Last Pain:  Filed Vitals:   07/17/15 1818  PainSc: Asleep                 Marca Gadsby DANIEL

## 2015-07-18 ENCOUNTER — Encounter (HOSPITAL_COMMUNITY): Payer: Self-pay | Admitting: Obstetrics & Gynecology

## 2015-07-26 ENCOUNTER — Telehealth: Payer: Self-pay | Admitting: *Deleted

## 2015-07-26 NOTE — Telephone Encounter (Signed)
Pt had surgery last week, has multiple small incisions that were closed with the glue.  The glue has came off of two of the incision sites and the pt is experiencing itching and a small rash.  Informed pt that can be a normal side effect from the glue and to use hydrocortisone cream and take Benadryl to help with the itching.  Pt acknowledged instructions and will call back with any further problems.

## 2015-08-31 ENCOUNTER — Encounter: Payer: 59 | Admitting: Obstetrics & Gynecology

## 2015-10-01 ENCOUNTER — Other Ambulatory Visit (INDEPENDENT_AMBULATORY_CARE_PROVIDER_SITE_OTHER): Payer: 59

## 2015-10-01 VITALS — BP 128/92 | HR 76 | Temp 98.0°F | Resp 16

## 2015-10-01 DIAGNOSIS — R350 Frequency of micturition: Secondary | ICD-10-CM

## 2015-10-01 LAB — POCT URINALYSIS DIPSTICK
Bilirubin, UA: NEGATIVE
Glucose, UA: NEGATIVE
KETONES UA: NEGATIVE
Nitrite, UA: NEGATIVE
PH UA: 6.5
SPEC GRAV UA: 1.015
UROBILINOGEN UA: 0.2

## 2015-10-01 MED ORDER — SULFAMETHOXAZOLE-TRIMETHOPRIM 800-160 MG PO TABS: 1.0000 | ORAL_TABLET | Freq: Two times a day (BID) | ORAL | Status: DC

## 2015-10-01 MED ORDER — FLAVOXATE HCL 100 MG PO TABS: 100.0000 mg | ORAL_TABLET | Freq: Three times a day (TID) | ORAL | Status: DC | PRN

## 2015-10-01 NOTE — Progress Notes (Signed)
SUBJECTIVE: Alisha Hughes is a 35 y.o. female who complains of urinary frequency, urgency, dysuria, nausea, abd pain 7/10, burning during urination, yellow vaginal discharge,and fatigue 3 days, without flank pain, fever, chills, or bleeding.   OBJECTIVE: Appears well, in no apparent distress.  The lower abdomen is positive for tender to touch. Urine dipstick shows positive for leukocytes and moderate blood.  Micro exam: not done.   Filed Vitals:   10/01/15 0829  BP: 128/92  Pulse: 76  Temp: 98 F (36.7 C)  TempSrc: Oral  Resp: 16    ASSESSMENT: Possible UTI   PLAN:  Will send urine for culture. Treatment per protocol - also push fluids. Call or return to clinic prn if these symptoms worsen or fail to improve as anticipated.

## 2015-10-03 ENCOUNTER — Telehealth: Payer: Self-pay | Admitting: *Deleted

## 2015-10-03 DIAGNOSIS — N390 Urinary tract infection, site not specified: Secondary | ICD-10-CM

## 2015-10-03 MED ORDER — LEVOFLOXACIN 750 MG PO TABS: 750.0000 mg | ORAL_TABLET | Freq: Every day | ORAL | Status: DC

## 2015-10-03 NOTE — Telephone Encounter (Signed)
Pt called in stating she has had no relief. Spoke to Dr Harolyn Rutherford who verbal auth Levofloxacin qd. Advised pt new abx at pharmacy. Pt expressed understanding.

## 2015-10-05 LAB — CULTURE, URINE COMPREHENSIVE: Colony Count: >=100000

## 2016-02-11 ENCOUNTER — Encounter: Payer: Self-pay | Admitting: Family Medicine

## 2016-02-11 ENCOUNTER — Encounter (INDEPENDENT_AMBULATORY_CARE_PROVIDER_SITE_OTHER): Payer: Self-pay

## 2016-02-11 ENCOUNTER — Ambulatory Visit (INDEPENDENT_AMBULATORY_CARE_PROVIDER_SITE_OTHER): Payer: 59 | Admitting: Family Medicine

## 2016-02-11 VITALS — BP 142/94 | HR 87 | Temp 98.0°F | Ht 63.0 in | Wt 161.5 lb

## 2016-02-11 DIAGNOSIS — I10 Essential (primary) hypertension: Secondary | ICD-10-CM | POA: Diagnosis not present

## 2016-02-11 DIAGNOSIS — G43709 Chronic migraine without aura, not intractable, without status migrainosus: Secondary | ICD-10-CM

## 2016-02-11 DIAGNOSIS — F419 Anxiety disorder, unspecified: Secondary | ICD-10-CM

## 2016-02-11 DIAGNOSIS — F418 Other specified anxiety disorders: Secondary | ICD-10-CM

## 2016-02-11 DIAGNOSIS — Z23 Encounter for immunization: Secondary | ICD-10-CM | POA: Diagnosis not present

## 2016-02-11 DIAGNOSIS — F329 Major depressive disorder, single episode, unspecified: Secondary | ICD-10-CM

## 2016-02-11 DIAGNOSIS — F32A Depression, unspecified: Secondary | ICD-10-CM | POA: Insufficient documentation

## 2016-02-11 MED ORDER — TOPIRAMATE 100 MG PO TABS: 100.0000 mg | ORAL_TABLET | Freq: Every day | ORAL | 3 refills | Status: DC

## 2016-02-11 MED ORDER — SUMATRIPTAN SUCCINATE 100 MG PO TABS
100.0000 mg | ORAL_TABLET | ORAL | 2 refills | Status: DC | PRN
Start: 2016-02-11 — End: 2016-07-09

## 2016-02-11 MED ORDER — AMLODIPINE BESYLATE 5 MG PO TABS: 5.0000 mg | ORAL_TABLET | Freq: Every day | ORAL | 3 refills | Status: DC

## 2016-02-11 MED ORDER — BUPROPION HCL ER (XL) 150 MG PO TB24: 150.0000 mg | ORAL_TABLET | Freq: Every day | ORAL | 3 refills | Status: DC

## 2016-02-11 NOTE — Progress Notes (Signed)
Pre visit review using our clinic review tool, if applicable. No additional management support is needed unless otherwise documented below in the visit note. 

## 2016-02-11 NOTE — Assessment & Plan Note (Signed)
New diagnosis/new problem. Starting patient on Norvasc (may aid migraine).

## 2016-02-11 NOTE — Patient Instructions (Signed)
Take the medications as prescribed.   Follow up early next year.  Take care  Dr. Lacinda Axon   Health Maintenance, Female Adopting a healthy lifestyle and getting preventive care can go a long way to promote health and wellness. Talk with your health care provider about what schedule of regular examinations is right for you. This is a good chance for you to check in with your provider about disease prevention and staying healthy. In between checkups, there are plenty of things you can do on your own. Experts have done a lot of research about which lifestyle changes and preventive measures are most likely to keep you healthy. Ask your health care provider for more information. WEIGHT AND DIET  Eat a healthy diet  Be sure to include plenty of vegetables, fruits, low-fat dairy products, and lean protein.  Do not eat a lot of foods high in solid fats, added sugars, or salt.  Get regular exercise. This is one of the most important things you can do for your health.  Most adults should exercise for at least 150 minutes each week. The exercise should increase your heart rate and make you sweat (moderate-intensity exercise).  Most adults should also do strengthening exercises at least twice a week. This is in addition to the moderate-intensity exercise.  Maintain a healthy weight  Body mass index (BMI) is a measurement that can be used to identify possible weight problems. It estimates body fat based on height and weight. Your health care provider can help determine your BMI and help you achieve or maintain a healthy weight.  For females 41 years of age and older:   A BMI below 18.5 is considered underweight.  A BMI of 18.5 to 24.9 is normal.  A BMI of 25 to 29.9 is considered overweight.  A BMI of 30 and above is considered obese.  Watch levels of cholesterol and blood lipids  You should start having your blood tested for lipids and cholesterol at 34 years of age, then have this test every  5 years.  You may need to have your cholesterol levels checked more often if:  Your lipid or cholesterol levels are high.  You are older than 34 years of age.  You are at high risk for heart disease.  CANCER SCREENING   Lung Cancer  Lung cancer screening is recommended for adults 65-76 years old who are at high risk for lung cancer because of a history of smoking.  A yearly low-dose CT scan of the lungs is recommended for people who:  Currently smoke.  Have quit within the past 15 years.  Have at least a 30-pack-year history of smoking. A pack year is smoking an average of one pack of cigarettes a day for 1 year.  Yearly screening should continue until it has been 15 years since you quit.  Yearly screening should stop if you develop a health problem that would prevent you from having lung cancer treatment.  Breast Cancer  Practice breast self-awareness. This means understanding how your breasts normally appear and feel.  It also means doing regular breast self-exams. Let your health care provider know about any changes, no matter how small.  If you are in your 20s or 30s, you should have a clinical breast exam (CBE) by a health care provider every 1-3 years as part of a regular health exam.  If you are 39 or older, have a CBE every year. Also consider having a breast X-ray (mammogram) every year.  If  you have a family history of breast cancer, talk to your health care provider about genetic screening.  If you are at high risk for breast cancer, talk to your health care provider about having an MRI and a mammogram every year.  Breast cancer gene (BRCA) assessment is recommended for women who have family members with BRCA-related cancers. BRCA-related cancers include:  Breast.  Ovarian.  Tubal.  Peritoneal cancers.  Results of the assessment will determine the need for genetic counseling and BRCA1 and BRCA2 testing. Cervical Cancer Your health care provider may  recommend that you be screened regularly for cancer of the pelvic organs (ovaries, uterus, and vagina). This screening involves a pelvic examination, including checking for microscopic changes to the surface of your cervix (Pap test). You may be encouraged to have this screening done every 3 years, beginning at age 47.  For women ages 80-65, health care providers may recommend pelvic exams and Pap testing every 3 years, or they may recommend the Pap and pelvic exam, combined with testing for human papilloma virus (HPV), every 5 years. Some types of HPV increase your risk of cervical cancer. Testing for HPV may also be done on women of any age with unclear Pap test results.  Other health care providers may not recommend any screening for nonpregnant women who are considered low risk for pelvic cancer and who do not have symptoms. Ask your health care provider if a screening pelvic exam is right for you.  If you have had past treatment for cervical cancer or a condition that could lead to cancer, you need Pap tests and screening for cancer for at least 20 years after your treatment. If Pap tests have been discontinued, your risk factors (such as having a new sexual partner) need to be reassessed to determine if screening should resume. Some women have medical problems that increase the chance of getting cervical cancer. In these cases, your health care provider may recommend more frequent screening and Pap tests. Colorectal Cancer  This type of cancer can be detected and often prevented.  Routine colorectal cancer screening usually begins at 34 years of age and continues through 34 years of age.  Your health care provider may recommend screening at an earlier age if you have risk factors for colon cancer.  Your health care provider may also recommend using home test kits to check for hidden blood in the stool.  A small camera at the end of a tube can be used to examine your colon directly  (sigmoidoscopy or colonoscopy). This is done to check for the earliest forms of colorectal cancer.  Routine screening usually begins at age 57.  Direct examination of the colon should be repeated every 5-10 years through 34 years of age. However, you may need to be screened more often if early forms of precancerous polyps or small growths are found. Skin Cancer  Check your skin from head to toe regularly.  Tell your health care provider about any new moles or changes in moles, especially if there is a change in a mole's shape or color.  Also tell your health care provider if you have a mole that is larger than the size of a pencil eraser.  Always use sunscreen. Apply sunscreen liberally and repeatedly throughout the day.  Protect yourself by wearing long sleeves, pants, a wide-brimmed hat, and sunglasses whenever you are outside. HEART DISEASE, DIABETES, AND HIGH BLOOD PRESSURE   High blood pressure causes heart disease and increases the risk of  stroke. High blood pressure is more likely to develop in:  People who have blood pressure in the high end of the normal range (130-139/85-89 mm Hg).  People who are overweight or obese.  People who are African American.  If you are 9-32 years of age, have your blood pressure checked every 3-5 years. If you are 6 years of age or older, have your blood pressure checked every year. You should have your blood pressure measured twice--once when you are at a hospital or clinic, and once when you are not at a hospital or clinic. Record the average of the two measurements. To check your blood pressure when you are not at a hospital or clinic, you can use:  An automated blood pressure machine at a pharmacy.  A home blood pressure monitor.  If you are between 44 years and 43 years old, ask your health care provider if you should take aspirin to prevent strokes.  Have regular diabetes screenings. This involves taking a blood sample to check your  fasting blood sugar level.  If you are at a normal weight and have a low risk for diabetes, have this test once every three years after 34 years of age.  If you are overweight and have a high risk for diabetes, consider being tested at a younger age or more often. PREVENTING INFECTION  Hepatitis B  If you have a higher risk for hepatitis B, you should be screened for this virus. You are considered at high risk for hepatitis B if:  You were born in a country where hepatitis B is common. Ask your health care provider which countries are considered high risk.  Your parents were born in a high-risk country, and you have not been immunized against hepatitis B (hepatitis B vaccine).  You have HIV or AIDS.  You use needles to inject street drugs.  You live with someone who has hepatitis B.  You have had sex with someone who has hepatitis B.  You get hemodialysis treatment.  You take certain medicines for conditions, including cancer, organ transplantation, and autoimmune conditions. Hepatitis C  Blood testing is recommended for:  Everyone born from [birth year] through 1964.  Anyone with known risk factors for hepatitis C. Sexually transmitted infections (STIs)  You should be screened for sexually transmitted infections (STIs) including gonorrhea and chlamydia if:  You are sexually active and are younger than 34 years of age.  You are older than 34 years of age and your health care provider tells you that you are at risk for this type of infection.  Your sexual activity has changed since you were last screened and you are at an increased risk for chlamydia or gonorrhea. Ask your health care provider if you are at risk.  If you do not have HIV, but are at risk, it may be recommended that you take a prescription medicine daily to prevent HIV infection. This is called pre-exposure prophylaxis (PrEP). You are considered at risk if:  You are sexually active and do not regularly use condoms or  know the HIV status of your partner(s).  You take drugs by injection.  You are sexually active with a partner who has HIV. Talk with your health care provider about whether you are at high risk of being infected with HIV. If you choose to begin PrEP, you should first be tested for HIV. You should then be tested every 3 months for as long as you are taking PrEP.  PREGNANCY   If  you are premenopausal and you may become pregnant, ask your health care provider about preconception counseling.  If you may become pregnant, take 400 to 800 micrograms (mcg) of folic acid every day.  If you want to prevent pregnancy, talk to your health care provider about birth control (contraception). OSTEOPOROSIS AND MENOPAUSE   Osteoporosis is a disease in which the bones lose minerals and strength with aging. This can result in serious bone fractures. Your risk for osteoporosis can be identified using a bone density scan.  If you are 83 years of age or older, or if you are at risk for osteoporosis and fractures, ask your health care provider if you should be screened.  Ask your health care provider whether you should take a calcium or vitamin D supplement to lower your risk for osteoporosis.  Menopause may have certain physical symptoms and risks.  Hormone replacement therapy may reduce some of these symptoms and risks. Talk to your health care provider about whether hormone replacement therapy is right for you.  HOME CARE INSTRUCTIONS   Schedule regular health, dental, and eye exams.  Stay current with your immunizations.   Do not use any tobacco products including cigarettes, chewing tobacco, or electronic cigarettes.  If you are pregnant, do not drink alcohol.  If you are breastfeeding, limit how much and how often you drink alcohol.  Limit alcohol intake to no more than 1 drink per day for nonpregnant women. One drink equals 12 ounces of beer, 5 ounces of wine, or 1 ounces of hard liquor.  Do  not use street drugs.  Do not share needles.  Ask your health care provider for help if you need support or information about quitting drugs.  Tell your health care provider if you often feel depressed.  Tell your health care provider if you have ever been abused or do not feel safe at home.   This information is not intended to replace advice given to you by your health care provider. Make sure you discuss any questions you have with your health care provider.   Document Released: 10/14/2010 Document Revised: 04/21/2014 Document Reviewed: 03/02/2013 Elsevier Interactive Patient Education [employer].

## 2016-02-11 NOTE — Progress Notes (Signed)
Subjective:  Patient ID: Alisha Hughes, female    DOB: 10-03-1980  Age: 35 y.o. MRN: [id number]  CC: Elevated BP, Migraine, Depression/anxiety  HPI Alisha Hughes is a 35 y.o. female presents to the clinic today as a new patient with the above complaints.   Elevated BP  Prior history of pre-eclampsia x 2.  BP's have been elevated consistently.  Checking at home and BP's elevated.  + early family history of HTN.  No meds currently.  Would like to discus this today.   Migraine  Currently uncontrolled as she has been out of medications.  Has previously done well on prophylactic Topamax and Imitrex as needed.  She has recently been having more migraines she's been out of medication.  She would like refill today.  Depression/anxiety  Uncontrolled.  Patient currently struggling with depression and anxiety.  She was previously on Zoloft but is no longer on this medication.  She feels that Zoloft contributed to weight gain.  She would like to discuss treatment options today.  PMH, Surgical Hx, Family Hx, Social History reviewed and updated as below.  Past Medical History:  Diagnosis Date  . Abnormal Pap smear   . Anxiety and depression   . Hyperlipidemia   . Kidney stones   . Migraine   . Preeclampsia    Past Surgical History:  Procedure Laterality Date  . CESAREAN SECTION    . CESAREAN SECTION N/A 12/19/2012   Procedure: Repeat cesarean section with delivery of baby girl at [place].    Bilateral tubal ligation with filshie clips.;  Surgeon: Donnamae Jude, MD;  Location: [facility];  Service: Obstetrics;  Laterality: N/A;  . COLONOSCOPY     2009  . COLPOSCOPY VULVA W/ BIOPSY     abnormal pap  . ENDOMETRIAL ABLATION N/A 07/17/2015   Procedure: ENDOMETRIAL ABLATION WITH HTA, DILATATION AND CURRATAGE;  Surgeon: Emily Filbert, MD;  Location: [facility];  Service: Gynecology;  Laterality: N/A;  . KNEE ARTHROSCOPY    . LAPAROSCOPIC LYSIS OF ADHESIONS  07/17/2015   Procedure:  LAPAROSCOPIC LYSIS OF ADHESIONS;  Surgeon: Emily Filbert, MD;  Location: [facility];  Service: Gynecology;;  . LAPAROSCOPY N/A 07/17/2015   Procedure: LAPAROSCOPY DIAGNOSTIC;  Surgeon: Emily Filbert, MD;  Location: [facility];  Service: Gynecology;  Laterality: N/A;  . TUBAL LIGATION    . UPPER GI ENDOSCOPY    . WISDOM TOOTH EXTRACTION     Family History  Problem Relation Age of Onset  . Hypertension Mother   . Anuerysm Mother     brain  . Migraines Mother   . Heart disease Father   . Hypertension Father   . Hyperlipidemia Father   . Prostate cancer Father   . Depression Brother   . Multiple sclerosis Maternal Grandmother   . Sudden death Maternal Grandmother   . Heart disease Maternal Grandfather   . Sudden death Maternal Grandfather   . Heart disease Paternal Grandmother   . Diabetes Paternal Grandmother   . Parkinson's disease Paternal Grandfather    Social History  Substance Use Topics  . Smoking status: Never Smoker  . Smokeless tobacco: Never Used  . Alcohol use 0.0 oz/week     Comment: rarely    Review of Systems  Cardiovascular: Positive for chest pain.  Neurological: Positive for headaches.  Psychiatric/Behavioral:       Sadness, anxiety, stress.  All other systems reviewed and are negative.   Objective:   Today's Vitals: BP Marland Kitchen)  142/94 (BP Location: Left Arm, Patient Position: Sitting, Cuff Size: Normal)   Pulse 87   Temp 98 F (36.7 C) (Oral)   Ht 5\' 3"  (1.6 m)   Wt 161 lb 8 oz (73.3 kg)   SpO2 98%   BMI 28.61 kg/m   Physical Exam  Constitutional: She is oriented to person, place, and time. She appears well-developed and well-nourished. No distress.  HENT:  Head: Normocephalic and atraumatic.  Nose: Nose normal.  Mouth/Throat: Oropharynx is clear and moist. No oropharyngeal exudate.  Normal TM's bilaterally.   Eyes: Conjunctivae are normal. No scleral icterus.  Neck: Neck supple. No thyromegaly present.  Cardiovascular: Normal rate and regular rhythm.   No  murmur heard. Pulmonary/Chest: Effort normal and breath sounds normal. She has no wheezes. She has no rales.  Abdominal: Soft. She exhibits no distension. There is no tenderness. There is no rebound and no guarding.  Musculoskeletal: Normal range of motion. She exhibits no edema.  Lymphadenopathy:    She has no cervical adenopathy.  Neurological: She is alert and oriented to person, place, and time.  Skin: Skin is warm and dry. No rash noted.  Psychiatric: She has a normal mood and affect.  Vitals reviewed.  Assessment & Plan:   Problem List Items Addressed This Visit    Essential hypertension - Primary    New diagnosis/new problem. Starting patient on Norvasc (may aid migraine).      Relevant Medications   amLODipine (NORVASC) 5 MG tablet   Chronic migraine without aura    New problem (to me). Placing patient back on Topamax and Imitrex.      Relevant Medications   SUMAtriptan (IMITREX) 100 MG tablet   topiramate (TOPAMAX) 100 MG tablet   Anxiety and depression    Uncontrolled. Starting patient on Wellbutrin.      Relevant Medications   buPROPion (WELLBUTRIN XL) 150 MG 24 hr tablet    Other Visit Diagnoses    Encounter for immunization       Relevant Orders   Flu Vaccine QUAD 36+ mos IM (Completed)      Outpatient Encounter Prescriptions as of 02/11/2016  Medication Sig  . SUMAtriptan (IMITREX) 100 MG tablet Take 1 tablet (100 mg total) by mouth every 2 (two) hours as needed for migraine. May repeat in 2 hours if headache persists or recurs.  . topiramate (TOPAMAX) 100 MG tablet Take 1 tablet (100 mg total) by mouth daily.  . [DISCONTINUED] SUMAtriptan (IMITREX) 100 MG tablet Take 100 mg by mouth every 2 (two) hours as needed for migraine. May repeat in 2 hours if headache persists or recurs.  . [DISCONTINUED] topiramate (TOPAMAX) 100 MG tablet Take 100 mg by mouth daily.  Marland Kitchen amLODipine (NORVASC) 5 MG tablet Take 1 tablet (5 mg total) by mouth daily.  Marland Kitchen buPROPion  (WELLBUTRIN XL) 150 MG 24 hr tablet Take 1 tablet (150 mg total) by mouth daily.  . [DISCONTINUED] CLONAZEPAM PO Take 1 tablet by mouth as needed (anxiety). Pt unsure of strength  . [DISCONTINUED] flavoxATE (URISPAS) 100 MG tablet Take 1 tablet (100 mg total) by mouth 3 (three) times daily as needed for bladder spasms (bladder spasm).  . [DISCONTINUED] ibuprofen (ADVIL,MOTRIN) 600 MG tablet Take 600 mg by mouth every 6 (six) hours as needed.  . [DISCONTINUED] levofloxacin (LEVAQUIN) 750 MG tablet Take 1 tablet (750 mg total) by mouth daily.  . [DISCONTINUED] oxyCODONE-acetaminophen (PERCOCET/ROXICET) 5-325 MG tablet Take 1-2 tablets by mouth every 6 (six) hours as needed.  . [  DISCONTINUED] sertraline (ZOLOFT) 100 MG tablet Take 100 mg by mouth daily.  . [DISCONTINUED] sulfamethoxazole-trimethoprim (BACTRIM DS,SEPTRA DS) 800-160 MG tablet Take 1 tablet by mouth 2 (two) times daily.  . [DISCONTINUED] traMADol (ULTRAM) 50 MG tablet Take 1 tablet (50 mg total) by mouth every 6 (six) hours as needed. (Patient not taking: Reported on 07/03/2015)   No facility-administered encounter medications on file as of 02/11/2016.     Follow-up: 3 months (checking BP's at home)  [place]

## 2016-02-11 NOTE — Assessment & Plan Note (Signed)
Uncontrolled. Starting patient on Wellbutrin.

## 2016-02-11 NOTE — Assessment & Plan Note (Signed)
New problem (to me). Placing patient back on Topamax and Imitrex.

## 2016-02-19 ENCOUNTER — Ambulatory Visit (INDEPENDENT_AMBULATORY_CARE_PROVIDER_SITE_OTHER): Payer: 59 | Admitting: Physician Assistant

## 2016-02-19 ENCOUNTER — Encounter: Payer: Self-pay | Admitting: Physician Assistant

## 2016-02-19 VITALS — BP 121/89 | HR 85 | Resp 18 | Ht 63.0 in | Wt 158.0 lb

## 2016-02-19 DIAGNOSIS — F329 Major depressive disorder, single episode, unspecified: Secondary | ICD-10-CM

## 2016-02-19 DIAGNOSIS — F419 Anxiety disorder, unspecified: Secondary | ICD-10-CM

## 2016-02-19 DIAGNOSIS — M62838 Other muscle spasm: Secondary | ICD-10-CM

## 2016-02-19 DIAGNOSIS — F418 Other specified anxiety disorders: Secondary | ICD-10-CM

## 2016-02-19 DIAGNOSIS — F32A Depression, unspecified: Secondary | ICD-10-CM

## 2016-02-19 DIAGNOSIS — G43709 Chronic migraine without aura, not intractable, without status migrainosus: Secondary | ICD-10-CM

## 2016-02-19 MED ORDER — TOPIRAMATE 200 MG PO TABS: 200.0000 mg | ORAL_TABLET | Freq: Every day | ORAL | 3 refills | Status: DC

## 2016-02-19 MED ORDER — SUMATRIPTAN 20 MG/ACT NA SOLN: 20.0000 mg | NASAL | 2 refills | Status: DC | PRN

## 2016-02-19 NOTE — Progress Notes (Signed)
History:  Alisha Hughes is a 35 y.o. G2P1101 who presents to clinic today for eval of headaches.  She was diagnosed with chronic migraines years ago.  She has seen her PCP for this.  Has been on Topamax.  No improvement.    Worried about hormones - having much worse acne x 2 months, worse mood swings and HA.  Has tried oils, alternative medicines/evening primrose.  Started Topamaxo - taking 100mg  daily in the morning for the last 2 months.  Started Norvasc last week for HTN and Wellbutrin for anxiety and to increase libido.   Headaches are moderate to severe.  Located base of skull, more often left sided.  She sometimes wakes with it across her forehead into temples.  These having throbbing.  No issue with lights/noises.  No nausea/vomiting.  Moving makes it worse.  Has floaters - not sure if related to HA.  No known aura. Imitrex is helpful but she does not have enough to last the entire month.  Sometimes has to be repeated.  Always has to lay down after taking. Ibuprofen, naproxen, excedrin, fioricet, all tried and no longer helpful.  Tramadol sometimes helpful.   Has had cocktail at [facility]: toradol + 2 others: helped for 3 hours.   Has had brain scans after her mom was diagnosed with aneurysm.  No issue noted for pt.     HIT6:60 Number of days in the last 4 weeks with:  Severe headache: 6 Moderate headache: 10 Mild headache: 8  No headache: 4   Past Medical History:  Diagnosis Date  . Abnormal Pap smear   . Anxiety and depression   . Hyperlipidemia   . Kidney stones   . Migraine   . Preeclampsia     Social History   Social History  . Marital status: Married    Spouse name: N/A  . Number of children: N/A  . Years of education: N/A   Occupational History  . Not on file.   Social History Main Topics  . Smoking status: Never Smoker  . Smokeless tobacco: Never Used  . Alcohol use 0.0 oz/week     Comment: rarely  . Drug use: No  . Sexual activity: Yes    Partners: Male     Birth control/ protection: Surgical     Comment: BTL   Other Topics Concern  . Not on file   Social History Narrative  . No narrative on file    Family History  Problem Relation Age of Onset  . Hypertension Mother   . Anuerysm Mother     brain  . Migraines Mother   . Heart disease Father   . Hypertension Father   . Hyperlipidemia Father   . Prostate cancer Father   . Depression Brother   . Multiple sclerosis Maternal Grandmother   . Sudden death Maternal Grandmother   . Heart disease Maternal Grandfather   . Sudden death Maternal Grandfather   . Heart disease Paternal Grandmother   . Diabetes Paternal Grandmother   . Parkinson's disease Paternal Grandfather     Allergies  Allergen Reactions  . Latex Rash  . Reglan [Metoclopramide] Nausea Only    Current Outpatient Prescriptions on File Prior to Visit  Medication Sig Dispense Refill  . amLODipine (NORVASC) 5 MG tablet Take 1 tablet (5 mg total) by mouth daily. 90 tablet 3  . buPROPion (WELLBUTRIN XL) 150 MG 24 hr tablet Take 1 tablet (150 mg total) by mouth daily. 90 tablet 3  .  SUMAtriptan (IMITREX) 100 MG tablet Take 1 tablet (100 mg total) by mouth every 2 (two) hours as needed for migraine. May repeat in 2 hours if headache persists or recurs. 10 tablet 2  . topiramate (TOPAMAX) 100 MG tablet Take 1 tablet (100 mg total) by mouth daily. 90 tablet 3   No current facility-administered medications on file prior to visit.      Review of Systems:  All pertinent positive/negative included in HPI, all other review of systems are negative   Objective:  Physical Exam BP 121/89 (BP Location: Left Arm, Patient Position: Sitting, Cuff Size: Normal)   Pulse 85   Resp 18   Ht 5\' 3"  (1.6 m)   Wt 158 lb (71.7 kg)   LMP 01/21/2016   BMI 27.99 kg/m  CONSTITUTIONAL: Well-developed, well-nourished female in no acute distress.  EYES: EOM intact ENT: Normocephalic CARDIOVASCULAR: Regular rate and rhythm with no  adventitious sounds.  RESPIRATORY: Normal rate. Clear to auscultation bilaterally.   MUSCULOSKELETAL: Normal ROM, strength equal bilaterally,  muscle spasm noted SKIN: Warm, dry without erythema  NEUROLOGICAL: Alert, oriented, CN II-XII grossly intact, Appropriate balance PSYCH: Normal behavior, mood   Assessment & Plan:  Assessment: 1. Chronic migraine without aura without status migrainosus, not intractable   2. Anxiety and depression   3. Muscle spasm      Plan: Increase Topamax to 200mg  for migraine prevention May trial Imitrex NS as sometimes pill is not enough.  Limit imitrex use to 2 doses/24 hours and no more than 2 days per week.  Add ibuprofen or naprosyn with imitrex tab for improved efficacy Continue good self care.  Add more regular exercise Pt interested in participating in studies.  [employer] will be notified.   Follow-up in 2-3 months or sooner PRN  Consider adding muscle relaxant   Paticia Stack, PA-C 02/19/2016 8:43 AM

## 2016-02-19 NOTE — Patient Instructions (Signed)

## 2016-04-15 DIAGNOSIS — J0100 Acute maxillary sinusitis, unspecified: Secondary | ICD-10-CM | POA: Diagnosis not present

## 2016-04-30 ENCOUNTER — Ambulatory Visit: Payer: 59 | Admitting: Family Medicine

## 2016-05-09 DIAGNOSIS — M9901 Segmental and somatic dysfunction of cervical region: Secondary | ICD-10-CM | POA: Diagnosis not present

## 2016-05-09 DIAGNOSIS — M62830 Muscle spasm of back: Secondary | ICD-10-CM | POA: Diagnosis not present

## 2016-05-12 DIAGNOSIS — Z8249 Family history of ischemic heart disease and other diseases of the circulatory system: Secondary | ICD-10-CM | POA: Diagnosis not present

## 2016-05-12 DIAGNOSIS — R51 Headache: Secondary | ICD-10-CM | POA: Diagnosis not present

## 2016-05-13 ENCOUNTER — Telehealth: Payer: Self-pay | Admitting: *Deleted

## 2016-05-13 DIAGNOSIS — I10 Essential (primary) hypertension: Secondary | ICD-10-CM

## 2016-05-13 MED ORDER — AMLODIPINE BESYLATE 5 MG PO TABS: 5.0000 mg | ORAL_TABLET | Freq: Every day | ORAL | 1 refills | Status: DC

## 2016-05-13 NOTE — Telephone Encounter (Signed)
Pt requested a medication refill for amlodipine and amlodipine  Pharmacy [facility]

## 2016-05-13 NOTE — Telephone Encounter (Signed)
Refill sent.

## 2016-05-14 ENCOUNTER — Ambulatory Visit: Payer: 59 | Admitting: Family Medicine

## 2016-05-16 ENCOUNTER — Encounter: Payer: 59 | Admitting: Physician Assistant

## 2016-05-20 DIAGNOSIS — M5481 Occipital neuralgia: Secondary | ICD-10-CM | POA: Diagnosis not present

## 2016-05-21 DIAGNOSIS — M5481 Occipital neuralgia: Secondary | ICD-10-CM | POA: Diagnosis not present

## 2016-05-28 ENCOUNTER — Encounter: Payer: Self-pay | Admitting: Physician Assistant

## 2016-05-28 ENCOUNTER — Ambulatory Visit (INDEPENDENT_AMBULATORY_CARE_PROVIDER_SITE_OTHER): Payer: 59 | Admitting: Physician Assistant

## 2016-05-28 VITALS — BP 130/90 | HR 94 | Temp 98.2°F | Ht 63.0 in | Wt 148.0 lb

## 2016-05-28 DIAGNOSIS — J101 Influenza due to other identified influenza virus with other respiratory manifestations: Secondary | ICD-10-CM

## 2016-05-28 DIAGNOSIS — J029 Acute pharyngitis, unspecified: Secondary | ICD-10-CM | POA: Diagnosis not present

## 2016-05-28 DIAGNOSIS — R52 Pain, unspecified: Secondary | ICD-10-CM | POA: Diagnosis not present

## 2016-05-28 MED ORDER — OSELTAMIVIR PHOSPHATE 75 MG PO CAPS: 75.0000 mg | ORAL_CAPSULE | Freq: Two times a day (BID) | ORAL | 0 refills | Status: DC

## 2016-05-28 MED ORDER — ALBUTEROL SULFATE HFA 108 (90 BASE) MCG/ACT IN AERS: 2.0000 | INHALATION_SPRAY | Freq: Four times a day (QID) | RESPIRATORY_TRACT | 0 refills | Status: DC | PRN

## 2016-05-28 NOTE — Progress Notes (Signed)
Pre visit review using our clinic review tool, if applicable. No additional management support is needed unless otherwise documented below in the visit note. 

## 2016-05-28 NOTE — Patient Instructions (Signed)
It was great meeting you!  I have sent in Tamiflu for you to take, as well as an albuterol inhaler to help open up your lungs and help with chest tightness.  May use plain mucinex with plenty of fluid to help mobilize mucous. Please let us know if fever >101.5, trouble opening/closing mouth, difficulty swallowing, worsening shortness of breath, or worsening instead of improving as expected.  Influenza, Adult Influenza, more commonly known as "the flu," is a viral infection that primarily affects the respiratory tract. The respiratory tract includes organs that help you breathe, such as the lungs, nose, and throat. The flu causes many common cold symptoms, as well as a high fever and body aches. The flu spreads easily from person to person (is contagious). Getting a flu shot (influenza vaccination) every year is the best way to prevent influenza. What are the causes? Influenza is caused by a virus. You can catch the virus by:  Breathing in droplets from an infected person's cough or sneeze.  Touching something that was recently contaminated with the virus and then touching your mouth, nose, or eyes. What increases the risk? The following factors may make you more likely to get the flu:  Not cleaning your hands frequently with soap and water or alcohol-based hand sanitizer.  Having close contact with many people during cold and flu season.  Touching your mouth, eyes, or nose without washing or sanitizing your hands first.  Not drinking enough fluids or not eating a healthy diet.  Not getting enough sleep or exercise.  Being under a high amount of stress.  Not getting a yearly (annual) flu shot. You may be at a higher risk of complications from the flu, such as a severe lung infection (pneumonia), if you:  Are over the age of 35.  Are pregnant.  Have a weakened disease-fighting system (immune system). You may have a weakened immune system if you:  Have HIV or AIDS.  Are  undergoing chemotherapy.  Aretaking medicines that reduce the activity of (suppress) the immune system.  Have a long-term (chronic) illness, such as heart disease, kidney disease, diabetes, or lung disease.  Have a liver disorder.  Are obese.  Have anemia. What are the signs or symptoms? Symptoms of this condition typically last 4-10 days and may include:  Fever.  Chills.  Headache, body aches, or muscle aches.  Sore throat.  Cough.  Runny or congested nose.  Chest discomfort and cough.  Poor appetite.  Weakness or tiredness (fatigue).  Dizziness.  Nausea or vomiting. How is this diagnosed? This condition may be diagnosed based on your medical history and a physical exam. Your health care provider may do a nose or throat swab test to confirm the diagnosis. How is this treated? If influenza is detected early, you can be treated with antiviral medicine that can reduce the length of your illness and the severity of your symptoms. This medicine may be given by mouth (orally) or through an IV tube that is inserted in one of your veins. The goal of treatment is to relieve symptoms by taking care of yourself at home. This may include taking over-the-counter medicines, drinking plenty of fluids, and adding humidity to the air in your home. In some cases, influenza goes away on its own. Severe influenza or complications from influenza may be treated in a hospital. Follow these instructions at home:  Take over-the-counter and prescription medicines only as told by your health care provider.  Use a cool mist humidifier to  add humidity to the air in your home. This can make breathing easier.  Rest as needed.  Drink enough fluid to keep your urine clear or pale yellow.  Cover your mouth and nose when you cough or sneeze.  Wash your hands with soap and water often, especially after you cough or sneeze. If soap and water are not available, use hand sanitizer.  Stay home from  work or school as told by your health care provider. Unless you are visiting your health care provider, try to avoid leaving home until your fever has been gone for 24 hours without the use of medicine.  Keep all follow-up visits as told by your health care provider. This is important. How is this prevented?  Getting an annual flu shot is the best way to avoid getting the flu. You may get the flu shot in late summer, fall, or winter. Ask your health care provider when you should get your flu shot.  Wash your hands often or use hand sanitizer often.  Avoid contact with people who are sick during cold and flu season.  Eat a healthy diet, drink plenty of fluids, get enough sleep, and exercise regularly. Contact a health care provider if:  You develop new symptoms.  You have:  Chest pain.  Diarrhea.  A fever.  Your cough gets worse.  You produce more mucus.  You feel nauseous or you vomit. Get help right away if:  You develop shortness of breath or difficulty breathing.  Your skin or nails turn a bluish color.  You have severe pain or stiffness in your neck.  You develop a sudden headache or sudden pain in your face or ear.  You cannot stop vomiting. This information is not intended to replace advice given to you by your health care provider. Make sure you discuss any questions you have with your health care provider. Document Released: 03/28/2000 Document Revised: 09/06/2015 Document Reviewed: 01/23/2015 Elsevier Interactive Patient Education  2017 [employer].

## 2016-05-28 NOTE — Progress Notes (Signed)
Subjective:    Patient ID: Alisha Hughes, female    DOB: 1980-07-31, 36 y.o.   MRN: [id number]  HPI  [name] is a 36 y/o female who presents with chief complaint of "flu-like symptoms" including chest congestion, nausea, diarrhea, sore throat, body aches, fever up to 100 degrees. Her symptoms started yesterday but significantly worsened today. She has two young children that she took to get flu shots last week and she wonders if she was exposed to something while there. She denies chest pain, vomiting, swelling of lower legs. She has not taken anything for her symptoms.    Review of Systems  See HPI  Past Medical History:  Diagnosis Date  . Abnormal Pap smear   . Anxiety and depression   . Hyperlipidemia   . Kidney stones   . Migraine   . Preeclampsia      Social History   Social History  . Marital status: Married    Spouse name: N/A  . Number of children: N/A  . Years of education: N/A   Occupational History  . Not on file.   Social History Main Topics  . Smoking status: Never Smoker  . Smokeless tobacco: Never Used  . Alcohol use 0.0 oz/week     Comment: rarely  . Drug use: No  . Sexual activity: Yes    Partners: Male    Birth control/ protection: Surgical     Comment: BTL   Other Topics Concern  . Not on file   Social History Narrative  . No narrative on file    Past Surgical History:  Procedure Laterality Date  . CESAREAN SECTION    . CESAREAN SECTION N/A 12/19/2012   Procedure: Repeat cesarean section with delivery of baby girl at [place].    Bilateral tubal ligation with filshie clips.;  Surgeon: Donnamae Jude, MD;  Location: [facility];  Service: Obstetrics;  Laterality: N/A;  . COLONOSCOPY     2009  . COLPOSCOPY VULVA W/ BIOPSY     abnormal pap  . ENDOMETRIAL ABLATION N/A 07/17/2015   Procedure: ENDOMETRIAL ABLATION WITH HTA, DILATATION AND CURRATAGE;  Surgeon: Emily Filbert, MD;  Location: [facility];  Service: Gynecology;  Laterality: N/A;  . KNEE  ARTHROSCOPY    . LAPAROSCOPIC LYSIS OF ADHESIONS  07/17/2015   Procedure: LAPAROSCOPIC LYSIS OF ADHESIONS;  Surgeon: Emily Filbert, MD;  Location: [facility];  Service: Gynecology;;  . LAPAROSCOPY N/A 07/17/2015   Procedure: LAPAROSCOPY DIAGNOSTIC;  Surgeon: Emily Filbert, MD;  Location: [facility];  Service: Gynecology;  Laterality: N/A;  . TUBAL LIGATION    . UPPER GI ENDOSCOPY    . WISDOM TOOTH EXTRACTION      Family History  Problem Relation Age of Onset  . Hypertension Mother   . Anuerysm Mother     brain  . Migraines Mother   . Heart disease Father   . Hypertension Father   . Hyperlipidemia Father   . Prostate cancer Father   . Depression Brother   . Multiple sclerosis Maternal Grandmother   . Sudden death Maternal Grandmother   . Heart disease Maternal Grandfather   . Sudden death Maternal Grandfather   . Heart disease Paternal Grandmother   . Diabetes Paternal Grandmother   . Parkinson's disease Paternal Grandfather     Allergies  Allergen Reactions  . Latex Rash  . Reglan [Metoclopramide] Nausea Only  . Sertraline Rash    Current Outpatient Prescriptions on File Prior to Visit  Medication Sig Dispense Refill  . amLODipine (NORVASC) 5 MG tablet Take 1 tablet (5 mg total) by mouth daily. Will need follow up on her BP. 90 tablet 1  . buPROPion (WELLBUTRIN XL) 150 MG 24 hr tablet Take 1 tablet (150 mg total) by mouth daily. 90 tablet 3  . SUMAtriptan (IMITREX) 100 MG tablet Take 1 tablet (100 mg total) by mouth every 2 (two) hours as needed for migraine. May repeat in 2 hours if headache persists or recurs. 10 tablet 2  . SUMAtriptan (IMITREX) 20 MG/ACT nasal spray Place 1 spray (20 mg total) into the nose every 2 (two) hours as needed for migraine or headache. 6 Inhaler 2  . topiramate (TOPAMAX) 200 MG tablet Take 1 tablet (200 mg total) by mouth daily. 30 tablet 3   No current facility-administered medications on file prior to visit.     BP 130/90 (BP Location: Left Arm,  Patient Position: Sitting, Cuff Size: Normal)   Pulse 94   Temp 98.2 F (36.8 C) (Oral)   Ht 5\' 3"  (1.6 m)   Wt 148 lb (67.1 kg)   SpO2 99%   BMI 26.22 kg/m      Objective:   Physical Exam  Constitutional: She appears well-developed and well-nourished. She is cooperative.  Non-toxic appearance. She does not have a sickly appearance. She does not appear ill. No distress.  HENT:  Head: Normocephalic and atraumatic.  Right Ear: Tympanic membrane, external ear and ear canal normal. Tympanic membrane is not erythematous, not retracted and not bulging.  Left Ear: Tympanic membrane, external ear and ear canal normal. Tympanic membrane is not erythematous, not retracted and not bulging.  Nose: Nose normal. Right sinus exhibits no maxillary sinus tenderness and no frontal sinus tenderness. Left sinus exhibits no maxillary sinus tenderness and no frontal sinus tenderness.  Mouth/Throat: Uvula is midline. Posterior oropharyngeal erythema present. No posterior oropharyngeal edema.  Cardiovascular: Normal rate, regular rhythm and normal heart sounds.   Pulmonary/Chest: Effort normal and breath sounds normal. No accessory muscle usage. No respiratory distress. She has no decreased breath sounds. She has no wheezes. She has no rhonchi. She has no rales.  Lungs CTA bilaterally  Lymphadenopathy:    She has cervical adenopathy.  Neurological: She is alert.  Skin: Skin is warm, dry and intact.  Nursing note and vitals reviewed.  Influenza test: B positive     Assessment & Plan:  1. Influenza B POCT test positive. Treat with Tamiflu per orders. Albuterol inhaler prn for any chest tightness. She has some tessalon perles on hand if she needs them for cough. Encouraged fluids. Advised patient to follow-up with [place] or the ER if symptoms worsen, especially worsening fevers, SOB, or unable to stay hydrated.  Inda Coke PA-C 05/28/16

## 2016-05-29 LAB — POCT INFLUENZA A/B
Influenza A, POC: NEGATIVE
Influenza B, POC: POSITIVE — AB

## 2016-05-29 NOTE — Addendum Note (Signed)
Addended by: Marian Sorrow on: 05/29/2016 10:05 AM   Modules accepted: Orders

## 2016-07-09 ENCOUNTER — Encounter: Payer: Self-pay | Admitting: Physician Assistant

## 2016-07-09 ENCOUNTER — Ambulatory Visit (INDEPENDENT_AMBULATORY_CARE_PROVIDER_SITE_OTHER): Payer: 59 | Admitting: Physician Assistant

## 2016-07-09 VITALS — BP 141/96 | HR 67 | Temp 98.0°F | Ht 62.0 in | Wt 151.0 lb

## 2016-07-09 DIAGNOSIS — R5383 Other fatigue: Secondary | ICD-10-CM | POA: Diagnosis not present

## 2016-07-09 DIAGNOSIS — R109 Unspecified abdominal pain: Secondary | ICD-10-CM | POA: Diagnosis not present

## 2016-07-09 DIAGNOSIS — F419 Anxiety disorder, unspecified: Secondary | ICD-10-CM

## 2016-07-09 DIAGNOSIS — R197 Diarrhea, unspecified: Secondary | ICD-10-CM

## 2016-07-09 LAB — COMPREHENSIVE METABOLIC PANEL
ALK PHOS: 54 U/L (ref 39–117)
ALT: 9 U/L (ref 0–35)
AST: 13 U/L (ref 0–37)
Albumin: 4 g/dL (ref 3.5–5.2)
BUN: 8 mg/dL (ref 6–23)
CHLORIDE: 103 meq/L (ref 96–112)
CO2: 30 meq/L (ref 19–32)
Calcium: 9 mg/dL (ref 8.4–10.5)
Creatinine, Ser: 0.69 mg/dL (ref 0.40–1.20)
GFR: 103.35 mL/min (ref >60.00)
GLUCOSE: 90 mg/dL (ref 70–99)
POTASSIUM: 3.8 meq/L (ref 3.5–5.1)
Sodium: 139 mEq/L (ref 135–145)
Total Bilirubin: 0.3 mg/dL (ref 0.2–1.2)
Total Protein: 6.8 g/dL (ref 6.0–8.3)

## 2016-07-09 LAB — T4, FREE: FREE T4: 1.05 ng/dL (ref 0.60–1.60)

## 2016-07-09 LAB — CBC WITH DIFFERENTIAL/PLATELET
Basophils Absolute: 0 10*3/uL (ref 0.0–0.1)
Basophils Relative: 0.3 % (ref 0.0–3.0)
Eosinophils Absolute: 0.1 10*3/uL (ref 0.0–0.7)
Eosinophils Relative: 0.8 % (ref 0.0–5.0)
HCT: 41.5 % (ref 36.0–46.0)
Hemoglobin: 13.7 g/dL (ref 12.0–15.0)
LYMPHS ABS: 1.9 10*3/uL (ref 0.7–4.0)
Lymphocytes Relative: 25.6 % (ref 12.0–46.0)
MCHC: 33 g/dL (ref 30.0–36.0)
MCV: 86.3 fl (ref 78.0–100.0)
MONO ABS: 0.6 10*3/uL (ref 0.1–1.0)
MONOS PCT: 8.8 % (ref 3.0–12.0)
NEUTROS ABS: 4.8 10*3/uL (ref 1.4–7.7)
NEUTROS PCT: 64.5 % (ref 43.0–77.0)
PLATELETS: 279 10*3/uL (ref 150.0–400.0)
RBC: 4.81 Mil/uL (ref 3.87–5.11)
RDW: 14.4 % (ref 11.5–15.5)
WBC: 7.4 10*3/uL (ref 4.0–10.5)

## 2016-07-09 LAB — VITAMIN D 25 HYDROXY (VIT D DEFICIENCY, FRACTURES): VITD: 20.41 ng/mL — ABNORMAL LOW (ref 30.00–100.00)

## 2016-07-09 LAB — MAGNESIUM: Magnesium: 1.9 mg/dL (ref 1.5–2.5)

## 2016-07-09 LAB — VITAMIN B12: Vitamin B-12: 274 pg/mL (ref 211–911)

## 2016-07-09 LAB — TSH: TSH: 0.9 u[IU]/mL (ref 0.35–4.50)

## 2016-07-09 NOTE — Patient Instructions (Addendum)
We will call you with your lab results. If you develop any chest pain, shortness of breath or thoughts of hurting yourself or others, please go to the emergency room. I have put in the psychiatrist referral, however you may contact one on your own.  We will see you next week for your physical!!! It was so good to see you today!   Generalized Anxiety Disorder, Adult Generalized anxiety disorder (GAD) is a mental health disorder. People with this condition constantly worry about everyday events. Unlike normal anxiety, worry related to GAD is not triggered by a specific event. These worries also do not fade or get better with time. GAD interferes with life functions, including relationships, work, and school. GAD can vary from mild to severe. People with severe GAD can have intense waves of anxiety with physical symptoms (panic attacks). What are the causes? The exact cause of GAD is not known. What increases the risk? This condition is more likely to develop in:  Women.  People who have a family history of anxiety disorders.  People who are very shy.  People who experience very stressful life events, such as the death of a loved one.  People who have a very stressful family environment. What are the signs or symptoms? People with GAD often worry excessively about many things in their lives, such as their health and family. They may also be overly concerned about:  Doing well at work.  Being on time.  Natural disasters.  Friendships. Physical symptoms of GAD include:  Fatigue.  Muscle tension or having muscle twitches.  Trembling or feeling shaky.  Being easily startled.  Feeling like your heart is pounding or racing.  Feeling out of breath or like you cannot take a deep breath.  Having trouble falling asleep or staying asleep.  Sweating.  Nausea, diarrhea, or irritable bowel syndrome (IBS).  Headaches.  Trouble concentrating or remembering  facts.  Restlessness.  Irritability. How is this diagnosed? Your health care provider can diagnose GAD based on your symptoms and medical history. You will also have a physical exam. The health care provider will ask specific questions about your symptoms, including how severe they are, when they started, and if they come and go. Your health care provider may ask you about your use of alcohol or drugs, including prescription medicines. Your health care provider may refer you to a mental health specialist for further evaluation. Your health care provider will do a thorough examination and may perform additional tests to rule out other possible causes of your symptoms. To be diagnosed with GAD, a person must have anxiety that:  Is out of his or her control.  Affects several different aspects of his or her life, such as work and relationships.  Causes distress that makes him or her unable to take part in normal activities.  Includes at least three physical symptoms of GAD, such as restlessness, fatigue, trouble concentrating, irritability, muscle tension, or sleep problems. Before your health care provider can confirm a diagnosis of GAD, these symptoms must be present more days than they are not, and they must last for six months or longer. How is this treated? The following therapies are usually used to treat GAD:  Medicine. Antidepressant medicine is usually prescribed for long-term daily control. Antianxiety medicines may be added in severe cases, especially when panic attacks occur.  Talk therapy (psychotherapy). Certain types of talk therapy can be helpful in treating GAD by providing support, education, and guidance. Options include:  Cognitive  behavioral therapy (CBT). People learn coping skills and techniques to ease their anxiety. They learn to identify unrealistic or negative thoughts and behaviors and to replace them with positive ones.  Acceptance and commitment therapy (ACT).  This treatment teaches people how to be mindful as a way to cope with unwanted thoughts and feelings.  Biofeedback. This process trains you to manage your body's response (physiological response) through breathing techniques and relaxation methods. You will work with a therapist while machines are used to monitor your physical symptoms.  Stress management techniques. These include yoga, meditation, and exercise. A mental health specialist can help determine which treatment is best for you. Some people see improvement with one type of therapy. However, other people require a combination of therapies. Follow these instructions at home:  Take over-the-counter and prescription medicines only as told by your health care provider.  Try to maintain a normal routine.  Try to anticipate stressful situations and allow extra time to manage them.  Practice any stress management or self-calming techniques as taught by your health care provider.  Do not punish yourself for setbacks or for not making progress.  Try to recognize your accomplishments, even if they are small.  Keep all follow-up visits as told by your health care provider. This is important. Contact a health care provider if:  Your symptoms do not get better.  Your symptoms get worse.  You have signs of depression, such as:  A persistently sad, cranky, or irritable mood.  Loss of enjoyment in activities that used to bring you joy.  Change in weight or eating.  Changes in sleeping habits.  Avoiding friends or family members.  Loss of energy for normal tasks.  Feelings of guilt or worthlessness. Get help right away if:  You have serious thoughts about hurting yourself or others. If you ever feel like you may hurt yourself or others, or have thoughts about taking your own life, get help right away. You can go to your nearest emergency department or call:  Your local emergency services (911 in the [place]  A suicide crisis  helpline, such as the [place] [facility] at (781)183-2575. This is open 24 hours a day. Summary  Generalized anxiety disorder (GAD) is a mental health disorder that involves worry that is not triggered by a specific event.  People with GAD often worry excessively about many things in their lives, such as their health and family.  GAD may cause physical symptoms such as restlessness, trouble concentrating, sleep problems, frequent sweating, nausea, diarrhea, headaches, and trembling or muscle twitching.  A mental health specialist can help determine which treatment is best for you. Some people see improvement with one type of therapy. However, other people require a combination of therapies. This information is not intended to replace advice given to you by your health care provider. Make sure you discuss any questions you have with your health care provider. Document Released: 07/26/2012 Document Revised: 02/19/2016 Document Reviewed: 02/19/2016 Elsevier Interactive Patient Education  2017 [employer].   High-Fiber Diet Fiber, also called dietary fiber, is a type of carbohydrate found in fruits, vegetables, whole grains, and beans. A high-fiber diet can have many health benefits. Your health care provider may recommend a high-fiber diet to help:  Prevent constipation. Fiber can make your bowel movements more regular.  Lower your cholesterol.  Relieve hemorrhoids, uncomplicated diverticulosis, or irritable bowel syndrome.  Prevent overeating as part of a weight-loss plan.  Prevent heart disease, type 2 diabetes, and certain  cancers. What is my plan? The recommended daily intake of fiber includes:  38 grams for men under age 46.  43 grams for men over age 108.  67 grams for women under age 10.  79 grams for women over age 20. You can get the recommended daily intake of dietary fiber by eating a variety of fruits, vegetables, grains, and beans. Your health care  provider may also recommend a fiber supplement if it is not possible to get enough fiber through your diet. What do I need to know about a high-fiber diet?  Fiber supplements have not been widely studied for their effectiveness, so it is better to get fiber through food sources.  Always check the fiber content on thenutrition facts label of any prepackaged food. Look for foods that contain at least 5 grams of fiber per serving.  Ask your dietitian if you have questions about specific foods that are related to your condition, especially if those foods are not listed in the following section.  Increase your daily fiber consumption gradually. Increasing your intake of dietary fiber too quickly may cause bloating, cramping, or gas.  Drink plenty of water. Water helps you to digest fiber. What foods can I eat? Grains  Whole-grain breads. Multigrain cereal. Oats and oatmeal. Brown rice. Barley. Bulgur wheat. [place]. Bran muffins. Popcorn. Rye wafer crackers. Vegetables  Sweet potatoes. Spinach. Kale. Artichokes. Cabbage. Broccoli. Green peas. Carrots. Squash. Fruits  Berries. Pears. Apples. Oranges. Avocados. Prunes and raisins. Dried figs. Meats and Other Protein Sources  [place], kidney, pinto, and soy beans. Split peas. Lentils. Nuts and seeds. Dairy  Fiber-fortified yogurt. Beverages  Fiber-fortified soy milk. Fiber-fortified orange juice. Other  Fiber bars. The items listed above may not be a complete list of recommended foods or beverages. Contact your dietitian for more options.  What foods are not recommended? Grains  White bread. Pasta made with refined flour. White rice. Vegetables  Fried potatoes. Canned vegetables. Well-cooked vegetables. Fruits  Fruit juice. Cooked, strained fruit. Meats and Other Protein Sources  Fatty cuts of meat. Fried Sales executive or fried fish. Dairy  Milk. Yogurt. Cream cheese. Sour cream. Beverages  Soft drinks. Other  Cakes and pastries. Butter and  oils. The items listed above may not be a complete list of foods and beverages to avoid. Contact your dietitian for more information.  What are some tips for including high-fiber foods in my diet?  Eat a wide variety of high-fiber foods.  Make sure that half of all grains consumed each day are whole grains.  Replace breads and cereals made from refined flour or white flour with whole-grain breads and cereals.  Replace white rice with brown rice, bulgur wheat, or millet.  Start the day with a breakfast that is high in fiber, such as a cereal that contains at least 5 grams of fiber per serving.  Use beans in place of meat in soups, salads, or pasta.  Eat high-fiber snacks, such as berries, raw vegetables, nuts, or popcorn. This information is not intended to replace advice given to you by your health care provider. Make sure you discuss any questions you have with your health care provider. Document Released: 03/31/2005 Document Revised: 09/06/2015 Document Reviewed: 09/13/2013 Elsevier Interactive Patient Education  2017 [employer].

## 2016-07-09 NOTE — Progress Notes (Addendum)
Alisha Hughes is a 36 y.o. female here for anxiety issue and panic attacks.   History of Present Illness:   Chief Complaint  Patient presents with  . Establish Care  . Anxiety    worse past weeks  . panic attacks    Acute Concerns: Anxiety -- Has had anxiety for several years. Over the past few days she feels like she has had uncontrolled anxiety. She is currently on Wellbutrin 150 mg. She has been having some panic attacks that have been waking her up out of her sleep. She describes herself as feeling irritable. Her sleep has been affected. Has difficulty falling asleep. She has had some intermittent chest discomfort that lasts for just a few seconds that she is associating with her stress. No shortness of breath, fever, radiation of pain. She is not exactly sure all the medications that she is try for anxiety. She thinks that she has tried xanax, lexapro, zoloft, and celexa; I am requesting her records I can get more information on this. She has never seen a psychiatrist but is willing to go. She has had some fatigue associated with this as well. Abdominal pain/nausea -- associated with stress, has had diarrhea for several years, hx of IBS in late 1990s, not a day that does not have diarrhea, has not associated this with certain foods, she does have a sister, has celiac disease and she is interested in being tested for this today; she does admit that she eats a lot of food outside of her house, and she eats very little fiber; no blood in diarrhea; does have diarrhea approximately 1x daily  Last period was 06/23/16 -- had a tubal ligation  Chronic Issues: Migraine -- seen by [facility] neurology; gabapentin only worked for a bit; then received a nerve block and has had no headaches since; no longer on the imitrex or topomax HTN -- started with pregnancies, takes Norvasc 5 mg, no HA, swelling in legs  Health Maintenance: Immunizations -- UTD Colonoscopy -- no family history Mammogram  -- no family history or personal hx of lumps PAP -- Ob-Gyn manages, thinks she is up to date, we are requesting these records Bone density -- a long time ago, she thinks she had a study done  Diet -- eats all food groups, eats a lot of food outside of the house Caffeine intake -- drinks 1 can of pepsi, 16 oz coffee daily Sleep habits -- doesn't sleep throughout night, difficulty falling asleep Exercise -- running Weight -- Weight: 151 lb (68.5 kg) -- this is higher than she would like to be Mood -- irritable today  Depression screen [facility] 2/9 07/09/2016  Decreased Interest 3  Down, Depressed, Hopeless 0  PHQ - 2 Score 3  Altered sleeping 1  Tired, decreased energy 1  Change in appetite 1  Feeling bad or failure about yourself  0  Trouble concentrating 1  Moving slowly or fidgety/restless 1  Suicidal thoughts 0  PHQ-9 Score 8    Other providers/specialists: Dr. Manuella Ghazi and Melrose Nakayama -- [facility] neurology for migraines [facility]  PMHx, SurgHx, SocialHx, Medications, and Allergies were reviewed in the Visit Navigator and updated as appropriate.  Current Medications:   Current Outpatient Prescriptions:  .  amLODipine (NORVASC) 5 MG tablet, Take 1 tablet (5 mg total) by mouth daily. Will need follow up on her BP., Disp: 90 tablet, Rfl: 1 .  buPROPion (WELLBUTRIN XL) 150 MG 24 hr tablet, Take 1 tablet (150  mg total) by mouth daily., Disp: 90 tablet, Rfl: 3 .  albuterol (PROVENTIL HFA;VENTOLIN HFA) 108 (90 Base) MCG/ACT inhaler, Inhale 2 puffs into the lungs every 6 (six) hours as needed for wheezing or shortness of breath. (Patient not taking: Reported on 07/09/2016), Disp: 1 Inhaler, Rfl: 0 .  gabapentin (NEURONTIN) 100 MG capsule, Take 100 mg twice a day for one week, then increase to 200 mg(2 tablets) twice a day and continue, Disp: , Rfl:  .  topiramate (TOPAMAX) 200 MG tablet, Take 1 tablet (200 mg total) by mouth daily. (Patient not taking: Reported on 07/09/2016), Disp:  30 tablet, Rfl: 3   Review of Systems:   Review of Systems  Constitutional: Negative.   HENT: Negative.   Eyes: Negative.   Respiratory: Negative.   Cardiovascular: Positive for chest pain.  Gastrointestinal: Positive for abdominal pain, diarrhea and nausea.       Abdomial pain and diarrhea x 3 days  Genitourinary: Negative.   Musculoskeletal:       Tired and achey  Skin: Negative.   Neurological: Positive for dizziness and headaches.  Endo/Heme/Allergies: Bruises/bleeds easily.  Psychiatric/Behavioral: The patient is nervous/anxious and has insomnia.     Vitals:   Vitals:   07/09/16 1102  BP: (!) 141/96  Pulse: 67  Temp: 98 F (36.7 C)  TempSrc: Oral  SpO2: 97%  Weight: 151 lb (68.5 kg)  Height: 5\' 2"  (1.575 m)     Body mass index is 27.62 kg/m.  Physical Exam:   Physical Exam  Constitutional: She appears well-developed. She is cooperative.  Non-toxic appearance. She does not have a sickly appearance. She does not appear ill. No distress.  Cardiovascular: Normal rate, regular rhythm, S1 normal, S2 normal, normal heart sounds and normal pulses.   No LE edema  Pulmonary/Chest: Effort normal and breath sounds normal.  Abdominal: Soft. Normal appearance and bowel sounds are normal. There is generalized tenderness.  Neurological: She is alert.  Psychiatric: Her mood appears anxious.  Nursing note and vitals reviewed.     Assessment and Plan:    Alisha Hughes was seen today for establish care, anxiety and panic attacks.  Diagnoses and all orders for this visit:  Abdominal pain, unspecified abdominal location -     CBC with Differential/Platelet -     Comprehensive metabolic panel -     Magnesium  Anxiety -     Magnesium -     T4, free -     TSH -     VITAMIN D 25 Hydroxy (Vit-D Deficiency, Fractures) -     Cancel: Vitamin B12 -     Vitamin B12 -     Ambulatory referral to Psychiatry  Diarrhea, unspecified type -     Celiac Pnl 2 rflx Endomysial Ab  Ttr  Other fatigue -     Magnesium -     T4, free -     TSH -     VITAMIN D 25 Hydroxy (Vit-D Deficiency, Fractures) -     Vitamin B12   I do not have records of what patient has tried in the past in terms of anxiety medications. She states that she has been on several different ones and could not tolerate a lot of them. She is agreeable to going to psychiatry for formal evaluation of her anxiety as well as discussion of medication management. I agree with this and provided list of recommendations of where she could go. In terms of her diarrhea, I believe that is  associated with anxiety and may be attributed to her history of IBS. I discussed with her that the celiac panel may be expensive however she is agreeable to having the testing today because she would like to know the results. I will also order some routine labs to assess for any organic cause of anxiety. I advised patient if she develops any chest pain, shortness of breath, suicidal thoughts that she should go to the emergency room.   She has an appointment with [place] next week for a physical and we can revisit these issues at that time.   . Reviewed expectations re: course of current medical issues. . Discussed self-management of symptoms. . Outlined signs and symptoms indicating need for more acute intervention. . Patient verbalized understanding and all questions were answered. . See orders for this visit as documented in the electronic medical record. . Patient received an After-Visit Summary.   Inda Coke, PA-C

## 2016-07-09 NOTE — Progress Notes (Signed)
Pre visit review using our clinic review tool, if applicable. No additional management support is needed unless otherwise documented below in the visit note. 

## 2016-07-14 NOTE — Progress Notes (Signed)
Subjective:    Alisha Hughes is a 36 y.o. female and is here for a comprehensive physical exam.  HPI  There are no preventive care reminders to display for this patient.  Acute Concerns: UTI -  Recent increase in sexual intercourse over the past few days, did get period a week and a half early, urine has a strong ammonia smell, endorses some burning with urination, no fever, does have a history of kidney infection, not taking AZO, is trying to push water, most recent UTI was 6 months ago  Chronic Issues: Migraine -- well controlled since nerve block, no migraines, does get hormonal HA occasionally Essential HTN -- Norvasc 5mg , never checks blood pressure at home, occasional chest tightness that she associates with anxiety, no SOB of breath, no changes in vision Anxiety and depression -- anxiety improved slightly since last week, she is pursing a psychiatrist for evaluation, and we are requesting her records to see what medications she has tried, she is afraid to try something that may make her gain weight Abdominal pain/nausea -- continues to have intermittent abdominal pain, nausea and diarrhea, she does have a history of IBS Vit D deficiency -- takes her supplement 2 or 3 nights a week, can cause upset stomach  Health Maintenance: Immunizations -- UTD Colonoscopy -- no family history Mammogram -- no family history or personal hx of lumps PAP -- Ob-Gyn manages, thinks she is up to date, we are requesting these records Bone density -- a long time ago, she thinks she had a study done  Diet -- eats all food groups, eats a lot of food outside of the house Caffeine intake -- drinks 1 can of pepsi, 16 oz coffee daily Sleep habits -- doesn't sleep throughout night, difficulty falling asleep Exercise -- running Weight -- Weight: 150.6 lb -- this is higher than she would like to be Mood -- ess irritable today  Depression screen PHQ 2/9 07/15/2016  Decreased Interest 1  Down, Depressed,  Hopeless 1  PHQ - 2 Score 2  Altered sleeping 3  Tired, decreased energy 2  Change in appetite 2  Feeling bad or failure about yourself  0  Trouble concentrating 3  Moving slowly or fidgety/restless 2  Suicidal thoughts 0  PHQ-9 Score 14   Other providers/specialists: Dr. Manuella Ghazi and Melrose Nakayama -- [facility] neurolog[facility] for migraines [facility]  PMHx, SurgHx, SocialHx, Medications, and Allergies were reviewed in the Visit Navigator and updated as appropriate.   Past Medical History:  Diagnosis Date  . Anxiety   . Anxiety and depression   . Depression   . Hyperlipidemia      Past Surgical History:  Procedure Laterality Date  . CESAREAN SECTION    . CESAREAN SECTION N/A 12/19/2012   Procedure: Repeat cesarean section with delivery of baby girl at [facility].    Bilateral tubal ligation with filshie clips.;  Surgeon: Donnamae Jude, MD;  Location: [facility];  Service: Obstetrics;  Laterality: N/A;  . CHOLECYSTECTOMY  2010  . COLONOSCOPY     2009  . COLPOSCOPY VULVA W/ BIOPSY     abnormal pap  . ENDOMETRIAL ABLATION N/A 07/17/2015   Procedure: ENDOMETRIAL ABLATION WITH HTA, DILATATION AND CURRATAGE;  Surgeon: Emily Filbert, MD;  Location: [facility];  Service: Gynecology;  Laterality: N/A;  . KNEE ARTHROSCOPY    . LAPAROSCOPIC LYSIS OF ADHESIONS  07/17/2015   Procedure: LAPAROSCOPIC LYSIS OF ADHESIONS;  Surgeon: Emily Filbert, MD;  Location:  [facility];  Service: Gynecology;;  . LAPAROSCOPY N/A 07/17/2015   Procedure: LAPAROSCOPY DIAGNOSTIC;  Surgeon: Emily Filbert, MD;  Location: [facility];  Service: Gynecology;  Laterality: N/A;  . TUBAL LIGATION    . UPPER GI ENDOSCOPY    . WISDOM TOOTH EXTRACTION       Family History  Problem Relation Age of Onset  . Hypertension Mother   . Anuerysm Mother     brain  . Migraines Mother   . Heart disease Father   . Hypertension Father   . Hyperlipidemia Father   . Prostate cancer Father   . Depression Brother   . Multiple sclerosis Maternal Grandmother    . Sudden death Maternal Grandmother   . Heart disease Maternal Grandfather   . Sudden death Maternal Grandfather   . Heart disease Paternal Grandmother   . Diabetes Paternal Grandmother   . Parkinson's disease Paternal Grandfather     Social History  Substance Use Topics  . Smoking status: Never Smoker  . Smokeless tobacco: Never Used  . Alcohol use 0.0 oz/week     Comment: rarely    Review of Systems:   Review of Systems  Cardiovascular:       Chest tightness due to anxiety  Gastrointestinal: Positive for abdominal pain and diarrhea.  Genitourinary: Positive for dysuria and frequency.  Musculoskeletal: Positive for myalgias.  Psychiatric/Behavioral: The patient is nervous/anxious and has insomnia.     Objective:   BP 120/88 (BP Location: Left Arm, Cuff Size: Normal)   Pulse 76   Temp 98.3 F (36.8 C) (Oral)   Ht 5\' 2"  (1.575 m)   Wt 150 lb 6.1 oz (68.2 kg)   LMP 06/23/2016 Comment: Tubal Ligation  BMI 27.50 kg/m   General Appearance:    Alert, cooperative, no distress, appears stated age  Head:    Normocephalic, without obvious abnormality, atraumatic  Eyes:    PERRL, conjunctiva/corneas clear, EOM's intact, fundi    benign, both eyes  Ears:    Normal TM's and external ear canals, both ears  Nose:   Nares normal, septum midline, mucosa normal, no drainage    or sinus tenderness  Throat:   Lips, mucosa, and tongue normal; teeth and gums normal  Neck:   Supple, symmetrical, trachea midline, no adenopathy;    thyroid:  no enlargement/tenderness/nodules; no carotid   bruit or JVD  Back:     Symmetric, no curvature, ROM normal, no CVA tenderness  Lungs:     Clear to auscultation bilaterally, respirations unlabored  Chest Wall:    No tenderness or deformity   Heart:    Regular rate and rhythm, S1 and S2 normal, no murmur, rub   or gallop  Breast Exam:    No tenderness, masses, or nipple abnormality  Abdomen:     Soft, generalized tenderness, bowel sounds active all  four quadrants,    no masses, no organomegaly; no CVA tenderness  Genitalia:    Normal female without lesion, discharge or tenderness  Rectal:    Normal tone, normal prostate, no masses or tenderness;   guaiac negative stool  Extremities:   Extremities normal, atraumatic, no cyanosis or edema  Pulses:   2+ and symmetric all extremities  Skin:   Skin color, texture, turgor normal, no rashes or lesions  Lymph nodes:   Cervical, supraclavicular, and axillary nodes normal  Neurologic:   CNII-XII intact, normal strength, sensation and reflexes    throughout   EKG tracing is personally reviewed.  EKG notes NSR.  No acute changes.   Results for orders placed or performed in visit on 07/15/16  POCT urinalysis dipstick  Result Value Ref Range   Color, UA Yellow    Clarity, UA Slightly Cloudy    Glucose, UA Negative    Bilirubin, UA Negative    Ketones, UA Negative    Spec Grav, UA 1.015 1.030 - 1.035   Blood, UA 25 Ery/uL    pH, UA 7.0 5.0 - 8.0   Protein, UA Negative    Urobilinogen, UA 0.2 Negative - 2.0   Nitrite, UA Negative    Leukocytes, UA small (1+) (A) Negative     Assessment/Plan:   Problem List Items Addressed This Visit      Cardiovascular and Mediastinum   Chronic migraine without aura    Well controlled. Sees neurology at [facility]. Had a nerve block and no migraines since.      Essential hypertension    Well controlled. Norvasc 5 mg. Baseline EKG down today, NSR.        Digestive   Irritable bowel syndrome with diarrhea    Celiac panel was negative. Discussed adding in fiber. Will add Bentyl. If no improvement, will send to GI.      Relevant Medications   dicyclomine (BENTYL) 10 MG capsule     Other   Anxiety and depression    Somewhat improved since last week. We have given her a list of psychiatrists to choose from. She is also trying to contact some on her own.       Vitamin D deficiency    Labs confirm deficiency. Discussed taking medication regularly.        Other Visit Diagnoses    Dysuria    -  Primary Small amount of leuks, and is having some burning. Will start [place]. Follow-up if symptoms do not improve.   Relevant Orders   POCT urinalysis dipstick (Completed)   Urine culture   Chest tightness or pressure     EKG normal. I suspect that this is related to anxiety. Advised patient that if her symptoms do not improve, we will send to cardiology. If severe worsening of symptoms, she should go to the ER. Patient agreeable to plan.   Relevant Orders   EKG 12-Lead (Completed)   Encounter for general adult medical examination with abnormal findings    Today patient counseled on age appropriate routine health concerns for screening and prevention, each reviewed and up to date or declined. Immunizations reviewed and up to date or declined. Labs ordered and reviewed. Risk factors for depression reviewed and negative. Hearing function and visual acuity are intact. ADLs screened and addressed as needed. Functional ability and level of safety reviewed and appropriate. Education, counseling and referrals performed based on assessed risks today. Patient provided with a copy of personalized plan for preventive services.     Relevant Orders   Lipid panel      Well Adult Exam: Labs ordered: Yes. Patient counseling was done. See below for items discussed. Discussed the patient's BMI. The BMI BMI is not in the acceptable range; BMI management plan is completed Follow up as needed for acute illness.  Patient Counseling:   [x]     Nutrition: Stressed importance of moderation in sodium/caffeine intake, saturated fat and cholesterol, caloric balance, sufficient intake of fresh fruits, vegetables, fiber, calcium, iron, and 1 mg of folate supplement per day (for females capable of pregnancy).   [x]      Stressed the importance  of regular exercise.    []     Substance Abuse: Discussed cessation/primary prevention of tobacco, alcohol, or other drug use;  driving or other dangerous activities under the influence; availability of treatment for abuse.    []      Injury prevention: Discussed safety belts, safety helmets, smoke detector, smoking near bedding or upholstery.    []      Sexuality: Discussed sexually transmitted diseases, partner selection, use of condoms, avoidance of unintended pregnancy  and contraceptive alternatives.    [x]     Dental health: Discussed importance of regular tooth brushing, flossing, and dental visits.   [x]      Health maintenance and immunizations reviewed. Please refer to Health maintenance section.    Inda Coke, PA-C [facility]

## 2016-07-14 NOTE — Progress Notes (Signed)
Pre visit review using our clinic review tool, if applicable. No additional management support is needed unless otherwise documented below in the visit note. 

## 2016-07-15 ENCOUNTER — Encounter: Payer: Self-pay | Admitting: Physician Assistant

## 2016-07-15 ENCOUNTER — Ambulatory Visit (INDEPENDENT_AMBULATORY_CARE_PROVIDER_SITE_OTHER): Payer: 59 | Admitting: Physician Assistant

## 2016-07-15 VITALS — BP 120/88 | HR 76 | Temp 98.3°F | Ht 62.0 in | Wt 150.4 lb

## 2016-07-15 DIAGNOSIS — F329 Major depressive disorder, single episode, unspecified: Secondary | ICD-10-CM

## 2016-07-15 DIAGNOSIS — F32A Depression, unspecified: Secondary | ICD-10-CM

## 2016-07-15 DIAGNOSIS — K580 Irritable bowel syndrome with diarrhea: Secondary | ICD-10-CM

## 2016-07-15 DIAGNOSIS — R300 Dysuria: Secondary | ICD-10-CM | POA: Diagnosis not present

## 2016-07-15 DIAGNOSIS — F418 Other specified anxiety disorders: Secondary | ICD-10-CM

## 2016-07-15 DIAGNOSIS — G43709 Chronic migraine without aura, not intractable, without status migrainosus: Secondary | ICD-10-CM

## 2016-07-15 DIAGNOSIS — F419 Anxiety disorder, unspecified: Secondary | ICD-10-CM

## 2016-07-15 DIAGNOSIS — I10 Essential (primary) hypertension: Secondary | ICD-10-CM

## 2016-07-15 DIAGNOSIS — E559 Vitamin D deficiency, unspecified: Secondary | ICD-10-CM | POA: Insufficient documentation

## 2016-07-15 DIAGNOSIS — R0789 Other chest pain: Secondary | ICD-10-CM | POA: Diagnosis not present

## 2016-07-15 DIAGNOSIS — Z0001 Encounter for general adult medical examination with abnormal findings: Secondary | ICD-10-CM

## 2016-07-15 LAB — POCT URINALYSIS DIPSTICK
Bilirubin, UA: NEGATIVE
Blood, UA: 25
Glucose, UA: NEGATIVE
KETONES UA: NEGATIVE
Nitrite, UA: NEGATIVE
PH UA: 7 (ref 5.0–8.0)
PROTEIN UA: NEGATIVE
SPEC GRAV UA: 1.015 (ref 1.030–1.035)
Urobilinogen, UA: 0.2 (ref ?–2.0)

## 2016-07-15 LAB — CELIAC PNL 2 RFLX ENDOMYSIAL AB TTR
(tTG) Ab, IgA: <1 U/mL
(tTG) Ab, IgG: 2 U/mL
ENDOMYSIAL AB IGA: NEGATIVE
GLIADIN(DEAM) AB,IGA: 8 U (ref <20)
Gliadin(Deam) Ab,IgG: 2 U (ref <20)
Immunoglobulin A: 233 mg/dL (ref 81–463)

## 2016-07-15 LAB — LIPID PANEL
Cholesterol: 257 mg/dL — ABNORMAL HIGH (ref 0–200)
HDL: 79 mg/dL (ref >39.00)
LDL Cholesterol: 158 mg/dL — ABNORMAL HIGH (ref 0–99)
NONHDL: 178.3
Total CHOL/HDL Ratio: 3
Triglycerides: 100 mg/dL (ref 0.0–149.0)
VLDL: 20 mg/dL (ref 0.0–40.0)

## 2016-07-15 MED ORDER — DICYCLOMINE HCL 10 MG PO CAPS: 10.0000 mg | ORAL_CAPSULE | Freq: Three times a day (TID) | ORAL | 0 refills | Status: DC

## 2016-07-15 MED ORDER — NITROFURANTOIN MONOHYD MACRO 100 MG PO CAPS: 100.0000 mg | ORAL_CAPSULE | Freq: Two times a day (BID) | ORAL | 0 refills | Status: DC

## 2016-07-15 NOTE — Assessment & Plan Note (Signed)
Celiac panel was negative. Discussed adding in fiber. Will add Bentyl. If no improvement, will send to GI.

## 2016-07-15 NOTE — Patient Instructions (Addendum)
We will call you with your lab results and send you our updated psychiatry list once we get it.   Dr. Cephus Shelling [phone]  Health Maintenance, Female Adopting a healthy lifestyle and getting preventive care can go a long way to promote health and wellness. Talk with your health care provider about what schedule of regular examinations is right for you. This is a good chance for you to check in with your provider about disease prevention and staying healthy. In between checkups, there are plenty of things you can do on your own. Experts have done a lot of research about which lifestyle changes and preventive measures are most likely to keep you healthy. Ask your health care provider for more information. Weight and diet Eat a healthy diet  Be sure to include plenty of vegetables, fruits, low-fat dairy products, and lean protein.  Do not eat a lot of foods high in solid fats, added sugars, or salt.  Get regular exercise. This is one of the most important things you can do for your health.  Most adults should exercise for at least 150 minutes each week. The exercise should increase your heart rate and make you sweat (moderate-intensity exercise).  Most adults should also do strengthening exercises at least twice a week. This is in addition to the moderate-intensity exercise. Maintain a healthy weight  Body mass index (BMI) is a measurement that can be used to identify possible weight problems. It estimates body fat based on height and weight. Your health care provider can help determine your BMI and help you achieve or maintain a healthy weight.  For females 35 years of age and older:  A BMI below 18.5 is considered underweight.  A BMI of 18.5 to 24.9 is normal.  A BMI of 25 to 29.9 is considered overweight.  A BMI of 30 and above is considered obese. Watch levels of cholesterol and blood lipids  You should start having your blood tested for lipids and cholesterol at 35 years  of age, then have this test every 5 years.  You may need to have your cholesterol levels checked more often if:  Your lipid or cholesterol levels are high.  You are older than 35 years of age.  You are at high risk for heart disease. Cancer screening Lung Cancer  Lung cancer screening is recommended for adults 44-39 years old who are at high risk for lung cancer because of a history of smoking.  A yearly low-dose CT scan of the lungs is recommended for people who:  Currently smoke.  Have quit within the past 15 years.  Have at least a 30-pack-year history of smoking. A pack year is smoking an average of one pack of cigarettes a day for 1 year.  Yearly screening should continue until it has been 15 years since you quit.  Yearly screening should stop if you develop a health problem that would prevent you from having lung cancer treatment. Breast Cancer  Practice breast self-awareness. This means understanding how your breasts normally appear and feel.  It also means doing regular breast self-exams. Let your health care provider know about any changes, no matter how small.  If you are in your 20s or 30s, you should have a clinical breast exam (CBE) by a health care provider every 1-3 years as part of a regular health exam.  If you are 29 or older, have a CBE every year. Also consider having a breast X-ray (mammogram) every year.  If you  have a family history of breast cancer, talk to your health care provider about genetic screening.  If you are at high risk for breast cancer, talk to your health care provider about having an MRI and a mammogram every year.  Breast cancer gene (BRCA) assessment is recommended for women who have family members with BRCA-related cancers. BRCA-related cancers include:  Breast.  Ovarian.  Tubal.  Peritoneal cancers.  Results of the assessment will determine the need for genetic counseling and BRCA1 and BRCA2 testing. Cervical Cancer  Your  health care provider may recommend that you be screened regularly for cancer of the pelvic organs (ovaries, uterus, and vagina). This screening involves a pelvic examination, including checking for microscopic changes to the surface of your cervix (Pap test). You may be encouraged to have this screening done every 3 years, beginning at age 1.  For women ages 81-65, health care providers may recommend pelvic exams and Pap testing every 3 years, or they may recommend the Pap and pelvic exam, combined with testing for human papilloma virus (HPV), every 5 years. Some types of HPV increase your risk of cervical cancer. Testing for HPV may also be done on women of any age with unclear Pap test results.  Other health care providers may not recommend any screening for nonpregnant women who are considered low risk for pelvic cancer and who do not have symptoms. Ask your health care provider if a screening pelvic exam is right for you.  If you have had past treatment for cervical cancer or a condition that could lead to cancer, you need Pap tests and screening for cancer for at least 20 years after your treatment. If Pap tests have been discontinued, your risk factors (such as having a new sexual partner) need to be reassessed to determine if screening should resume. Some women have medical problems that increase the chance of getting cervical cancer. In these cases, your health care provider may recommend more frequent screening and Pap tests. Colorectal Cancer  This type of cancer can be detected and often prevented.  Routine colorectal cancer screening usually begins at 35 years of age and continues through 36 years of age.  Your health care provider may recommend screening at an earlier age if you have risk factors for colon cancer.  Your health care provider may also recommend using home test kits to check for hidden blood in the stool.  A small camera at the end of a tube can be used to examine your  colon directly (sigmoidoscopy or colonoscopy). This is done to check for the earliest forms of colorectal cancer.  Routine screening usually begins at age 55.  Direct examination of the colon should be repeated every 5-10 years through 35 years of age. However, you may need to be screened more often if early forms of precancerous polyps or small growths are found. Skin Cancer  Check your skin from head to toe regularly.  Tell your health care provider about any new moles or changes in moles, especially if there is a change in a mole's shape or color.  Also tell your health care provider if you have a mole that is larger than the size of a pencil eraser.  Always use sunscreen. Apply sunscreen liberally and repeatedly throughout the day.  Protect yourself by wearing long sleeves, pants, a wide-brimmed hat, and sunglasses whenever you are outside. Heart disease, diabetes, and high blood pressure  High blood pressure causes heart disease and increases the risk of stroke.  High blood pressure is more likely to develop in:  People who have blood pressure in the high end of the normal range (130-139/85-89 mm Hg).  People who are overweight or obese.  People who are African American.  If you are 70-34 years of age, have your blood pressure checked every 3-5 years. If you are 73 years of age or older, have your blood pressure checked every year. You should have your blood pressure measured twice-once when you are at a hospital or clinic, and once when you are not at a hospital or clinic. Record the average of the two measurements. To check your blood pressure when you are not at a hospital or clinic, you can use:  An automated blood pressure machine at a pharmacy.  A home blood pressure monitor.  If you are between 85 years and 68 years old, ask your health care provider if you should take aspirin to prevent strokes.  Have regular diabetes screenings. This involves taking a blood sample to  check your fasting blood sugar level.  If you are at a normal weight and have a low risk for diabetes, have this test once every three years after 35 years of age.  If you are overweight and have a high risk for diabetes, consider being tested at a younger age or more often. Preventing infection Hepatitis B  If you have a higher risk for hepatitis B, you should be screened for this virus. You are considered at high risk for hepatitis B if:  You were born in a country where hepatitis B is common. Ask your health care provider which countries are considered high risk.  Your parents were born in a high-risk country, and you have not been immunized against hepatitis B (hepatitis B vaccine).  You have HIV or AIDS.  You use needles to inject street drugs.  You live with someone who has hepatitis B.  You have had sex with someone who has hepatitis B.  You get hemodialysis treatment.  You take certain medicines for conditions, including cancer, organ transplantation, and autoimmune conditions. Hepatitis C  Blood testing is recommended for:  Everyone born from [birth year] through 1964.  Anyone with known risk factors for hepatitis C. Sexually transmitted infections (STIs)  You should be screened for sexually transmitted infections (STIs) including gonorrhea and chlamydia if:  You are sexually active and are younger than 35 years of age.  You are older than 35 years of age and your health care provider tells you that you are at risk for this type of infection.  Your sexual activity has changed since you were last screened and you are at an increased risk for chlamydia or gonorrhea. Ask your health care provider if you are at risk.  If you do not have HIV, but are at risk, it may be recommended that you take a prescription medicine daily to prevent HIV infection. This is called pre-exposure prophylaxis (PrEP). You are considered at risk if:  You are sexually active and do not regularly use  condoms or know the HIV status of your partner(s).  You take drugs by injection.  You are sexually active with a partner who has HIV. Talk with your health care provider about whether you are at high risk of being infected with HIV. If you choose to begin PrEP, you should first be tested for HIV. You should then be tested every 3 months for as long as you are taking PrEP. Pregnancy  If you are premenopausal and  you may become pregnant, ask your health care provider about preconception counseling.  If you may become pregnant, take 400 to 800 micrograms (mcg) of folic acid every day.  If you want to prevent pregnancy, talk to your health care provider about birth control (contraception). Osteoporosis and menopause  Osteoporosis is a disease in which the bones lose minerals and strength with aging. This can result in serious bone fractures. Your risk for osteoporosis can be identified using a bone density scan.  If you are 42 years of age or older, or if you are at risk for osteoporosis and fractures, ask your health care provider if you should be screened.  Ask your health care provider whether you should take a calcium or vitamin D supplement to lower your risk for osteoporosis.  Menopause may have certain physical symptoms and risks.  Hormone replacement therapy may reduce some of these symptoms and risks. Talk to your health care provider about whether hormone replacement therapy is right for you. Follow these instructions at home:  Schedule regular health, dental, and eye exams.  Stay current with your immunizations.  Do not use any tobacco products including cigarettes, chewing tobacco, or electronic cigarettes.  If you are pregnant, do not drink alcohol.  If you are breastfeeding, limit how much and how often you drink alcohol.  Limit alcohol intake to no more than 1 drink per day for nonpregnant women. One drink equals 12 ounces of beer, 5 ounces of wine, or 1 ounces of  hard liquor.  Do not use street drugs.  Do not share needles.  Ask your health care provider for help if you need support or information about quitting drugs.  Tell your health care provider if you often feel depressed.  Tell your health care provider if you have ever been abused or do not feel safe at home. This information is not intended to replace advice given to you by your health care provider. Make sure you discuss any questions you have with your health care provider. Document Released: 10/14/2010 Document Revised: 09/06/2015 Document Reviewed: 01/02/2015 Elsevier Interactive Patient Education  2017 [employer].

## 2016-07-15 NOTE — Assessment & Plan Note (Signed)
Somewhat improved since last week. We have given her a list of psychiatrists to choose from. She is also trying to contact some on her own.

## 2016-07-15 NOTE — Assessment & Plan Note (Signed)
Labs confirm deficiency. Discussed taking medication regularly.

## 2016-07-15 NOTE — Assessment & Plan Note (Signed)
Well controlled. Sees neurology at [facility]. Had a nerve block and no migraines since.

## 2016-07-15 NOTE — Progress Notes (Deleted)
Subjective:    Alisha Hughes is a 36 y.o. female and is here for a comprehensive physical exam.  HPI  There are no preventive care reminders to display for this patient.  Acute Concerns:   Chronic Issues:   Health Maintenance: Immunizations -- *** Colonoscopy -- *** PAP -- *** PSA -- *** Diet -- *** Caffeine intake -- *** Sleep habits -- *** Exercise -- *** Weight -- Weight: 150 lb 6.1 oz (68.2 kg)  Mood -- ***  Depression screen PHQ 2/9 07/15/2016  Decreased Interest 1  Down, Depressed, Hopeless 1  PHQ - 2 Score 2  Altered sleeping 3  Tired, decreased energy 2  Change in appetite 2  Feeling bad or failure about yourself  0  Trouble concentrating 3  Moving slowly or fidgety/restless 2  Suicidal thoughts 0  PHQ-9 Score 14     Other providers/specialists:  PMHx, SurgHx, SocialHx, Medications, and Allergies were reviewed in the Visit Navigator and updated as appropriate.   Past Medical History:  Diagnosis Date  . Abnormal Pap smear   . Anxiety   . Anxiety and depression   . Depression   . Hyperlipidemia   . Kidney stones   . Migraine   . Preeclampsia     Past Surgical History:  Procedure Laterality Date  . CESAREAN SECTION    . CESAREAN SECTION N/A 12/19/2012   Procedure: Repeat cesarean section with delivery of baby girl at [place].    Bilateral tubal ligation with filshie clips.;  Surgeon: Donnamae Jude, MD;  Location: [facility];  Service: Obstetrics;  Laterality: N/A;  . CHOLECYSTECTOMY  2010  . COLONOSCOPY     2009  . COLPOSCOPY VULVA W/ BIOPSY     abnormal pap  . ENDOMETRIAL ABLATION N/A 07/17/2015   Procedure: ENDOMETRIAL ABLATION WITH HTA, DILATATION AND CURRATAGE;  Surgeon: Alisha Filbert, MD;  Location: [facility];  Service: Gynecology;  Laterality: N/A;  . KNEE ARTHROSCOPY    . LAPAROSCOPIC LYSIS OF ADHESIONS  07/17/2015   Procedure: LAPAROSCOPIC LYSIS OF ADHESIONS;  Surgeon: Alisha Filbert, MD;  Location: [facility];  Service: Gynecology;;  . LAPAROSCOPY N/A  07/17/2015   Procedure: LAPAROSCOPY DIAGNOSTIC;  Surgeon: Alisha Filbert, MD;  Location: [facility];  Service: Gynecology;  Laterality: N/A;  . TUBAL LIGATION    . UPPER GI ENDOSCOPY    . WISDOM TOOTH EXTRACTION      Family History  Problem Relation Age of Onset  . Hypertension Mother   . Anuerysm Mother     brain  . Migraines Mother   . Heart disease Father   . Hypertension Father   . Hyperlipidemia Father   . Prostate cancer Father   . Depression Brother   . Multiple sclerosis Maternal Grandmother   . Sudden death Maternal Grandmother   . Heart disease Maternal Grandfather   . Sudden death Maternal Grandfather   . Heart disease Paternal Grandmother   . Diabetes Paternal Grandmother   . Parkinson's disease Paternal Grandfather     Social History  Substance Use Topics  . Smoking status: Never Smoker  . Smokeless tobacco: Never Used  . Alcohol use 0.0 oz/week     Comment: rarely    Review of Systems:   ROS  Objective:   Vitals:   07/15/16 0939 07/15/16 1025  BP: 132/90 120/88  Pulse: 76   Temp: 98.3 F (36.8 C)    Body mass index is 27.5 kg/m.  General Appearance:  Alert, cooperative,  no distress, appears stated age  Head:  Normocephalic, without obvious abnormality, atraumatic  Eyes:  PERRL, conjunctiva/corneas clear, EOM's intact, fundi benign, both eyes       Ears:  Normal TM's and external ear canals, both ears  Nose: Nares normal, septum midline, mucosa normal, no drainage    or sinus tenderness  Throat: Lips, mucosa, and tongue normal; teeth and gums normal  Neck: Supple, symmetrical, trachea midline, no adenopathy; thyroid:  No enlargement/tenderness/nodules; no carotit bruit or JVD  Back:   Symmetric, no curvature, ROM normal, no CVA tenderness  Lungs:   Clear to auscultation bilaterally, respirations unlabored  Chest wall:  No tenderness or deformity  Heart:  Regular rate and rhythm, S1 and S2 normal, no murmur, rub   or gallop  Abdomen:   Soft,  non-tender, bowel sounds active all four quadrants, no masses, no organomegaly  Extremities: Extremities normal, atraumatic, no cyanosis or edema  Prostate: Not done.   Skin: Skin color, texture, turgor normal, no rashes or lesions  Lymph nodes: Cervical, supraclavicular, and axillary nodes normal  Neurologic: CNII-XII grossly intact. Normal strength, sensation and reflexes throughout    Assessment/Plan:   Alisha Hughes was seen today for annual exam.  Diagnoses and all orders for this visit:  Dysuria -     POCT urinalysis dipstick  Chest tightness or pressure -     EKG 12-Lead  Encounter for general adult medical examination with abnormal findings -     Lipid panel    Well Adult Exam: Labs ordered: {Yes/No:18319::"Yes"}. Patient counseling was done. See below for items discussed. Discussed the patient's BMI.  The BMI {BMI plan (MU NQF measure 421):19504} Follow up {follow-up interval:13814}.  Patient Counseling: [x]   Nutrition: Stressed importance of moderation in sodium/caffeine intake, saturated fat and cholesterol, caloric balance, sufficient intake of fresh fruits, vegetables, and fiber.  [x]   Stressed the importance of regular exercise.   []   Substance Abuse: Discussed cessation/primary prevention of tobacco, alcohol, or other drug use; driving or other dangerous activities under the influence; availability of treatment for abuse.   [x]   Injury prevention: Discussed safety belts, safety helmets, smoke detector, smoking near bedding or upholstery.   []   Sexuality: Discussed sexually transmitted diseases, partner selection, use of condoms, avoidance of unintended pregnancy  and contraceptive alternatives.   [x]   Dental health: Discussed importance of regular tooth brushing, flossing, and dental visits.  [x]   Health maintenance and immunizations reviewed. Please refer to Health maintenance section.     Alisha Coke, PA-C [place]

## 2016-07-15 NOTE — Assessment & Plan Note (Signed)
Well controlled. Norvasc 5 mg. Baseline EKG down today, NSR.

## 2016-07-17 LAB — URINE CULTURE

## 2016-07-31 ENCOUNTER — Ambulatory Visit: Payer: 59 | Admitting: Obstetrics and Gynecology

## 2016-08-06 ENCOUNTER — Encounter: Payer: Self-pay | Admitting: Physician Assistant

## 2016-08-08 DIAGNOSIS — J019 Acute sinusitis, unspecified: Secondary | ICD-10-CM | POA: Diagnosis not present

## 2016-08-14 ENCOUNTER — Ambulatory Visit: Payer: 59 | Admitting: Obstetrics and Gynecology

## 2016-08-28 ENCOUNTER — Encounter: Payer: Self-pay | Admitting: Obstetrics & Gynecology

## 2016-08-28 ENCOUNTER — Ambulatory Visit (INDEPENDENT_AMBULATORY_CARE_PROVIDER_SITE_OTHER): Payer: 59 | Admitting: Obstetrics & Gynecology

## 2016-08-28 VITALS — BP 129/93 | HR 71 | Resp 18 | Ht 63.0 in | Wt 149.0 lb

## 2016-08-28 DIAGNOSIS — Z124 Encounter for screening for malignant neoplasm of cervix: Secondary | ICD-10-CM

## 2016-08-28 DIAGNOSIS — Z1151 Encounter for screening for human papillomavirus (HPV): Secondary | ICD-10-CM

## 2016-08-28 DIAGNOSIS — Z01419 Encounter for gynecological examination (general) (routine) without abnormal findings: Secondary | ICD-10-CM | POA: Diagnosis not present

## 2016-08-28 MED ORDER — DROSPIRENONE-ETHINYL ESTRADIOL 3-0.02 MG PO TABS: 1.0000 | ORAL_TABLET | Freq: Every day | ORAL | 11 refills | Status: DC
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
## 2016-09-02 LAB — CYTOLOGY - PAP
DIAGNOSIS: NEGATIVE
HPV (WINDOPATH): NOT DETECTED

## 2016-10-09 ENCOUNTER — Ambulatory Visit (INDEPENDENT_AMBULATORY_CARE_PROVIDER_SITE_OTHER): Payer: 59 | Admitting: Physician Assistant

## 2016-10-09 ENCOUNTER — Ambulatory Visit (INDEPENDENT_AMBULATORY_CARE_PROVIDER_SITE_OTHER): Payer: 59

## 2016-10-09 ENCOUNTER — Ambulatory Visit: Payer: 59

## 2016-10-09 ENCOUNTER — Encounter: Payer: Self-pay | Admitting: Physician Assistant

## 2016-10-09 ENCOUNTER — Ambulatory Visit
Admission: RE | Admit: 2016-10-09 | Discharge: 2016-10-09 | Disposition: A | Discharge status: Home / Self Care | Payer: 59 | Source: Ambulatory Visit | Attending: Physician Assistant | Admitting: Physician Assistant

## 2016-10-09 VITALS — BP 138/90 | HR 72 | Temp 98.2°F | Ht 63.0 in | Wt 153.0 lb

## 2016-10-09 DIAGNOSIS — R071 Chest pain on breathing: Secondary | ICD-10-CM

## 2016-10-09 DIAGNOSIS — M542 Cervicalgia: Secondary | ICD-10-CM

## 2016-10-09 DIAGNOSIS — R51 Headache: Secondary | ICD-10-CM | POA: Insufficient documentation

## 2016-10-09 DIAGNOSIS — R079 Chest pain, unspecified: Secondary | ICD-10-CM | POA: Diagnosis not present

## 2016-10-09 DIAGNOSIS — S299XXA Unspecified injury of thorax, initial encounter: Secondary | ICD-10-CM | POA: Diagnosis not present

## 2016-10-09 DIAGNOSIS — R519 Headache, unspecified: Secondary | ICD-10-CM

## 2016-10-09 DIAGNOSIS — S199XXA Unspecified injury of neck, initial encounter: Secondary | ICD-10-CM | POA: Diagnosis not present

## 2016-10-09 MED ORDER — HYDROCODONE-ACETAMINOPHEN 5-325 MG PO TABS: 1.0000 | ORAL_TABLET | Freq: Four times a day (QID) | ORAL | 0 refills | Status: DC | PRN

## 2016-10-09 MED ORDER — KETOROLAC TROMETHAMINE 60 MG/2ML IM SOLN
60.0000 mg | Freq: Once | INTRAMUSCULAR | Status: AC
  Administered 2016-10-09: 60 mg via INTRAMUSCULAR
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
## 2016-10-09 NOTE — Patient Instructions (Signed)
Take the pain medication as prescribed.  If you develop sudden onset shortness of breath or worsening chest pain -- please go to the emergency room.  If you develop worsening headache, changes in vision or you pass out -- please go to the emergency room.  If your pain persists after treatment with pain medication, please follow-up with Dr. Teresa Coombs in sports medicine for further evaluation.

## 2016-10-14 ENCOUNTER — Encounter: Payer: Self-pay | Admitting: Physician Assistant

## 2016-10-14 NOTE — Telephone Encounter (Signed)
Please see message and advise 

## 2016-10-17 ENCOUNTER — Ambulatory Visit (INDEPENDENT_AMBULATORY_CARE_PROVIDER_SITE_OTHER): Payer: 59 | Admitting: *Deleted

## 2016-10-17 VITALS — BP 129/87 | HR 72

## 2016-10-17 DIAGNOSIS — Z0130 Encounter for examination of blood pressure without abnormal findings: Secondary | ICD-10-CM

## 2016-10-17 NOTE — Progress Notes (Signed)
Pt is here today for BP check after initiating OCPs.  BP = 129/87, pt BP is currently being monitored by her PCP and she is taking Norvasc 5 mg daily.  Pt reports no problems with the OCPs at this time and will continue her current medication regimen.  Instructed to call back with any concerns.

## 2016-12-21 DIAGNOSIS — J069 Acute upper respiratory infection, unspecified: Secondary | ICD-10-CM | POA: Diagnosis not present

## 2016-12-22 DIAGNOSIS — J019 Acute sinusitis, unspecified: Secondary | ICD-10-CM | POA: Diagnosis not present

## 2016-12-31 ENCOUNTER — Ambulatory Visit
Admission: RE | Admit: 2016-12-31 | Discharge: 2016-12-31 | Disposition: A | Discharge status: Home / Self Care | Payer: 59 | Source: Ambulatory Visit | Attending: Obstetrics & Gynecology | Admitting: Obstetrics & Gynecology

## 2016-12-31 ENCOUNTER — Ambulatory Visit (INDEPENDENT_AMBULATORY_CARE_PROVIDER_SITE_OTHER): Payer: 59 | Admitting: Obstetrics & Gynecology

## 2016-12-31 ENCOUNTER — Encounter: Payer: Self-pay | Admitting: Obstetrics & Gynecology

## 2016-12-31 VITALS — BP 141/98 | HR 75 | Ht 63.0 in | Wt 154.0 lb

## 2016-12-31 DIAGNOSIS — Z23 Encounter for immunization: Secondary | ICD-10-CM | POA: Diagnosis not present

## 2016-12-31 DIAGNOSIS — R102 Pelvic and perineal pain: Secondary | ICD-10-CM | POA: Insufficient documentation

## 2016-12-31 DIAGNOSIS — Z0000 Encounter for general adult medical examination without abnormal findings: Secondary | ICD-10-CM

## 2016-12-31 MED ORDER — IBUPROFEN 800 MG PO TABS: 800.0000 mg | ORAL_TABLET | Freq: Three times a day (TID) | ORAL | 1 refills | Status: DC | PRN

## 2016-12-31 NOTE — Progress Notes (Signed)
Left ovary pain - nauseated. Pain scale 9/10

## 2016-12-31 NOTE — Progress Notes (Signed)
   Subjective:    Patient ID: Alisha Hughes, female    DOB: 1981-03-12, 36 y.o.   MRN: [id number]  HPI 36 yo MW P2 (s/p BTL) here with severe pelvic pain for 1 day, c/w her previous ovarian cyst pain. She tried to have sex yesterday on her anniversary but there was too much pain   Review of Systems I prescribed her OCPs to help prevent ovarian cysts, but she had to stop is due to elevated BPs    Objective:   Physical Exam Breathing, conversing, and ambulating normally Well nourished, well hydrated white female, no apparent distress Abd- benign, non- surgical Bimanual exam reveals a small painful cyst on her left.     Assessment & Plan:  Preventative care- flu vaccine today Ovarian cyst pain- IBU 800 prescribed Order gyn u/s

## 2017-01-01 ENCOUNTER — Encounter: Payer: Self-pay | Admitting: Obstetrics & Gynecology

## 2017-01-13 ENCOUNTER — Emergency Department: Payer: 59

## 2017-01-13 ENCOUNTER — Encounter: Payer: Self-pay | Admitting: Emergency Medicine

## 2017-01-13 ENCOUNTER — Emergency Department
Admission: EM | Admit: 2017-01-13 | Discharge: 2017-01-14 | Disposition: A | Discharge status: Home / Self Care | Payer: 59 | Attending: Emergency Medicine | Admitting: Emergency Medicine

## 2017-01-13 DIAGNOSIS — R0602 Shortness of breath: Secondary | ICD-10-CM | POA: Diagnosis not present

## 2017-01-13 DIAGNOSIS — F419 Anxiety disorder, unspecified: Secondary | ICD-10-CM | POA: Diagnosis not present

## 2017-01-13 DIAGNOSIS — Z91040 Latex allergy status: Secondary | ICD-10-CM | POA: Insufficient documentation

## 2017-01-13 DIAGNOSIS — F329 Major depressive disorder, single episode, unspecified: Secondary | ICD-10-CM | POA: Insufficient documentation

## 2017-01-13 DIAGNOSIS — I159 Secondary hypertension, unspecified: Secondary | ICD-10-CM

## 2017-01-13 DIAGNOSIS — I10 Essential (primary) hypertension: Secondary | ICD-10-CM | POA: Insufficient documentation

## 2017-01-13 DIAGNOSIS — Z9049 Acquired absence of other specified parts of digestive tract: Secondary | ICD-10-CM | POA: Insufficient documentation

## 2017-01-13 DIAGNOSIS — R079 Chest pain, unspecified: Secondary | ICD-10-CM | POA: Diagnosis not present

## 2017-01-13 DIAGNOSIS — G43809 Other migraine, not intractable, without status migrainosus: Secondary | ICD-10-CM | POA: Diagnosis not present

## 2017-01-13 DIAGNOSIS — Z79899 Other long term (current) drug therapy: Secondary | ICD-10-CM | POA: Insufficient documentation

## 2017-01-13 DIAGNOSIS — R0789 Other chest pain: Secondary | ICD-10-CM | POA: Diagnosis not present

## 2017-01-13 DIAGNOSIS — R51 Headache: Secondary | ICD-10-CM | POA: Diagnosis present

## 2017-01-13 HISTORY — DX: Essential (primary) hypertension: I10

## 2017-01-13 LAB — BASIC METABOLIC PANEL
ANION GAP: 6 (ref 5–15)
BUN: 9 mg/dL (ref 6–20)
CHLORIDE: 106 mmol/L (ref 101–111)
CO2: 27 mmol/L (ref 22–32)
Calcium: 9 mg/dL (ref 8.9–10.3)
Creatinine, Ser: 0.79 mg/dL (ref 0.44–1.00)
GFR calc Af Amer: >60 mL/min (ref >60)
GFR calc non Af Amer: >60 mL/min (ref >60)
GLUCOSE: 109 mg/dL — AB (ref 65–99)
POTASSIUM: 3.2 mmol/L — AB (ref 3.5–5.1)
Sodium: 139 mmol/L (ref 135–145)

## 2017-01-13 LAB — CBC
HEMATOCRIT: 43 % (ref 35.0–47.0)
HEMOGLOBIN: 14.4 g/dL (ref 12.0–16.0)
MCH: 27.8 pg (ref 26.0–34.0)
MCHC: 33.5 g/dL (ref 32.0–36.0)
MCV: 82.8 fL (ref 80.0–100.0)
Platelets: 292 10*3/uL (ref 150–440)
RBC: 5.2 MIL/uL (ref 3.80–5.20)
RDW: 13.3 % (ref 11.5–14.5)
WBC: 8.1 10*3/uL (ref 3.6–11.0)

## 2017-01-13 LAB — TROPONIN I: Troponin I: <0.03 ng/mL (ref <0.03)

## 2017-01-13 NOTE — ED Notes (Signed)
Family at bedside. 

## 2017-01-13 NOTE — ED Triage Notes (Signed)
Patient states that she developed blurred vision around 12:30. Then started developing a headache around 15:00. Patient states that around 18:00 she developed chest tightness and some shortness of breath. Patient states that she checked her blood pressure at home and it 167/112. Patient states that she took tylenol for the headache and it helped for a little while but the headache came back. Patient states that she has a history of migraines.

## 2017-01-13 NOTE — ED Notes (Signed)
ED Provider at bedside. 

## 2017-01-13 NOTE — ED Provider Notes (Signed)
[facility] Emergency Department Provider Note   ____________________________________________   First MD Initiated Contact with Patient 01/13/17 2340     (approximate)  I have reviewed the triage vital signs and the nursing notes.   HISTORY  Chief Complaint Chest Pain; Jaw Pain; Headache; and Blurred Vision    HPI Alisha Hughes is a 36 y.o. female who presents to the ED from home with a chief complaint of headache, chest pain and elevated blood pressure. Patient has a history of frequent migraine headaches. States she developed blurry vision around 12:30 PM. Developed headache around 3 PM she states is different from her normal migraine headaches. Around 6 PM she developed onset of chest tightness, shortness of breath with radiation to her jaw. Her neighbor is a Marine scientist and took her blood pressure which was 167/112. States her baseline BP is 140/90. Patient took an Imitrex as well as Tylenol for the headache which partially relieved her symptoms but headache came back. Associated with nausea. Denies associated diaphoresis, vomiting, dizziness, palpitations, abdominal pain, diarrhea. Denies recent travel or trauma. Denies recent increased stress at work or home.   Past Medical History:  Diagnosis Date  . Anxiety   . Anxiety and depression   . Depression   . Hyperlipidemia   . Hypertension   . Migraines     Patient Active Problem List   Diagnosis Date Noted  . Irritable bowel syndrome with diarrhea 07/15/2016  . Vitamin D deficiency 07/15/2016  . Anxiety and depression 02/11/2016  . Essential hypertension 02/08/2013  . Chronic migraine without aura 12/07/2012    Past Surgical History:  Procedure Laterality Date  . CESAREAN SECTION    . CESAREAN SECTION N/A 12/19/2012   Procedure: Repeat cesarean section with delivery of baby girl at [facility].    Bilateral tubal ligation with filshie clips.;  Surgeon: Donnamae Jude, MD;  Location: [facility];  Service:  Obstetrics;  Laterality: N/A;  . CHOLECYSTECTOMY  2010  . COLONOSCOPY     2009  . COLPOSCOPY VULVA W/ BIOPSY     abnormal pap  . ENDOMETRIAL ABLATION N/A 07/17/2015   Procedure: ENDOMETRIAL ABLATION WITH HTA, DILATATION AND CURRATAGE;  Surgeon: Emily Filbert, MD;  Location: [facility];  Service: Gynecology;  Laterality: N/A;  . KNEE ARTHROSCOPY    . LAPAROSCOPIC LYSIS OF ADHESIONS  07/17/2015   Procedure: LAPAROSCOPIC LYSIS OF ADHESIONS;  Surgeon: Emily Filbert, MD;  Location: [facility];  Service: Gynecology;;  . LAPAROSCOPY N/A 07/17/2015   Procedure: LAPAROSCOPY DIAGNOSTIC;  Surgeon: Emily Filbert, MD;  Location: [facility];  Service: Gynecology;  Laterality: N/A;  . TUBAL LIGATION    . UPPER GI ENDOSCOPY    . WISDOM TOOTH EXTRACTION      Prior to Admission medications   Medication Sig Start Date End Date Taking? Authorizing Provider  amLODipine (NORVASC) 5 MG tablet Take 1 tablet (5 mg total) by mouth daily. Will need follow up on her BP. 05/13/16   Coral Spikes, DO  HYDROcodone-acetaminophen (NORCO/VICODIN) 5-325 MG tablet Take 1 tablet by mouth every 6 (six) hours as needed for moderate pain. 10/09/16   Inda Coke, PA  ibuprofen (ADVIL,MOTRIN) 800 MG tablet Take 1 tablet (800 mg total) by mouth every 8 (eight) hours as needed. 12/31/16   Emily Filbert, MD  SUMAtriptan (IMITREX) 100 MG tablet sumatriptan 100 mg tablet    [provider]  topiramate (TOPAMAX) 200 MG tablet Take 1 tablet (200 mg total) by  mouth daily. 02/19/16   Paticia Stack, PA-C    Allergies Latex; Reglan [metoclopramide]; and Sertraline  Family History  Problem Relation Age of Onset  . Hypertension Mother   . Anuerysm Mother        brain  . Migraines Mother   . Heart disease Father   . Hypertension Father   . Hyperlipidemia Father   . Prostate cancer Father   . Depression Brother   . Multiple sclerosis Maternal Grandmother   . Sudden death Maternal Grandmother   . Heart disease Maternal Grandfather   .  Sudden death Maternal Grandfather   . Heart disease Paternal Grandmother   . Diabetes Paternal Grandmother   . Parkinson's disease Paternal Grandfather     Social History Social History  Substance Use Topics  . Smoking status: Never Smoker  . Smokeless tobacco: Never Used  . Alcohol use 0.0 oz/week     Comment: rarely    Review of Systems  Constitutional: No fever/chills. Eyes: positive for blurry vision. no recent change in glasses prescription. ENT: No sore throat. Cardiovascular: positive for chest pain. Respiratory: positive for shortness of breath. Gastrointestinal: No abdominal pain.  positive for nausea, no vomiting.  No diarrhea.  No constipation. Genitourinary: Negative for dysuria. Musculoskeletal: Negative for back pain. Skin: Negative for rash. Neurological: positive for headache. negative for focal weakness or numbness.   ____________________________________________   PHYSICAL EXAM:  VITAL SIGNS: ED Triage Vitals  Enc Vitals Group     BP 01/13/17 1937 (!) 174/101     Pulse Rate 01/13/17 1937 81     Resp 01/13/17 1937 18     Temp 01/13/17 1937 97.6 F (36.4 C)     Temp Source 01/13/17 1937 Oral     SpO2 01/13/17 1937 100 %     Weight 01/13/17 1939 154 lb (69.9 kg)     Height 01/13/17 1939 5\' 3"  (1.6 m)     Head Circumference --      Peak Flow --      Pain Score 01/13/17 1938 7     Pain Loc --      Pain Edu? --      Excl. in [place]? --     Constitutional: Alert and oriented. Well appearing and in mild acute distress. Eyes: Conjunctivae are normal. PERRL. EOMI. Head: Atraumatic. Nose: No congestion/rhinnorhea. Mouth/Throat: Mucous membranes are moist.  Oropharynx non-erythematous. Neck: No stridor.  No cervical spine tenderness to palpation.  Supple neck without meningismus.  No carotid bruits. Cardiovascular: Normal rate, regular rhythm. Grossly normal heart sounds.  Good peripheral circulation. Respiratory: Normal respiratory effort.  No  retractions. Lungs CTAB. Gastrointestinal: Soft and nontender. No distention. No abdominal bruits. No CVA tenderness. Musculoskeletal: No lower extremity tenderness nor edema.  No joint effusions. Neurologic:  Alert and oriented 3. CN II-XII grossly intact. Normal speech and language. No gross focal neurologic deficits are appreciated. No gait instability. Skin:  Skin is warm, dry and intact. No rash noted. No petechiae. Psychiatric: Mood and affect are normal. Speech and behavior are normal.  ____________________________________________   LABS (all labs ordered are listed, but only abnormal results are displayed)  Labs Reviewed  BASIC METABOLIC PANEL - Abnormal; Notable for the following:       Result Value   Potassium 3.2 (*)    Glucose, Bld 109 (*)    All other components within normal limits  CBC  TROPONIN I  TROPONIN I   ____________________________________________  EKG  ED ECG  REPORT I, Costa Jha J, the attending physician, personally viewed and interpreted this ECG.   Date: 01/14/2017  EKG Time: 1942  Rate: 70  Rhythm: normal EKG, normal sinus rhythm  Axis: normal  Intervals:none  ST&T Change: nonspecific  ____________________________________________  RADIOLOGY  Dg Chest 2 View  Result Date: 01/13/2017 CLINICAL DATA:  Blurred vision.  Headache. EXAM: CHEST  2 VIEW COMPARISON:  10/09/2016 FINDINGS: Normal cardiac silhouette and mediastinal contours. No focal parenchymal opacities. Linear heterogeneous opacities within with the peripheral aspect of the right mid lung is unchanged and favored to represent atelectasis or scar. No pleural effusion or pneumothorax. No evidence of edema. No acute osseus abnormalities. IMPRESSION: No acute cardiopulmonary disease. Electronically Signed   By: Sandi Mariscal M.D.   On: 01/13/2017 20:02    ____________________________________________   PROCEDURES  Procedure(s) performed: None  Procedures  Critical Care performed:  No  ____________________________________________   INITIAL IMPRESSION / ASSESSMENT AND PLAN / ED COURSE  Pertinent labs & imaging results that were available during my care of the patient were reviewed by me and considered in my medical decision making (see chart for details).  36 year old female with history of migraine headaches as well as hypertension who presents with headache, elevated blood pressure, chest pain. Differential diagnosis includes, but is not limited to, ACS, aortic dissection, pulmonary embolism, cardiac tamponade, pneumothorax, pneumonia, pericarditis/myocarditis, GI-related causes including esophagitis/gastritis, and musculoskeletal chest wall pain.  Will administer IV headache cocktail of Compazine and Benadryl. Initial EKG and troponin are unremarkable. Will repeat times troponin. Mother has a history of cerebral aneurysm; patient was last checked for aneurysm 3 years ago. Will obtain CT angiogram head and chest; will reassess. Neurologically, patient is well-appearing with no focal deficits. Neck is supple without signs of meningismus.  ----------------------------------------- 12:14 AM on 01/14/2017 -----------------------------------------   OBSERVATION CARE: This patient is being placed under observation care for the following reasons: Chest pain with repeat testing to rule out ischemia   Clinical Course as of Jan 14 141  Wed Jan 14, 2017  0116 Patient is feeling much better. Blood pressures improved. Updated patient and her father of repeat negative troponin and negative CT imaging studies. Will increase amlodipine to 10 mg daily and patient will keep a blood pressure log to take to her doctor. She will follow up closely with her PCP this week. Strict return precautions given. Both verbalize understanding and agree with plan of care.  [JS]    Clinical Course User Index [JS] Paulette Blanch, MD    ----------------------------------------- 1:22 AM on  01/14/2017 -----------------------------------------   END OF OBSERVATION STATUS: After an appropriate period of observation, this patient is being discharged due to the following reason(s):  improved symptoms, blood pressure and negative testing. ____________________________________________   FINAL CLINICAL IMPRESSION(S) / ED DIAGNOSES  Final diagnoses:  Nonspecific chest pain  Secondary hypertension  Other migraine without status migrainosus, not intractable      NEW MEDICATIONS STARTED DURING THIS VISIT:  New Prescriptions   No medications on file     Note:  This document was prepared using Dragon voice recognition software and may include unintentional dictation errors.    Paulette Blanch, MD 01/14/17 [phone]

## 2017-01-14 ENCOUNTER — Emergency Department: Payer: 59

## 2017-01-14 ENCOUNTER — Encounter: Payer: Self-pay | Admitting: Radiology

## 2017-01-14 DIAGNOSIS — R51 Headache: Secondary | ICD-10-CM | POA: Diagnosis not present

## 2017-01-14 DIAGNOSIS — R0789 Other chest pain: Secondary | ICD-10-CM | POA: Diagnosis not present

## 2017-01-14 DIAGNOSIS — I159 Secondary hypertension, unspecified: Secondary | ICD-10-CM | POA: Diagnosis not present

## 2017-01-14 DIAGNOSIS — R0602 Shortness of breath: Secondary | ICD-10-CM | POA: Diagnosis not present

## 2017-01-14 DIAGNOSIS — G43809 Other migraine, not intractable, without status migrainosus: Secondary | ICD-10-CM | POA: Diagnosis not present

## 2017-01-14 LAB — TROPONIN I: Troponin I: <0.03 ng/mL (ref <0.03)

## 2017-01-14 MED ORDER — DEXTROSE-NACL 5-0.45 % IV SOLN
INTRAVENOUS | Status: DC
Start: 2017-01-14 — End: 2017-01-14
  Administered 2017-01-14: via INTRAVENOUS

## 2017-01-14 MED ORDER — DIPHENHYDRAMINE HCL 50 MG/ML IJ SOLN
25.0000 mg | Freq: Once | INTRAMUSCULAR | Status: AC
  Administered 2017-01-14: 25 mg via INTRAVENOUS

## 2017-01-14 MED ORDER — PROCHLORPERAZINE EDISYLATE 5 MG/ML IJ SOLN
INTRAMUSCULAR | Status: AC
  Administered 2017-01-14: 2.5 mg via INTRAVENOUS
  Filled 2017-01-14: qty 2

## 2017-01-14 MED ORDER — IOPAMIDOL (ISOVUE-370) INJECTION 76%
100.0000 mL | Freq: Once | INTRAVENOUS | Status: AC | PRN
  Administered 2017-01-14: 100 mL via INTRAVENOUS

## 2017-01-14 MED ORDER — PROCHLORPERAZINE MALEATE 10 MG PO TABS: 10.0000 mg | ORAL_TABLET | Freq: Four times a day (QID) | ORAL | 0 refills | Status: DC | PRN

## 2017-01-14 MED ORDER — AMLODIPINE BESYLATE 10 MG PO TABS: 10.0000 mg | ORAL_TABLET | Freq: Every day | ORAL | 0 refills | Status: DC

## 2017-01-14 MED ORDER — PROCHLORPERAZINE EDISYLATE 5 MG/ML IJ SOLN
2.5000 mg | Freq: Once | INTRAMUSCULAR | Status: AC
  Administered 2017-01-14: 2.5 mg via INTRAVENOUS

## 2017-01-14 MED ORDER — DIPHENHYDRAMINE HCL 50 MG/ML IJ SOLN
INTRAMUSCULAR | Status: AC
  Administered 2017-01-14: 25 mg via INTRAVENOUS
  Filled 2017-01-14: qty 1

## 2017-01-14 NOTE — ED Notes (Signed)
Pt unhooked from machine and up to the bedside commode.

## 2017-01-14 NOTE — Discharge Instructions (Signed)
1. Start amlodipine 10 mg tablet daily. 2. Keep a log of your blood pressure readings and take these to your doctor. 3. You may take Compazine as needed for headache and nausea. 4. Return to the ER for worsening symptoms, persistent vomiting, difficulty breathing, lethargy or other concerns.

## 2017-01-14 NOTE — ED Notes (Signed)
Patient is resting comfortably. 

## 2017-01-14 NOTE — ED Notes (Signed)
Patient transported to CT 

## 2017-01-15 DIAGNOSIS — J0191 Acute recurrent sinusitis, unspecified: Secondary | ICD-10-CM | POA: Diagnosis not present

## 2017-02-03 DIAGNOSIS — J309 Allergic rhinitis, unspecified: Secondary | ICD-10-CM | POA: Diagnosis not present

## 2017-02-03 DIAGNOSIS — R51 Headache: Secondary | ICD-10-CM | POA: Diagnosis not present

## 2017-03-16 DIAGNOSIS — L0201 Cutaneous abscess of face: Secondary | ICD-10-CM | POA: Diagnosis not present

## 2017-04-29 DIAGNOSIS — L700 Acne vulgaris: Secondary | ICD-10-CM | POA: Diagnosis not present

## 2017-04-29 DIAGNOSIS — L218 Other seborrheic dermatitis: Secondary | ICD-10-CM | POA: Diagnosis not present

## 2017-05-16 DIAGNOSIS — Z79899 Other long term (current) drug therapy: Secondary | ICD-10-CM | POA: Diagnosis not present

## 2017-05-16 DIAGNOSIS — L089 Local infection of the skin and subcutaneous tissue, unspecified: Secondary | ICD-10-CM | POA: Diagnosis not present

## 2017-05-16 DIAGNOSIS — I10 Essential (primary) hypertension: Secondary | ICD-10-CM | POA: Diagnosis not present

## 2017-05-16 DIAGNOSIS — R220 Localized swelling, mass and lump, head: Secondary | ICD-10-CM | POA: Diagnosis not present

## 2017-05-16 DIAGNOSIS — L03211 Cellulitis of face: Secondary | ICD-10-CM | POA: Diagnosis not present

## 2017-05-16 DIAGNOSIS — R51 Headache: Secondary | ICD-10-CM | POA: Diagnosis not present

## 2017-05-20 DIAGNOSIS — L700 Acne vulgaris: Secondary | ICD-10-CM | POA: Diagnosis not present

## 2017-05-20 DIAGNOSIS — L0201 Cutaneous abscess of face: Secondary | ICD-10-CM | POA: Diagnosis not present

## 2017-05-21 DIAGNOSIS — R21 Rash and other nonspecific skin eruption: Secondary | ICD-10-CM | POA: Diagnosis not present

## 2017-05-27 ENCOUNTER — Other Ambulatory Visit: Payer: Self-pay | Admitting: Internal Medicine

## 2017-05-27 DIAGNOSIS — L219 Seborrheic dermatitis, unspecified: Secondary | ICD-10-CM | POA: Diagnosis not present

## 2017-05-27 DIAGNOSIS — L738 Other specified follicular disorders: Secondary | ICD-10-CM | POA: Diagnosis not present

## 2017-05-27 DIAGNOSIS — D229 Melanocytic nevi, unspecified: Secondary | ICD-10-CM | POA: Diagnosis not present

## 2017-05-27 DIAGNOSIS — L0201 Cutaneous abscess of face: Secondary | ICD-10-CM | POA: Diagnosis not present

## 2017-05-29 ENCOUNTER — Other Ambulatory Visit: Payer: Self-pay | Admitting: Dermatology

## 2017-05-29 DIAGNOSIS — L989 Disorder of the skin and subcutaneous tissue, unspecified: Secondary | ICD-10-CM

## 2017-06-05 ENCOUNTER — Ambulatory Visit: Payer: 59

## 2017-06-09 ENCOUNTER — Ambulatory Visit
Admission: RE | Admit: 2017-06-09 | Discharge: 2017-06-09 | Disposition: A | Discharge status: Home / Self Care | Payer: 59 | Source: Ambulatory Visit | Attending: Dermatology | Admitting: Dermatology

## 2017-06-09 DIAGNOSIS — L989 Disorder of the skin and subcutaneous tissue, unspecified: Secondary | ICD-10-CM

## 2017-06-09 DIAGNOSIS — L0291 Cutaneous abscess, unspecified: Secondary | ICD-10-CM | POA: Diagnosis not present

## 2017-06-09 DIAGNOSIS — L0201 Cutaneous abscess of face: Secondary | ICD-10-CM | POA: Diagnosis not present

## 2017-06-09 DIAGNOSIS — R220 Localized swelling, mass and lump, head: Secondary | ICD-10-CM | POA: Diagnosis not present

## 2017-06-09 NOTE — Procedures (Signed)
US guided FNA of superficial collection on forehead.  Few drops of bloody thick fluid was aspirated.  Minimal blood loss and no immediate complication.

## 2017-06-12 LAB — BODY FLUID CULTURE: CULTURE: NO GROWTH

## 2017-06-17 DIAGNOSIS — D485 Neoplasm of uncertain behavior of skin: Secondary | ICD-10-CM | POA: Diagnosis not present

## 2017-06-17 DIAGNOSIS — L0201 Cutaneous abscess of face: Secondary | ICD-10-CM | POA: Diagnosis not present

## 2017-06-17 DIAGNOSIS — L308 Other specified dermatitis: Secondary | ICD-10-CM | POA: Diagnosis not present

## 2017-06-17 DIAGNOSIS — L0291 Cutaneous abscess, unspecified: Secondary | ICD-10-CM | POA: Diagnosis not present

## 2017-06-19 ENCOUNTER — Other Ambulatory Visit: Payer: Self-pay | Admitting: Family Medicine

## 2017-06-19 DIAGNOSIS — I10 Essential (primary) hypertension: Secondary | ICD-10-CM

## 2017-06-21 ENCOUNTER — Other Ambulatory Visit: Payer: Self-pay | Admitting: Family Medicine

## 2017-06-21 DIAGNOSIS — G43709 Chronic migraine without aura, not intractable, without status migrainosus: Secondary | ICD-10-CM

## 2017-06-24 DIAGNOSIS — D485 Neoplasm of uncertain behavior of skin: Secondary | ICD-10-CM | POA: Diagnosis not present

## 2017-06-24 DIAGNOSIS — B359 Dermatophytosis, unspecified: Secondary | ICD-10-CM | POA: Diagnosis not present

## 2017-06-24 DIAGNOSIS — L0291 Cutaneous abscess, unspecified: Secondary | ICD-10-CM | POA: Diagnosis not present

## 2017-06-29 ENCOUNTER — Encounter: Payer: Self-pay | Admitting: Radiology

## 2017-07-01 DIAGNOSIS — L0291 Cutaneous abscess, unspecified: Secondary | ICD-10-CM | POA: Diagnosis not present

## 2017-08-12 ENCOUNTER — Other Ambulatory Visit: Payer: Self-pay | Admitting: Physician Assistant

## 2017-08-12 ENCOUNTER — Encounter: Payer: Self-pay | Admitting: Physician Assistant

## 2017-08-12 ENCOUNTER — Ambulatory Visit (INDEPENDENT_AMBULATORY_CARE_PROVIDER_SITE_OTHER): Payer: 59 | Admitting: Physician Assistant

## 2017-08-12 VITALS — BP 132/98 | HR 63 | Temp 97.9°F | Ht 63.0 in | Wt 155.0 lb

## 2017-08-12 DIAGNOSIS — I10 Essential (primary) hypertension: Secondary | ICD-10-CM | POA: Diagnosis not present

## 2017-08-12 DIAGNOSIS — Z13220 Encounter for screening for lipoid disorders: Secondary | ICD-10-CM

## 2017-08-12 DIAGNOSIS — E559 Vitamin D deficiency, unspecified: Secondary | ICD-10-CM | POA: Diagnosis not present

## 2017-08-12 DIAGNOSIS — Z0001 Encounter for general adult medical examination with abnormal findings: Secondary | ICD-10-CM | POA: Diagnosis not present

## 2017-08-12 DIAGNOSIS — G43709 Chronic migraine without aura, not intractable, without status migrainosus: Secondary | ICD-10-CM | POA: Diagnosis not present

## 2017-08-12 DIAGNOSIS — Z136 Encounter for screening for cardiovascular disorders: Secondary | ICD-10-CM | POA: Diagnosis not present

## 2017-08-12 DIAGNOSIS — E663 Overweight: Secondary | ICD-10-CM | POA: Diagnosis not present

## 2017-08-12 DIAGNOSIS — F32A Depression, unspecified: Secondary | ICD-10-CM

## 2017-08-12 DIAGNOSIS — F329 Major depressive disorder, single episode, unspecified: Secondary | ICD-10-CM | POA: Diagnosis not present

## 2017-08-12 DIAGNOSIS — J069 Acute upper respiratory infection, unspecified: Secondary | ICD-10-CM

## 2017-08-12 DIAGNOSIS — F419 Anxiety disorder, unspecified: Secondary | ICD-10-CM

## 2017-08-12 DIAGNOSIS — K580 Irritable bowel syndrome with diarrhea: Secondary | ICD-10-CM

## 2017-08-12 LAB — LIPID PANEL
CHOL/HDL RATIO: 3
Cholesterol: 200 mg/dL (ref 0–200)
HDL: 66.1 mg/dL (ref >39.00)
LDL Cholesterol: 120 mg/dL — ABNORMAL HIGH (ref 0–99)
NONHDL: 133.69
Triglycerides: 69 mg/dL (ref 0.0–149.0)
VLDL: 13.8 mg/dL (ref 0.0–40.0)

## 2017-08-12 LAB — COMPREHENSIVE METABOLIC PANEL
ALT: 7 U/L (ref 0–35)
AST: 12 U/L (ref 0–37)
Albumin: 4 g/dL (ref 3.5–5.2)
Alkaline Phosphatase: 74 U/L (ref 39–117)
BILIRUBIN TOTAL: 0.6 mg/dL (ref 0.2–1.2)
BUN: 10 mg/dL (ref 6–23)
CO2: 29 mEq/L (ref 19–32)
Calcium: 9 mg/dL (ref 8.4–10.5)
Chloride: 103 mEq/L (ref 96–112)
Creatinine, Ser: 0.65 mg/dL (ref 0.40–1.20)
GFR: 110.02 mL/min (ref >60.00)
GLUCOSE: 81 mg/dL (ref 70–99)
Potassium: 4.1 mEq/L (ref 3.5–5.1)
Sodium: 138 mEq/L (ref 135–145)
Total Protein: 6.9 g/dL (ref 6.0–8.3)

## 2017-08-12 LAB — CBC WITH DIFFERENTIAL/PLATELET
BASOS ABS: 0 10*3/uL (ref 0.0–0.1)
Basophils Relative: 0.4 % (ref 0.0–3.0)
EOS ABS: 0.1 10*3/uL (ref 0.0–0.7)
Eosinophils Relative: 2.6 % (ref 0.0–5.0)
HCT: 43 % (ref 36.0–46.0)
Hemoglobin: 14.4 g/dL (ref 12.0–15.0)
LYMPHS ABS: 1.5 10*3/uL (ref 0.7–4.0)
Lymphocytes Relative: 25.8 % (ref 12.0–46.0)
MCHC: 33.4 g/dL (ref 30.0–36.0)
MCV: 86.4 fl (ref 78.0–100.0)
Monocytes Absolute: 0.6 10*3/uL (ref 0.1–1.0)
Monocytes Relative: 9.7 % (ref 3.0–12.0)
NEUTROS ABS: 3.6 10*3/uL (ref 1.4–7.7)
NEUTROS PCT: 61.5 % (ref 43.0–77.0)
PLATELETS: 247 10*3/uL (ref 150.0–400.0)
RBC: 4.98 Mil/uL (ref 3.87–5.11)
RDW: 13.7 % (ref 11.5–15.5)
WBC: 5.8 10*3/uL (ref 4.0–10.5)

## 2017-08-12 LAB — VITAMIN D 25 HYDROXY (VIT D DEFICIENCY, FRACTURES): VITD: 16.99 ng/mL — ABNORMAL LOW (ref 30.00–100.00)

## 2017-08-12 LAB — TSH: TSH: 0.88 u[IU]/mL (ref 0.35–4.50)

## 2017-08-12 MED ORDER — AMLODIPINE BESYLATE 10 MG PO TABS: 10.0000 mg | ORAL_TABLET | Freq: Every day | ORAL | 5 refills | Status: DC

## 2017-08-12 MED ORDER — AMOXICILLIN-POT CLAVULANATE 875-125 MG PO TABS: 1.0000 | ORAL_TABLET | Freq: Two times a day (BID) | ORAL | 0 refills | Status: DC

## 2017-08-12 MED ORDER — VITAMIN D (ERGOCALCIFEROL) 1.25 MG (50000 UNIT) PO CAPS: 50000.0000 [IU] | ORAL_CAPSULE | ORAL | 0 refills | Status: DC

## 2017-08-12 NOTE — Assessment & Plan Note (Signed)
Body mass index is 27.46 kg/m. Currently working out daily. Commended efforts on this as well as cleaning up diet.

## 2017-08-12 NOTE — Assessment & Plan Note (Signed)
Well controlled. Continue plan of Imitrex prn.

## 2017-08-12 NOTE — Assessment & Plan Note (Signed)
Essentially resolved at this point 2/2 increase in exercise per patient. Follow-up if symptoms return.

## 2017-08-12 NOTE — Patient Instructions (Signed)
It was great to see you!  Please follow-up in 6 months for your blood pressure. I have refilled this for you.  Health Maintenance, Female Adopting a healthy lifestyle and getting preventive care can go a long way to promote health and wellness. Talk with your health care provider about what schedule of regular examinations is right for you. This is a good chance for you to check in with your provider about disease prevention and staying healthy. In between checkups, there are plenty of things you can do on your own. Experts have done a lot of research about which lifestyle changes and preventive measures are most likely to keep you healthy. Ask your health care provider for more information. Weight and diet Eat a healthy diet  Be sure to include plenty of vegetables, fruits, low-fat dairy products, and lean protein.  Do not eat a lot of foods high in solid fats, added sugars, or salt.  Get regular exercise. This is one of the most important things you can do for your health. ? Most adults should exercise for at least 150 minutes each week. The exercise should increase your heart rate and make you sweat (moderate-intensity exercise). ? Most adults should also do strengthening exercises at least twice a week. This is in addition to the moderate-intensity exercise.  Maintain a healthy weight  Body mass index (BMI) is a measurement that can be used to identify possible weight problems. It estimates body fat based on height and weight. Your health care provider can help determine your BMI and help you achieve or maintain a healthy weight.  For females 71 years of age and older: ? A BMI below 18.5 is considered underweight. ? A BMI of 18.5 to 24.9 is normal. ? A BMI of 25 to 29.9 is considered overweight. ? A BMI of 30 and above is considered obese.  Watch levels of cholesterol and blood lipids  You should start having your blood tested for lipids and cholesterol at 36 years of age, then have  this test every 5 years.  You may need to have your cholesterol levels checked more often if: ? Your lipid or cholesterol levels are high. ? You are older than 36 years of age. ? You are at high risk for heart disease.  Cancer screening Lung Cancer  Lung cancer screening is recommended for adults 27-38 years old who are at high risk for lung cancer because of a history of smoking.  A yearly low-dose CT scan of the lungs is recommended for people who: ? Currently smoke. ? Have quit within the past 15 years. ? Have at least a 30-pack-year history of smoking. A pack year is smoking an average of one pack of cigarettes a day for 1 year.  Yearly screening should continue until it has been 15 years since you quit.  Yearly screening should stop if you develop a health problem that would prevent you from having lung cancer treatment.  Breast Cancer  Practice breast self-awareness. This means understanding how your breasts normally appear and feel.  It also means doing regular breast self-exams. Let your health care provider know about any changes, no matter how small.  If you are in your 20s or 30s, you should have a clinical breast exam (CBE) by a health care provider every 1-3 years as part of a regular health exam.  If you are 93 or older, have a CBE every year. Also consider having a breast X-ray (mammogram) every year.  If you  have a family history of breast cancer, talk to your health care provider about genetic screening.  If you are at high risk for breast cancer, talk to your health care provider about having an MRI and a mammogram every year.  Breast cancer gene (BRCA) assessment is recommended for women who have family members with BRCA-related cancers. BRCA-related cancers include: ? Breast. ? Ovarian. ? Tubal. ? Peritoneal cancers.  Results of the assessment will determine the need for genetic counseling and BRCA1 and BRCA2 testing.  Cervical Cancer Your health care  provider may recommend that you be screened regularly for cancer of the pelvic organs (ovaries, uterus, and vagina). This screening involves a pelvic examination, including checking for microscopic changes to the surface of your cervix (Pap test). You may be encouraged to have this screening done every 3 years, beginning at age 33.  For women ages 27-65, health care providers may recommend pelvic exams and Pap testing every 3 years, or they may recommend the Pap and pelvic exam, combined with testing for human papilloma virus (HPV), every 5 years. Some types of HPV increase your risk of cervical cancer. Testing for HPV may also be done on women of any age with unclear Pap test results.  Other health care providers may not recommend any screening for nonpregnant women who are considered low risk for pelvic cancer and who do not have symptoms. Ask your health care provider if a screening pelvic exam is right for you.  If you have had past treatment for cervical cancer or a condition that could lead to cancer, you need Pap tests and screening for cancer for at least 20 years after your treatment. If Pap tests have been discontinued, your risk factors (such as having a new sexual partner) need to be reassessed to determine if screening should resume. Some women have medical problems that increase the chance of getting cervical cancer. In these cases, your health care provider may recommend more frequent screening and Pap tests.  Colorectal Cancer  This type of cancer can be detected and often prevented.  Routine colorectal cancer screening usually begins at 36 years of age and continues through 36 years of age.  Your health care provider may recommend screening at an earlier age if you have risk factors for colon cancer.  Your health care provider may also recommend using home test kits to check for hidden blood in the stool.  A small camera at the end of a tube can be used to examine your colon  directly (sigmoidoscopy or colonoscopy). This is done to check for the earliest forms of colorectal cancer.  Routine screening usually begins at age 61.  Direct examination of the colon should be repeated every 5-10 years through 36 years of age. However, you may need to be screened more often if early forms of precancerous polyps or small growths are found.  Skin Cancer  Check your skin from head to toe regularly.  Tell your health care provider about any new moles or changes in moles, especially if there is a change in a mole's shape or color.  Also tell your health care provider if you have a mole that is larger than the size of a pencil eraser.  Always use sunscreen. Apply sunscreen liberally and repeatedly throughout the day.  Protect yourself by wearing long sleeves, pants, a wide-brimmed hat, and sunglasses whenever you are outside.  Heart disease, diabetes, and high blood pressure  High blood pressure causes heart disease and increases the  risk of stroke. High blood pressure is more likely to develop in: ? People who have blood pressure in the high end of the normal range (130-139/85-89 mm Hg). ? People who are overweight or obese. ? People who are African American.  If you are 35-9 years of age, have your blood pressure checked every 3-5 years. If you are 33 years of age or older, have your blood pressure checked every year. You should have your blood pressure measured twice-once when you are at a hospital or clinic, and once when you are not at a hospital or clinic. Record the average of the two measurements. To check your blood pressure when you are not at a hospital or clinic, you can use: ? An automated blood pressure machine at a pharmacy. ? A home blood pressure monitor.  If you are between 60 years and 104 years old, ask your health care provider if you should take aspirin to prevent strokes.  Have regular diabetes screenings. This involves taking a blood sample to  check your fasting blood sugar level. ? If you are at a normal weight and have a low risk for diabetes, have this test once every three years after 36 years of age. ? If you are overweight and have a high risk for diabetes, consider being tested at a younger age or more often. Preventing infection Hepatitis B  If you have a higher risk for hepatitis B, you should be screened for this virus. You are considered at high risk for hepatitis B if: ? You were born in a country where hepatitis B is common. Ask your health care provider which countries are considered high risk. ? Your parents were born in a high-risk country, and you have not been immunized against hepatitis B (hepatitis B vaccine). ? You have HIV or AIDS. ? You use needles to inject street drugs. ? You live with someone who has hepatitis B. ? You have had sex with someone who has hepatitis B. ? You get hemodialysis treatment. ? You take certain medicines for conditions, including cancer, organ transplantation, and autoimmune conditions.  Hepatitis C  Blood testing is recommended for: ? Everyone born from [birth year] through 1964. ? Anyone with known risk factors for hepatitis C.  Sexually transmitted infections (STIs)  You should be screened for sexually transmitted infections (STIs) including gonorrhea and chlamydia if: ? You are sexually active and are younger than 36 years of age. ? You are older than 36 years of age and your health care provider tells you that you are at risk for this type of infection. ? Your sexual activity has changed since you were last screened and you are at an increased risk for chlamydia or gonorrhea. Ask your health care provider if you are at risk.  If you do not have HIV, but are at risk, it may be recommended that you take a prescription medicine daily to prevent HIV infection. This is called pre-exposure prophylaxis (PrEP). You are considered at risk if: ? You are sexually active and do not regularly  use condoms or know the HIV status of your partner(s). ? You take drugs by injection. ? You are sexually active with a partner who has HIV.  Talk with your health care provider about whether you are at high risk of being infected with HIV. If you choose to begin PrEP, you should first be tested for HIV. You should then be tested every 3 months for as long as you are taking PrEP. Pregnancy  If you are premenopausal and you may become pregnant, ask your health care provider about preconception counseling.  If you may become pregnant, take 400 to 800 micrograms (mcg) of folic acid every day.  If you want to prevent pregnancy, talk to your health care provider about birth control (contraception). Osteoporosis and menopause  Osteoporosis is a disease in which the bones lose minerals and strength with aging. This can result in serious bone fractures. Your risk for osteoporosis can be identified using a bone density scan.  If you are 36 years of age or older, or if you are at risk for osteoporosis and fractures, ask your health care provider if you should be screened.  Ask your health care provider whether you should take a calcium or vitamin D supplement to lower your risk for osteoporosis.  Menopause may have certain physical symptoms and risks.  Hormone replacement therapy may reduce some of these symptoms and risks. Talk to your health care provider about whether hormone replacement therapy is right for you. Follow these instructions at home:  Schedule regular health, dental, and eye exams.  Stay current with your immunizations.  Do not use any tobacco products including cigarettes, chewing tobacco, or electronic cigarettes.  If you are pregnant, do not drink alcohol.  If you are breastfeeding, limit how much and how often you drink alcohol.  Limit alcohol intake to no more than 1 drink per day for nonpregnant women. One drink equals 12 ounces of beer, 5 ounces of wine, or 1 ounces  of hard liquor.  Do not use street drugs.  Do not share needles.  Ask your health care provider for help if you need support or information about quitting drugs.  Tell your health care provider if you often feel depressed.  Tell your health care provider if you have ever been abused or do not feel safe at home. This information is not intended to replace advice given to you by your health care provider. Make sure you discuss any questions you have with your health care provider. Document Released: 10/14/2010 Document Revised: 09/06/2015 Document Reviewed: 01/02/2015 Elsevier Interactive Patient Education  [employer].

## 2017-08-12 NOTE — Assessment & Plan Note (Signed)
Continue with high fiber diet. No medications per patient request (did briefly discuss Bentyl.) Overall stable.

## 2017-08-12 NOTE — Progress Notes (Signed)
I acted as a Education administrator for [employer], PA-C Anselmo Pickler, LPN  Subjective:    Alisha Hughes is a 37 y.o. female and is here for a comprehensive physical exam.  HPI  There are no preventive care reminders to display for this patient.  Acute Concerns: URI -- has had two days of symptoms -- including sinus pressure, runny nose, ear pressure. Having green sputum. No fever or chills. Appetite is normal. Everyone in her house has similar symptoms since they returned from [place] last week. Has tried Mucinex.  Chronic Issues: Anxiety and depression -- now working out every day, has taking herself off of her Zoloft, denies any highs/lows, feels MUCH better IBS -- still having issues; having diarrhea; has not tried bentyl and isn't really interested in that at this time Vit D deficiency -- takes supplement approximately 1 x a month HTN -- Currently taking Norvasc 10 mg. At home blood pressure readings are: 120/80. Patient denies chest pain, SOB, blurred vision, dizziness, unusual headaches, lower leg swelling. Patient is compliant with medication when she has medication to take, but is currently out. Denies excessive caffeine intake, stimulant usage, excessive alcohol intake, or increase in salt consumption. Migraine -- hasn't had to use Imitrex recently, last use was 4 months ago  Wt Readings from Last 5 Encounters:  08/12/17 155 lb (70.3 kg)  01/13/17 154 lb (69.9 kg)  12/31/16 154 lb (69.9 kg)  10/09/16 153 lb (69.4 kg)  08/28/16 149 lb (67.6 kg)   Health Maintenance: Immunizations -- UTD Mammogram -- UTD PAP -- UTD Diet -- water, has stopped drinking Pepsi; been eating better and doing meal prep; eats lots of chicken/rice Caffeine intake -- coffee every day Sleep habits -- occasionally has to get up to help with kids Exercise -- working out every day Weight -- Weight: 155 lb (70.3 kg)  Mood -- mood  Last period -- Patient's last menstrual period was 08/11/2017. Period  characteristics -- 4-5 days and not painful   Depression screen [facility] 2/9 08/12/2017  Decreased Interest 0  Down, Depressed, Hopeless 0  PHQ - 2 Score 0  Altered sleeping 0  Tired, decreased energy 0  Change in appetite 0  Feeling bad or failure about yourself  0  Trouble concentrating 0  Moving slowly or fidgety/restless 0  Suicidal thoughts 0  PHQ-9 Score 0  Difficult doing work/chores Not difficult at all   GAD 7 : Generalized Anxiety Score 08/12/2017  Nervous, Anxious, on Edge 0  Control/stop worrying 0  Worry too much - different things 0  Trouble relaxing 0  Restless 0  Easily annoyed or irritable 0  Afraid - awful might happen 0  Total GAD 7 Score 0  Anxiety Difficulty Not difficult at all   Other providers/specialists: Ob-Gyn  PMHx, SurgHx, SocialHx, Medications, and Allergies were reviewed in the Visit Navigator and updated as appropriate.   Past Medical History:  Diagnosis Date  . Anxiety and depression   . Hyperlipidemia      Past Surgical History:  Procedure Laterality Date  . CESAREAN SECTION    . CESAREAN SECTION N/A 12/19/2012   Procedure: Repeat cesarean section with delivery of baby girl at [place].    Bilateral tubal ligation with filshie clips.;  Surgeon: Donnamae Jude, MD;  Location: [facility] [facility];  Service: Obstetrics;  Laterality: N/A;  . CHOLECYSTECTOMY  2010  . COLONOSCOPY     2009  . COLPOSCOPY VULVA W/ BIOPSY     abnormal pap  .  ENDOMETRIAL ABLATION N/A 07/17/2015   Procedure: ENDOMETRIAL ABLATION WITH HTA, DILATATION AND CURRATAGE;  Surgeon: Emily Filbert, MD;  Location: [facility] [facility];  Service: Gynecology;  Laterality: N/A;  . KNEE ARTHROSCOPY    . LAPAROSCOPIC LYSIS OF ADHESIONS  07/17/2015   Procedure: LAPAROSCOPIC LYSIS OF ADHESIONS;  Surgeon: Emily Filbert, MD;  Location: [facility];  Service: Gynecology;;  . LAPAROSCOPY N/A 07/17/2015   Procedure: LAPAROSCOPY DIAGNOSTIC;  Surgeon: Emily Filbert, MD;  Location: [facility];  Service: Gynecology;  Laterality: N/A;  . TUBAL  LIGATION    . UPPER GI ENDOSCOPY    . WISDOM TOOTH EXTRACTION       Family History  Problem Relation Age of Onset  . Hypertension Mother   . Anuerysm Mother        brain  . Migraines Mother   . Heart disease Father   . Hypertension Father   . Hyperlipidemia Father   . Prostate cancer Father   . Depression Brother   . Multiple sclerosis Maternal Grandmother   . Sudden death Maternal Grandmother   . Heart disease Maternal Grandfather   . Sudden death Maternal Grandfather   . Heart disease Paternal Grandmother   . Diabetes Paternal Grandmother   . Parkinson's disease Paternal Grandfather     Social History   Tobacco Use  . Smoking status: Never Smoker  . Smokeless tobacco: Never Used  Substance Use Topics  . Alcohol use: Yes    Alcohol/week: 0.0 oz    Comment: rarely  . Drug use: No    Review of Systems:   Review of Systems  Constitutional: Negative.  Negative for chills, fever, malaise/fatigue and weight loss.  HENT: Positive for sinus pain. Negative for hearing loss and sore throat.   Eyes: Negative.  Negative for blurred vision.  Respiratory: Negative.  Negative for cough and shortness of breath.   Cardiovascular: Negative.  Negative for chest pain, palpitations and leg swelling.  Gastrointestinal: Negative.  Negative for abdominal pain, constipation, diarrhea, heartburn, nausea and vomiting.  Genitourinary: Negative.  Negative for dysuria, frequency and urgency.  Musculoskeletal: Negative.  Negative for back pain, myalgias and neck pain.  Skin: Negative.  Negative for itching and rash.  Neurological: Negative.  Negative for dizziness, tingling, seizures, loss of consciousness and headaches.  Endo/Heme/Allergies: Negative.  Negative for polydipsia.  Psychiatric/Behavioral: Negative.  Negative for depression. The patient is not nervous/anxious.     Objective:   BP (!) 132/98 (BP Location: Left Arm, Patient Position: Sitting, Cuff Size: Normal)   Pulse 63    Temp 97.9 F (36.6 C) (Oral)   Ht 5\' 3"  (1.6 m)   Wt 155 lb (70.3 kg)   LMP 08/11/2017   SpO2 100%   BMI 27.46 kg/m   General Appearance:    Alert, cooperative, no distress, appears stated age  Head:    Normocephalic, without obvious abnormality, atraumatic  Eyes:    PERRL, conjunctiva/corneas clear, EOM's intact, fundi    benign, both eyes  Ears:    Normal TM's and external ear canals, both ears  Nose:   Nares normal, septum midline, inflamed mucosa, yellow and green drainage; maxillary sinus tenderness  Throat:   Lips, mucosa, and tongue normal; teeth and gums normal  Neck:   Supple, symmetrical, trachea midline, no adenopathy;    thyroid:  no enlargement/tenderness/nodules; no carotid   bruit or JVD  Back:     Symmetric, no curvature, ROM normal, no CVA tenderness  Lungs:  Clear to auscultation bilaterally, respirations unlabored  Chest Wall:    No tenderness or deformity   Heart:    Regular rate and rhythm, S1 and S2 normal, no murmur, rub   or gallop  Breast Exam:    No tenderness, masses, or nipple abnormality  Abdomen:     Soft, non-tender, bowel sounds active all four quadrants,    no masses, no organomegaly  Genitalia:    Normal female without lesion, discharge or tenderness  Rectal:    Normal tone no masses or tenderness  Extremities:   Extremities normal, atraumatic, no cyanosis or edema  Pulses:   2+ and symmetric all extremities  Skin:   Skin color, texture, turgor normal, no rashes or lesions  Lymph nodes:   Cervical, supraclavicular, and axillary nodes normal  Neurologic:   CNII-XII intact, normal strength, sensation and reflexes    throughout    Assessment/Plan:   Problem List Items Addressed This Visit      Cardiovascular and Mediastinum   Chronic migraine without aura    Well controlled. Continue plan of Imitrex prn.      Relevant Medications   amLODipine (NORVASC) 10 MG tablet   Essential hypertension    Well controlled. Continue Norvasc 10 mg  daily.      Relevant Medications   amLODipine (NORVASC) 10 MG tablet   Other Relevant Orders   CBC with Differential/Platelet   Comprehensive metabolic panel   TSH     Digestive   Irritable bowel syndrome with diarrhea    Continue with high fiber diet. No medications per patient request (did briefly discuss Bentyl.) Overall stable.        Other   Anxiety and depression    Essentially resolved at this point 2/2 increase in exercise per patient. Follow-up if symptoms return.      Vitamin D deficiency    Will re-check today. Interventions based on results.      Relevant Orders   VITAMIN D 25 Hydroxy (Vit-D Deficiency, Fractures)   Overweight    Body mass index is 27.46 kg/m. Currently working out daily. Commended efforts on this as well as cleaning up diet.      Relevant Orders   TSH    Other Visit Diagnoses    Encounter for general adult medical examination with abnormal findings    -  Primary Today patient counseled on age appropriate routine health concerns for screening and prevention, each reviewed and up to date or declined. Immunizations reviewed and up to date or declined. Labs ordered and reviewed. Risk factors for depression reviewed and negative. Hearing function and visual acuity are intact. ADLs screened and addressed as needed. Functional ability and level of safety reviewed and appropriate. Education, counseling and referrals performed based on assessed risks today. Patient provided with a copy of personalized plan for preventive services.   Encounter for lipid screening for cardiovascular disease       Relevant Orders   Lipid panel   Upper respiratory tract infection, unspecified type     No red flags on exam.  Discussed that this is likely viral, however did provide pocket prescription of Augmentin. Discussed taking medications as prescribed. Reviewed return precautions including worsening fever, SOB, worsening cough or other concerns. Push fluids and rest. I  recommend that patient follow-up if symptoms worsen or persist despite treatment x 7-10 days, sooner if needed.       Well Adult Exam: Labs ordered: Yes. Patient counseling was done. See below for items  discussed. Discussed the patient's BMI. The BMI BMI is not in the acceptable range; BMI management plan is completed Follow up in 6 months.  Patient Counseling:   [x]     Nutrition: Stressed importance of moderation in sodium/caffeine intake, saturated fat and cholesterol, caloric balance, sufficient intake of fresh fruits, vegetables, fiber, calcium, iron, and 1 mg of folate supplement per day (for females capable of pregnancy).   [x]      Stressed the importance of regular exercise.    [x]     Substance Abuse: Discussed cessation/primary prevention of tobacco, alcohol, or other drug use; driving or other dangerous activities under the influence; availability of treatment for abuse.    [x]      Injury prevention: Discussed safety belts, safety helmets, smoke detector, smoking near bedding or upholstery.    [x]      Sexuality: Discussed sexually transmitted diseases, partner selection, use of condoms, avoidance of unintended pregnancy  and contraceptive alternatives.    [x]     Dental health: Discussed importance of regular tooth brushing, flossing, and dental visits.   [x]      Health maintenance and immunizations reviewed. Please refer to Health maintenance section.   CMA or LPN served as scribe during this visit. History, Physical, and Plan performed by medical provider. Documentation and orders reviewed and attested to.  Inda Coke, PA-C [place]

## 2017-08-12 NOTE — Assessment & Plan Note (Signed)
Well controlled. Continue Norvasc 10 mg daily.

## 2017-08-12 NOTE — Assessment & Plan Note (Signed)
Will re-check today. Interventions based on results.

## 2017-08-19 ENCOUNTER — Encounter: Payer: Self-pay | Admitting: Physician Assistant

## 2017-08-19 ENCOUNTER — Other Ambulatory Visit: Payer: Self-pay | Admitting: Physician Assistant

## 2017-08-19 MED ORDER — BENZONATATE 200 MG PO CAPS: 200.0000 mg | ORAL_CAPSULE | Freq: Two times a day (BID) | ORAL | 0 refills | Status: DC | PRN

## 2017-09-29 DIAGNOSIS — M25561 Pain in right knee: Secondary | ICD-10-CM | POA: Insufficient documentation

## 2017-10-02 DIAGNOSIS — M25561 Pain in right knee: Secondary | ICD-10-CM | POA: Diagnosis not present

## 2017-10-06 DIAGNOSIS — M2241 Chondromalacia patellae, right knee: Secondary | ICD-10-CM | POA: Diagnosis not present

## 2017-10-06 DIAGNOSIS — M25561 Pain in right knee: Secondary | ICD-10-CM | POA: Diagnosis not present

## 2017-11-14 DIAGNOSIS — N3001 Acute cystitis with hematuria: Secondary | ICD-10-CM | POA: Diagnosis not present

## 2017-11-25 ENCOUNTER — Encounter: Payer: Self-pay | Admitting: Physician Assistant

## 2017-11-25 ENCOUNTER — Encounter: Payer: Self-pay | Admitting: *Deleted

## 2017-11-25 ENCOUNTER — Telehealth: Payer: Self-pay | Admitting: Physician Assistant

## 2017-11-25 ENCOUNTER — Ambulatory Visit
Admission: RE | Admit: 2017-11-25 | Discharge: 2017-11-25 | Disposition: A | Discharge status: Home / Self Care | Payer: 59 | Source: Ambulatory Visit | Attending: Physician Assistant | Admitting: Physician Assistant

## 2017-11-25 ENCOUNTER — Ambulatory Visit (INDEPENDENT_AMBULATORY_CARE_PROVIDER_SITE_OTHER): Payer: 59 | Admitting: Physician Assistant

## 2017-11-25 VITALS — BP 138/78 | HR 62 | Temp 97.8°F | Ht 63.0 in | Wt 156.2 lb

## 2017-11-25 DIAGNOSIS — R1084 Generalized abdominal pain: Secondary | ICD-10-CM | POA: Diagnosis not present

## 2017-11-25 DIAGNOSIS — K439 Ventral hernia without obstruction or gangrene: Secondary | ICD-10-CM | POA: Insufficient documentation

## 2017-11-25 DIAGNOSIS — R300 Dysuria: Secondary | ICD-10-CM

## 2017-11-25 DIAGNOSIS — R918 Other nonspecific abnormal finding of lung field: Secondary | ICD-10-CM | POA: Insufficient documentation

## 2017-11-25 DIAGNOSIS — M549 Dorsalgia, unspecified: Secondary | ICD-10-CM | POA: Insufficient documentation

## 2017-11-25 DIAGNOSIS — N200 Calculus of kidney: Secondary | ICD-10-CM | POA: Diagnosis not present

## 2017-11-25 LAB — POCT URINE PREGNANCY: Preg Test, Ur: NEGATIVE

## 2017-11-25 LAB — POCT URINALYSIS DIP (MANUAL ENTRY)
BILIRUBIN UA: NEGATIVE
Glucose, UA: NEGATIVE mg/dL
Leukocytes, UA: NEGATIVE
Nitrite, UA: NEGATIVE
PH UA: 6 (ref 5.0–8.0)
Protein Ur, POC: NEGATIVE mg/dL
Spec Grav, UA: 1.015 (ref 1.010–1.025)
Urobilinogen, UA: 0.2 E.U./dL

## 2017-11-25 LAB — COMPREHENSIVE METABOLIC PANEL
ALBUMIN: 4.1 g/dL (ref 3.5–5.2)
ALT: 15 U/L (ref 0–35)
AST: 21 U/L (ref 0–37)
Alkaline Phosphatase: 64 U/L (ref 39–117)
BUN: 9 mg/dL (ref 6–23)
CALCIUM: 9.6 mg/dL (ref 8.4–10.5)
CO2: 30 meq/L (ref 19–32)
CREATININE: 0.75 mg/dL (ref 0.40–1.20)
Chloride: 104 mEq/L (ref 96–112)
GFR: 93.12 mL/min (ref >60.00)
Glucose, Bld: 92 mg/dL (ref 70–99)
Potassium: 4.2 mEq/L (ref 3.5–5.1)
Sodium: 138 mEq/L (ref 135–145)
Total Bilirubin: 0.7 mg/dL (ref 0.2–1.2)
Total Protein: 6.9 g/dL (ref 6.0–8.3)

## 2017-11-25 LAB — CBC WITH DIFFERENTIAL/PLATELET
BASOS PCT: 0.5 % (ref 0.0–3.0)
Basophils Absolute: 0 10*3/uL (ref 0.0–0.1)
EOS ABS: 0.1 10*3/uL (ref 0.0–0.7)
Eosinophils Relative: 1.1 % (ref 0.0–5.0)
HEMATOCRIT: 41.3 % (ref 36.0–46.0)
HEMOGLOBIN: 13.8 g/dL (ref 12.0–15.0)
LYMPHS PCT: 36.1 % (ref 12.0–46.0)
Lymphs Abs: 1.8 10*3/uL (ref 0.7–4.0)
MCHC: 33.4 g/dL (ref 30.0–36.0)
MCV: 84.3 fl (ref 78.0–100.0)
MONOS PCT: 10.2 % (ref 3.0–12.0)
Monocytes Absolute: 0.5 10*3/uL (ref 0.1–1.0)
NEUTROS ABS: 2.6 10*3/uL (ref 1.4–7.7)
Neutrophils Relative %: 52.1 % (ref 43.0–77.0)
PLATELETS: 286 10*3/uL (ref 150.0–400.0)
RBC: 4.9 Mil/uL (ref 3.87–5.11)
RDW: 13.3 % (ref 11.5–15.5)
WBC: 5.1 10*3/uL (ref 4.0–10.5)

## 2017-11-25 LAB — LIPASE: LIPASE: 18 U/L (ref 11.0–59.0)

## 2017-11-25 MED ORDER — TAMSULOSIN HCL 0.4 MG PO CAPS: 0.4000 mg | ORAL_CAPSULE | Freq: Every day | ORAL | 1 refills | Status: DC

## 2017-11-25 MED ORDER — HYDROCODONE-ACETAMINOPHEN 5-325 MG PO TABS: 1.0000 | ORAL_TABLET | Freq: Four times a day (QID) | ORAL | 0 refills | Status: DC | PRN

## 2017-11-25 MED ORDER — BUPROPION HCL ER (XL) 150 MG PO TB24
ORAL_TABLET | ORAL | 1 refills | Status: DC
Start: 2017-11-25 — End: 2018-11-01

## 2017-11-25 NOTE — Progress Notes (Signed)
Alisha Hughes is a 37 y.o. female here for a follow up of a pre-existing problem.  History of Present Illness:   Chief Complaint  Patient presents with  . Dysuria    Pressure  . Back Pain    Since Saturday    HPI   Dysuria She had an appointment with [facility] on 11/14/17 for dysuria; reports that she had symptoms for a week prior to this appointment. Was having severe burning. Completed Bactrim and Pyridium. On Friday of last week, had a lot of pressure. Having upper and lower back pain. When takes a deep breath, has more pain. Does have hx of kidney stones and ovarian cysts. Pushing fluids. Has felt feverish. Still having diarrhea, no concerns of constipation (has IBS-D).  Anxiety and Depression Last evaluated by [place] in May 2019. At that time she stopped her Zoloft, started working out every day and mentally was doing much better. Her anxiety has increased, had a recent death in the family. She was also on Wellbutrin in the past and is interested in resuming that. Denies any SI/HI.  GAD 7 : Generalized Anxiety Score 11/25/2017 08/12/2017  Nervous, Anxious, on Edge 0 0  Control/stop worrying 2 0  Worry too much - different things 2 0  Trouble relaxing 2 0  Restless 0 0  Easily annoyed or irritable 2 0  Afraid - awful might happen 0 0  Total GAD 7 Score 8 0  Anxiety Difficulty - Not difficult at all    Depression screen [facility] 2/9 11/25/2017 08/12/2017 10/09/2016 07/15/2016 07/09/2016  Decreased Interest 1 0 1 1 3   Down, Depressed, Hopeless 2 0 1 1 0  PHQ - 2 Score 3 0 2 2 3   Altered sleeping 3 0 1 3 1   Tired, decreased energy 2 0 1 2 1   Change in appetite 2 0 1 2 1   Feeling bad or failure about yourself  2 0 0 0 0  Trouble concentrating 0 0 0 3 1  Moving slowly or fidgety/restless 0 0 0 2 1  Suicidal thoughts 0 0 0 0 0  PHQ-9 Score 12 0 5 14 8   Difficult doing work/chores Somewhat difficult Not difficult at all - - -      Past Medical History:  Diagnosis Date  . Anxiety and  depression   . Hyperlipidemia      Social History   Socioeconomic History  . Marital status: Married    Spouse name: Not on file  . Number of children: Not on file  . Years of education: Not on file  . Highest education level: Not on file  Occupational History  . Not on file  Social Needs  . Financial resource strain: Not on file  . Food insecurity:    Worry: Not on file    Inability: Not on file  . Transportation needs:    Medical: Not on file    Non-medical: Not on file  Tobacco Use  . Smoking status: Never Smoker  . Smokeless tobacco: Never Used  Substance and Sexual Activity  . Alcohol use: Yes    Alcohol/week: 0.0 standard drinks    Comment: rarely  . Drug use: No  . Sexual activity: Not on file  Lifestyle  . Physical activity:    Days per week: Not on file    Minutes per session: Not on file  . Stress: Not on file  Relationships  . Social connections:    Talks on phone: Not on file  Gets together: Not on file    Attends religious service: Not on file    Active member of club or organization: Not on file    Attends meetings of clubs or organizations: Not on file    Relationship status: Not on file  . Intimate partner violence:    Fear of current or ex partner: Not on file    Emotionally abused: Not on file    Physically abused: Not on file    Forced sexual activity: Not on file  Other Topics Concern  . 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 CESAREAN SECTION    . CESAREAN SECTION N/A 12/19/2012   Procedure: Repeat cesarean section with delivery of baby girl at [place].    Bilateral tubal ligation with filshie clips.;  Surgeon: Donnamae Jude, MD;  Location: [facility];  Service: Obstetrics;  Laterality: N/A;  .  CHOLECYSTECTOMY  2010  . COLONOSCOPY     2009  . COLPOSCOPY VULVA W/ BIOPSY     abnormal pap  . ENDOMETRIAL ABLATION N/A 07/17/2015   Procedure: ENDOMETRIAL ABLATION WITH HTA, DILATATION AND CURRATAGE;  Surgeon: Emily Filbert, MD;  Location: [facility];  Service: Gynecology;  Laterality: N/A;  . KNEE ARTHROSCOPY    . LAPAROSCOPIC LYSIS OF ADHESIONS  07/17/2015   Procedure: LAPAROSCOPIC LYSIS OF ADHESIONS;  Surgeon: Emily Filbert, MD;  Location: [facility];  Service: Gynecology;;  . LAPAROSCOPY N/A 07/17/2015   Procedure: LAPAROSCOPY DIAGNOSTIC;  Surgeon: Emily Filbert, MD;  Location: [facility];  Service: Gynecology;  Laterality: N/A;  . TUBAL LIGATION    . UPPER GI ENDOSCOPY    . WISDOM TOOTH EXTRACTION      Family History  Problem Relation Age of Onset  . Hypertension Mother   . Anuerysm Mother        brain  . Migraines Mother   . Heart disease Father   . Hypertension Father   . Hyperlipidemia Father   . Prostate cancer Father   . Depression Brother   . Multiple sclerosis Maternal Grandmother   . Sudden death Maternal Grandmother   . Heart disease Maternal Grandfather   . Sudden death Maternal Grandfather   . Heart disease Paternal Grandmother   . Diabetes Paternal Grandmother   . Parkinson's disease Paternal Grandfather     Allergies  Allergen Reactions  . Latex Rash  . Reglan [Metoclopramide] Nausea Only  . Sertraline Rash    Current Medications:   Current Outpatient Medications:  .  amLODipine (NORVASC) 10 MG tablet, Take 1 tablet (10 mg total) by mouth daily., Disp: 30 tablet, Rfl: 5 .  buPROPion (WELLBUTRIN XL) 150 MG 24 hr tablet, Take 150 mg ER PO qam., Disp: 30 tablet, Rfl: 1 .  HYDROcodone-acetaminophen (NORCO/VICODIN) 5-325 MG tablet, Take 1 tablet by mouth every 6 (six) hours as needed for moderate pain., Disp: 20 tablet, Rfl: 0 .  SUMAtriptan (IMITREX) 100 MG tablet, sumatriptan 100 mg tablet PRN, Disp: , Rfl:  .  tamsulosin (FLOMAX) 0.4 MG CAPS capsule, Take 1 capsule (0.4  mg total) by mouth daily., Disp: 30 capsule, Rfl: 1   Review of Systems:   ROS  Negative unless otherwise specified  per HPI.  Vitals:   Vitals:   11/25/17 0932  BP: 138/78  Pulse: 62  Temp: 97.8 F (36.6 C)  TempSrc: Oral  SpO2: 100%  Weight: 156 lb 3.2 oz (70.9 kg)  Height: 5\' 3"  (1.6 m)     Body mass index is 27.67 kg/m.  Physical Exam:   Physical Exam  Constitutional: She appears well-developed. She is cooperative.  Non-toxic appearance. She does not have a sickly appearance. She does not appear ill. No distress.  Cardiovascular: Normal rate, regular rhythm, S1 normal, S2 normal, normal heart sounds and normal pulses.  No LE edema  Pulmonary/Chest: Effort normal and breath sounds normal.  Abdominal: Normal appearance. Bowel sounds are decreased. There is generalized tenderness and tenderness in the left upper quadrant. There is guarding (mild in LUQ). There is no rigidity, no rebound, no CVA tenderness, no tenderness at McBurney's point and negative Murphy's sign.  Neurological: She is alert. GCS eye subscore is 4. GCS verbal subscore is 5. GCS motor subscore is 6.  Skin: Skin is warm, dry and intact.  Psychiatric: She has a normal mood and affect. Her speech is normal and behavior is normal.  Tearful at times  Nursing note and vitals reviewed.   Results for orders placed or performed in visit on 11/25/17  POCT urinalysis dipstick  Result Value Ref Range   Color, UA yellow yellow   Clarity, UA clear clear   Glucose, UA negative negative mg/dL   Bilirubin, UA negative negative   Ketones, POC UA small (15) (A) negative mg/dL   Spec Grav, UA 1.015 1.010 - 1.025   Blood, UA small (A) negative   pH, UA 6.0 5.0 - 8.0   Protein Ur, POC negative negative mg/dL   Urobilinogen, UA 0.2 0.2 or 1.0 E.U./dL   Nitrite, UA Negative Negative   Leukocytes, UA Negative Negative  POCT urine pregnancy  Result Value Ref Range   Preg Test, Ur Negative Negative    Assessment and  Plan:    Sada was seen today for dysuria and back pain.  Diagnoses and all orders for this visit:  Dysuria; Acute bilateral back pain, unspecified back location; Generalized abdominal pain Urine with blood. Suspect possible kidney stone, ovarian cyst, or flare of IBS. Will treat with Flomax and Norco at this time. Labs and urine culture. Will order CT scan for further evaluation. If pain or symptoms worsen in any way, patient was instructed to go to the ER. -     POCT urinalysis dipstick -     POCT urine pregnancy -     Urine Culture -     CBC with Differential/Platelet -     Comprehensive metabolic panel -     Lipase -     CT RENAL STONE STUDY; Future  Other orders -     tamsulosin (FLOMAX) 0.4 MG CAPS capsule; Take 1 capsule (0.4 mg total) by mouth daily. -     Discontinue: HYDROcodone-acetaminophen (NORCO/VICODIN) 5-325 MG tablet; Take 1 tablet by mouth every 6 (six) hours as needed for moderate pain. -     HYDROcodone-acetaminophen (NORCO/VICODIN) 5-325 MG tablet; Take 1 tablet by mouth every 6 (six) hours as needed for moderate pain. -     buPROPion (WELLBUTRIN XL) 150 MG 24 hr tablet; Take 150 mg ER PO qam.   . Reviewed expectations re: course of current medical issues. . Discussed self-management of symptoms. . Outlined signs and symptoms indicating need for more acute intervention. . Patient verbalized  understanding and all questions were answered. . See orders for this visit as documented in the electronic medical record. . Patient received an After-Visit Summary.   Inda Coke, PA-C

## 2017-11-25 NOTE — Telephone Encounter (Signed)
I have reviewed results in Epic. I personally called patient and spoke with her regarding the results.  Inda Coke PA-C 11/25/2017

## 2017-11-25 NOTE — Telephone Encounter (Signed)
Copied from [place] [id number]. Topic: Quick Communication - See Telephone Encounter >> Nov 25, 2017 12:38 PM Vernona Rieger wrote: CRM for notification. See Telephone encounter for: 11/25/17.  [facility] department called to give results to [facility]. Please forward.   Call back @ [phone]

## 2017-11-25 NOTE — Patient Instructions (Signed)
It was great to see you!  We will contact you regarding your lab results. Please go to the CT scan.  I have sent in pain medication, Flomax, and Wellbutrin. Please start the Flomax today, use strainer until we have performed the CT scan to see if you have passed a stone. Rinse strainer thoroughly after use.  If your pain worsens in ANY way --> PLEASE GO TO THE ER.  Let's follow-up in 2 months, sooner if you have concerns.  Take care,  Inda Coke PA-C

## 2017-11-26 ENCOUNTER — Ambulatory Visit: Payer: 59 | Admitting: Physician Assistant

## 2017-11-26 LAB — URINE CULTURE
MICRO NUMBER:: 90965734
SPECIMEN QUALITY: ADEQUATE

## 2017-11-27 ENCOUNTER — Encounter: Payer: Self-pay | Admitting: Physician Assistant

## 2017-11-30 ENCOUNTER — Other Ambulatory Visit: Payer: Self-pay | Admitting: Physician Assistant

## 2017-11-30 DIAGNOSIS — N200 Calculus of kidney: Secondary | ICD-10-CM

## 2017-12-24 ENCOUNTER — Ambulatory Visit: Payer: Self-pay | Admitting: Urology

## 2018-01-25 ENCOUNTER — Ambulatory Visit: Payer: 59 | Admitting: Obstetrics & Gynecology

## 2018-02-02 ENCOUNTER — Emergency Department: Payer: 59

## 2018-02-02 ENCOUNTER — Other Ambulatory Visit: Payer: Self-pay

## 2018-02-02 ENCOUNTER — Emergency Department
Admission: EM | Admit: 2018-02-02 | Discharge: 2018-02-02 | Disposition: A | Discharge status: Home / Self Care | Payer: 59 | Attending: Emergency Medicine | Admitting: Emergency Medicine

## 2018-02-02 ENCOUNTER — Ambulatory Visit: Payer: 59 | Admitting: Physician Assistant

## 2018-02-02 ENCOUNTER — Encounter: Payer: Self-pay | Admitting: Emergency Medicine

## 2018-02-02 DIAGNOSIS — R51 Headache: Secondary | ICD-10-CM | POA: Diagnosis not present

## 2018-02-02 DIAGNOSIS — R079 Chest pain, unspecified: Secondary | ICD-10-CM | POA: Insufficient documentation

## 2018-02-02 DIAGNOSIS — Z79899 Other long term (current) drug therapy: Secondary | ICD-10-CM | POA: Insufficient documentation

## 2018-02-02 DIAGNOSIS — R519 Headache, unspecified: Secondary | ICD-10-CM

## 2018-02-02 DIAGNOSIS — R0789 Other chest pain: Secondary | ICD-10-CM | POA: Diagnosis not present

## 2018-02-02 DIAGNOSIS — R197 Diarrhea, unspecified: Secondary | ICD-10-CM | POA: Insufficient documentation

## 2018-02-02 DIAGNOSIS — I10 Essential (primary) hypertension: Secondary | ICD-10-CM | POA: Diagnosis not present

## 2018-02-02 LAB — BASIC METABOLIC PANEL
Anion gap: 10 (ref 5–15)
BUN: 9 mg/dL (ref 6–20)
CHLORIDE: 104 mmol/L (ref 98–111)
CO2: 26 mmol/L (ref 22–32)
Calcium: 9.4 mg/dL (ref 8.9–10.3)
Creatinine, Ser: 0.7 mg/dL (ref 0.44–1.00)
GFR calc Af Amer: >60 mL/min (ref >60)
GLUCOSE: 103 mg/dL — AB (ref 70–99)
POTASSIUM: 4.3 mmol/L (ref 3.5–5.1)
Sodium: 140 mmol/L (ref 135–145)

## 2018-02-02 LAB — CBC WITH DIFFERENTIAL/PLATELET
Abs Immature Granulocytes: 0.01 10*3/uL (ref 0.00–0.07)
BASOS PCT: 0 %
Basophils Absolute: 0 10*3/uL (ref 0.0–0.1)
EOS ABS: 0 10*3/uL (ref 0.0–0.5)
Eosinophils Relative: 0 %
HEMATOCRIT: 47.9 % — AB (ref 36.0–46.0)
Hemoglobin: 15.4 g/dL — ABNORMAL HIGH (ref 12.0–15.0)
IMMATURE GRANULOCYTES: 0 %
LYMPHS ABS: 1.6 10*3/uL (ref 0.7–4.0)
Lymphocytes Relative: 20 %
MCH: 28.1 pg (ref 26.0–34.0)
MCHC: 32.2 g/dL (ref 30.0–36.0)
MCV: 87.4 fL (ref 80.0–100.0)
Monocytes Absolute: 0.7 10*3/uL (ref 0.1–1.0)
Monocytes Relative: 9 %
NEUTROS ABS: 5.5 10*3/uL (ref 1.7–7.7)
Neutrophils Relative %: 71 %
PLATELETS: 322 10*3/uL (ref 150–400)
RBC: 5.48 MIL/uL — ABNORMAL HIGH (ref 3.87–5.11)
RDW: 12.5 % (ref 11.5–15.5)
WBC: 7.9 10*3/uL (ref 4.0–10.5)
nRBC: 0 % (ref 0.0–0.2)

## 2018-02-02 LAB — TROPONIN I: Troponin I: <0.03 ng/mL (ref <0.03)

## 2018-02-02 MED ORDER — HYDROCODONE-ACETAMINOPHEN 5-325 MG PO TABS: 1.0000 | ORAL_TABLET | Freq: Four times a day (QID) | ORAL | 0 refills | Status: DC | PRN

## 2018-02-02 MED ORDER — SODIUM CHLORIDE 0.9 % IV BOLUS
250.0000 mL | Freq: Once | INTRAVENOUS | Status: AC
  Administered 2018-02-02: 250 mL via INTRAVENOUS

## 2018-02-02 MED ORDER — ONDANSETRON HCL 4 MG PO TABS: 4.0000 mg | ORAL_TABLET | Freq: Three times a day (TID) | ORAL | 0 refills | Status: DC | PRN

## 2018-02-02 MED ORDER — ASPIRIN 81 MG PO CHEW
324.0000 mg | CHEWABLE_TABLET | Freq: Once | ORAL | Status: AC
  Administered 2018-02-02: 324 mg via ORAL
  Filled 2018-02-02: qty 4

## 2018-02-02 MED ORDER — MORPHINE SULFATE (PF) 4 MG/ML IV SOLN
4.0000 mg | Freq: Once | INTRAVENOUS | Status: AC
  Administered 2018-02-02: 4 mg via INTRAVENOUS
  Filled 2018-02-02: qty 1

## 2018-02-02 MED ORDER — ONDANSETRON HCL 4 MG/2ML IJ SOLN
4.0000 mg | Freq: Once | INTRAMUSCULAR | Status: AC
  Administered 2018-02-02: 4 mg via INTRAVENOUS
  Filled 2018-02-02: qty 2

## 2018-02-02 MED ORDER — MORPHINE SULFATE (PF) 4 MG/ML IV SOLN
4.0000 mg | Freq: Once | INTRAVENOUS | Status: DC
  Filled 2018-02-02: qty 1

## 2018-02-02 NOTE — ED Notes (Signed)
1 attempt at IV. Pt leaving for CT.

## 2018-02-02 NOTE — ED Notes (Signed)
Pt reports severe HA; nausea; chest/jaw/L arm pain.

## 2018-02-02 NOTE — Discharge Instructions (Addendum)
You are evaluated for headache, chest pain, abdominal pain, diarrhea, and elevated blood pressure, and as we discussed her exam and evaluation overall reassuring in the emergency department today.  I am most suspicious for viral syndrome with the diarrhea, and you may try Imodium, over-the-counter, use as directed on labeling.  Make sure you drink plenty fluids  We discussed, I do recommend follow-up with a cardiologist.  Return to emergency department immediately for any worsening condition including confusion altered mental status, weakness, numbness, dizziness or passing out, new or worsening headache, vision changes, slurred speech, chest pain or trouble breathing, fever, worsening abdominal pain, black or bloody stool, or any other symptoms concerning to you.

## 2018-02-02 NOTE — ED Provider Notes (Signed)
[facility] Emergency Department Provider Note ____________________________________________   I have reviewed the triage vital signs and the triage nursing note.  HISTORY  Chief Complaint Chest Pain; Headache; and Diarrhea   Historian Patient, and mother in the room  HPI Alisha Hughes is a 37 y.o. female with a history of hypertension, anxiety, and a family history of brain aneurysms for which she gets screening head CT every 5 years reportedly, presents with overall not feeling well with nausea and body aches and moderate global headache which was gradual onset somewhat waxing and waning since yesterday evening.  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  Overnight she had numerous episodes of watery diarrhea.  No bloody diarrhea.  No bloody emesis.  She started to have central chest pain that went to the left arm in the left neck yesterday.  She is continued to have nausea.  She noted that her blood pressure has been significantly elevated as well.  Her headache is also continued.  No neck stiffness.  No fevers.     Past Medical History:  Diagnosis Date  . Anxiety   . Anxiety and depression   . Hyperlipidemia   . Hypertension     Patient Active Problem List   Diagnosis Date Noted  . 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 Pain in right knee 09/29/2017  . Overweight 08/12/2017  . Irritable bowel syndrome with diarrhea 07/15/2016  . Vitamin D deficiency 07/15/2016  . Anxiety and depression 02/11/2016  . Essential hypertension 02/08/2013  . Chronic migraine without aura 12/07/2012    Past Surgical History:  Procedure Laterality Date  . CESAREAN SECTION    . CESAREAN SECTION N/A 12/19/2012   Procedure: Repeat cesarean section with delivery of baby girl at [place].    Bilateral tubal ligation with filshie clips.;  Surgeon: Donnamae Jude, MD;  Location: [facility];  Service: Obstetrics;  Laterality: N/A;  .  CHOLECYSTECTOMY  2010  . COLONOSCOPY     2009  . COLPOSCOPY VULVA W/ BIOPSY     abnormal pap  . ENDOMETRIAL ABLATION N/A 07/17/2015   Procedure: ENDOMETRIAL ABLATION WITH HTA, DILATATION AND CURRATAGE;  Surgeon: Emily Filbert, MD;  Location: [facility];  Service: Gynecology;  Laterality: N/A;  . KNEE ARTHROSCOPY    . LAPAROSCOPIC LYSIS OF ADHESIONS  07/17/2015   Procedure: LAPAROSCOPIC LYSIS OF ADHESIONS;  Surgeon: Emily Filbert, MD;  Location: [facility];  Service: Gynecology;;  . LAPAROSCOPY N/A 07/17/2015   Procedure: LAPAROSCOPY DIAGNOSTIC;  Surgeon: Emily Filbert, MD;  Location: [facility];  Service: Gynecology;  Laterality: N/A;  . TUBAL LIGATION    . UPPER GI ENDOSCOPY    . WISDOM TOOTH EXTRACTION      Prior to Admission medications   Medication Sig Start Date End Date Taking? Authorizing Provider  amLODipine (NORVASC) 10 MG tablet Take 1 tablet (10 mg total) by mouth daily. 08/12/17   Inda Coke, PA  buPROPion (WELLBUTRIN XL) 150 MG 24 hr tablet Take 150 mg ER PO qam. 11/25/17   Inda Coke, PA  HYDROcodone-acetaminophen (NORCO/VICODIN) 5-325 MG tablet Take 1 tablet by mouth every 6 (six) hours as needed for moderate pain. 02/02/18   Lisa Roca, MD  ondansetron (ZOFRAN) 4 MG tablet Take 1 tablet (4 mg total) by mouth every 8 (eight) hours as needed for nausea or vomiting. 02/02/18   Lisa Roca, MD  SUMAtriptan (IMITREX) 100 MG tablet  sumatriptan 100 mg tablet PRN    [provider]  tamsulosin (FLOMAX) 0.4 MG CAPS capsule Take 1 capsule (0.4 mg total) by mouth daily. 11/25/17   Inda Coke, PA    Allergies  Allergen Reactions  . Latex Rash  . Reglan [Metoclopramide] Nausea Only  . Sertraline Rash    Family History  Problem Relation Age of Onset  . Hypertension Mother   . Anuerysm Mother        brain  . Migraines Mother   . Heart disease Father   . Hypertension Father   . Hyperlipidemia Father   . Prostate cancer Father   . Depression Brother   . Multiple  sclerosis Maternal Grandmother   . Sudden death Maternal Grandmother   . Heart disease Maternal Grandfather   . Sudden death Maternal Grandfather   . Heart disease Paternal Grandmother   . Diabetes Paternal Grandmother   . Parkinson's disease Paternal Grandfather     Social History Social History   Tobacco Use  . Smoking status: Never Smoker  . Smokeless tobacco: Never Used  Substance Use Topics  . Alcohol use: Yes    Alcohol/week: 0.0 standard drinks    Comment: rarely  . Drug use: No    Review of Systems  Constitutional: Negative for fever. Eyes: Negative for visual changes. ENT: Negative for sore throat. Cardiovascular: Positive for chest pain. Respiratory: Negative for shortness of breath. Gastrointestinal: Refer cramping mid lower abdominal pain.  Positive for diarrhea as per HPI. Genitourinary: Negative for dysuria. Musculoskeletal: Negative for back pain. Skin: Negative for rash. Neurological: Positive as per HPI for headache.  ____________________________________________   PHYSICAL EXAM:  VITAL SIGNS: ED Triage Vitals  Enc Vitals Group     BP 02/02/18 0801 (!) 147/102     Pulse Rate 02/02/18 0801 92     Resp 02/02/18 0801 18     Temp 02/02/18 0801 98.2 F (36.8 C)     Temp Source 02/02/18 0801 Oral     SpO2 02/02/18 0801 100 %     Weight 02/02/18 0808 155 lb (70.3 kg)     Height 02/02/18 0808 5\' 3"  (1.6 m)     Head Circumference --      Peak Flow --      Pain Score 02/02/18 0807 8     Pain Loc --      Pain Edu? --      Excl. in [place]? --      Constitutional: Alert and oriented.  HEENT      Head: Normocephalic and atraumatic.      Eyes: Conjunctivae are normal. Pupils equal and round.       Ears:         Nose: No congestion/rhinnorhea.      Mouth/Throat: Mucous membranes are moist.      Neck: No stridor.  Supple, no stiff neck. Cardiovascular/Chest: Normal rate, regular rhythm.  No murmurs, rubs, or gallops. Respiratory: Normal respiratory  effort without tachypnea nor retractions. Breath sounds are clear and equal bilaterally. No wheezes/rales/rhonchi. Gastrointestinal: Soft. No distention, no guarding, no rebound.  Mild tenderness mid lower abdomen without focal point tenderness or McBurney's point tenderness. Genitourinary/rectal:Deferred Musculoskeletal: Nontender with normal range of motion in all extremities. No joint effusions.  No lower extremity tenderness.  No edema. Neurologic: She will droop.  Normal speech and language. No gross or focal neurologic deficits are appreciated.   Skin:  Skin is warm, dry and intact. No rash noted. Psychiatric: Mood and  affect are normal. Speech and behavior are normal. Patient exhibits appropriate insight and judgment.   ____________________________________________  LABS (pertinent positives/negatives) I, Lisa Roca, MD the attending physician have reviewed the labs noted below.  Labs Reviewed  BASIC METABOLIC PANEL - Abnormal; Notable for the following components:      Result Value   Glucose, Bld 103 (*)    All other components within normal limits  CBC WITH DIFFERENTIAL/PLATELET - Abnormal; Notable for the following components:   RBC 5.48 (*)    Hemoglobin 15.4 (*)    HCT 47.9 (*)    All other components within normal limits  TROPONIN I  URINALYSIS, COMPLETE (UACMP) WITH MICROSCOPIC    ____________________________________________    EKG I, Lisa Roca, MD, the attending physician have personally viewed and interpreted all ECGs.  87 bpm.  normal sinus rhythm.  Narrow QS.  Normal axis.  Nonspecific T waves with minimal ST segment depression inferiorly and laterally. ____________________________________________  RADIOLOGY   CT head without contrast, radiology report reviewed:  IMPRESSION: Benign anterior falcine calcification. Study otherwise unremarkable and stable.  Chest x-ray two-view: No edema or  consolidation. __________________________________________  PROCEDURES  Procedure(s) performed: None  Procedures  Critical Care performed: None   ____________________________________________  ED COURSE / ASSESSMENT AND PLAN  Pertinent labs & imaging results that were available during my care of the patient were reviewed by me and considered in my medical decision making (see chart for details).    She is hypertensive here, but not as a hypertensive as she has been at home reportedly, about 148/100 here.  She still complaining of a moderate global headache, she is complaining of central and left-sided chest pain that goes down her arm and her left jaw, as well as crampy lower abdominal pain and numerous episodes of diarrhea overnight.  It is unclear to me whether or not blood pressure went up and then gave her symptoms or symptoms started and that caused her blood pressure to raise secondary to pain which I think is more likely.  Given the constellation of multiple areas with pain and the diarrhea, it seems like this may be viral syndrome.  However given the chest pain that is going to the left side left jaw left arm with some nonspecific findings inferiorly and laterally in this otherwise young patient but does have hypertension, I do think she warrants further investigation.  She was initially held off from aspirin given family history of ruptured brain aneurysms and the headache.  CT head here did not show evidence of a ruptured aneurysm.  At this point I did choose to give aspirin for chest discomfort.  Patient receiving IV fluids as well as Zofran for nausea which she required a second dose of.   Reevaluation around 11 AM, patient is feeling much better.  Blood pressure down to between 503 546 systolic.  Chest pain and abdominal pain are resolved.  Headache is much better and only barely mild at this point.  We discussed nonspecific EKG and negative troponin but do recommend  outpatient follow with cardiology and discussed return precautions.  The chest pain had started at 1 AM, and so we discussed well over 4 hours since onset, and no clear benefit for repeat troponin at this point.  I am most suspicious of likely viral syndrome, but we discussed close follow-up.    CONSULTATIONS:   None   Patient / Family / Caregiver informed of clinical course, medical decision-making process, and agree with plan.  I discussed return precautions, follow-up instructions, and discharge instructions with patient and/or family.  Discharge Instructions : You are evaluated for headache, chest pain, abdominal pain, diarrhea, and elevated blood pressure, and as we discussed her exam and evaluation overall reassuring in the emergency department today.  I am most suspicious for viral syndrome with the diarrhea, and you may try Imodium, over-the-counter, use as directed on labeling.  Make sure you drink plenty fluids  We discussed, I do recommend follow-up with a cardiologist.  Return to emergency department immediately for any worsening condition including confusion altered mental status, weakness, numbness, dizziness or passing out, new or worsening headache, vision changes, slurred speech, chest pain or trouble breathing, fever, worsening abdominal pain, black or bloody stool, or any other symptoms concerning to you.    ___________________________________________   FINAL CLINICAL IMPRESSION(S) / ED DIAGNOSES   Final diagnoses:  Hypertension, unspecified type  Headache, unspecified headache type  Diarrhea of presumed infectious origin  Chest pain, unspecified type      ___________________________________________         Note: This dictation was prepared with Dragon dictation. Any transcriptional errors that result from this process are unintentional    Lisa Roca, MD 02/02/18 1124

## 2018-02-02 NOTE — ED Notes (Signed)
Cancel repeat Trop per Dr Reita Cliche verbal order.

## 2018-02-02 NOTE — ED Triage Notes (Signed)
Pt states left arm and mid to right sided cp that around 1am, called EMS and had high blood pressure 212/118. States diarrhea since yesterday, nauseated. Takes Norvasc 5mg  daily. Does have a hx of panic attacks. Appears anxious. Mom in triage with pt.

## 2018-02-03 DIAGNOSIS — E785 Hyperlipidemia, unspecified: Secondary | ICD-10-CM | POA: Insufficient documentation

## 2018-02-03 DIAGNOSIS — R9431 Abnormal electrocardiogram [ECG] [EKG]: Secondary | ICD-10-CM | POA: Insufficient documentation

## 2018-02-03 DIAGNOSIS — I10 Essential (primary) hypertension: Secondary | ICD-10-CM | POA: Diagnosis not present

## 2018-02-03 DIAGNOSIS — Z8249 Family history of ischemic heart disease and other diseases of the circulatory system: Secondary | ICD-10-CM | POA: Diagnosis not present

## 2018-02-03 HISTORY — DX: Abnormal electrocardiogram (ECG) (EKG): R94.31

## 2018-02-04 ENCOUNTER — Emergency Department
Admission: EM | Admit: 2018-02-04 | Discharge: 2018-02-04 | Disposition: A | Discharge status: Home / Self Care | Payer: 59 | Attending: Student in an Organized Health Care Education/Training Program | Admitting: Student in an Organized Health Care Education/Training Program

## 2018-02-04 ENCOUNTER — Other Ambulatory Visit: Payer: Self-pay

## 2018-02-04 ENCOUNTER — Emergency Department: Payer: 59

## 2018-02-04 ENCOUNTER — Encounter: Payer: Self-pay | Admitting: Physician Assistant

## 2018-02-04 ENCOUNTER — Encounter: Payer: Self-pay | Admitting: Emergency Medicine

## 2018-02-04 DIAGNOSIS — R252 Cramp and spasm: Secondary | ICD-10-CM | POA: Insufficient documentation

## 2018-02-04 DIAGNOSIS — Z91040 Latex allergy status: Secondary | ICD-10-CM | POA: Insufficient documentation

## 2018-02-04 DIAGNOSIS — R0789 Other chest pain: Secondary | ICD-10-CM | POA: Diagnosis not present

## 2018-02-04 DIAGNOSIS — Z79899 Other long term (current) drug therapy: Secondary | ICD-10-CM | POA: Insufficient documentation

## 2018-02-04 DIAGNOSIS — R200 Anesthesia of skin: Secondary | ICD-10-CM | POA: Insufficient documentation

## 2018-02-04 DIAGNOSIS — R51 Headache: Secondary | ICD-10-CM | POA: Insufficient documentation

## 2018-02-04 DIAGNOSIS — I10 Essential (primary) hypertension: Secondary | ICD-10-CM | POA: Diagnosis not present

## 2018-02-04 DIAGNOSIS — R6884 Jaw pain: Secondary | ICD-10-CM | POA: Diagnosis not present

## 2018-02-04 DIAGNOSIS — F419 Anxiety disorder, unspecified: Secondary | ICD-10-CM | POA: Insufficient documentation

## 2018-02-04 DIAGNOSIS — R519 Headache, unspecified: Secondary | ICD-10-CM

## 2018-02-04 LAB — BASIC METABOLIC PANEL
ANION GAP: 10 (ref 5–15)
BUN: 14 mg/dL (ref 6–20)
CHLORIDE: 106 mmol/L (ref 98–111)
CO2: 24 mmol/L (ref 22–32)
Calcium: 9 mg/dL (ref 8.9–10.3)
Creatinine, Ser: 0.69 mg/dL (ref 0.44–1.00)
GFR calc non Af Amer: >60 mL/min (ref >60)
GLUCOSE: 93 mg/dL (ref 70–99)
POTASSIUM: 4.5 mmol/L (ref 3.5–5.1)
Sodium: 140 mmol/L (ref 135–145)

## 2018-02-04 LAB — CBC
HEMATOCRIT: 44.8 % (ref 36.0–46.0)
HEMOGLOBIN: 14.5 g/dL (ref 12.0–15.0)
MCH: 28.3 pg (ref 26.0–34.0)
MCHC: 32.4 g/dL (ref 30.0–36.0)
MCV: 87.5 fL (ref 80.0–100.0)
NRBC: 0 % (ref 0.0–0.2)
PLATELETS: 276 10*3/uL (ref 150–400)
RBC: 5.12 MIL/uL — AB (ref 3.87–5.11)
RDW: 12.5 % (ref 11.5–15.5)
WBC: 8.2 10*3/uL (ref 4.0–10.5)

## 2018-02-04 LAB — TROPONIN I: Troponin I: <0.03 ng/mL (ref <0.03)

## 2018-02-04 MED ORDER — PROCHLORPERAZINE EDISYLATE 10 MG/2ML IJ SOLN
10.0000 mg | Freq: Once | INTRAMUSCULAR | Status: AC
  Administered 2018-02-04: 10 mg via INTRAVENOUS
  Filled 2018-02-04: qty 2

## 2018-02-04 MED ORDER — IOHEXOL 350 MG/ML SOLN
75.0000 mL | Freq: Once | INTRAVENOUS | Status: AC | PRN
  Administered 2018-02-04: 75 mL via INTRAVENOUS

## 2018-02-04 MED ORDER — MAGNESIUM SULFATE 2 GM/50ML IV SOLN
2.0000 g | Freq: Once | INTRAVENOUS | Status: AC
  Administered 2018-02-04: 2 g via INTRAVENOUS
  Filled 2018-02-04: qty 50

## 2018-02-04 MED ORDER — DEXAMETHASONE SODIUM PHOSPHATE 10 MG/ML IJ SOLN
10.0000 mg | Freq: Once | INTRAMUSCULAR | Status: AC
  Administered 2018-02-04: 10 mg via INTRAVENOUS
  Filled 2018-02-04: qty 1

## 2018-02-04 MED ORDER — DIPHENHYDRAMINE HCL 50 MG/ML IJ SOLN
12.5000 mg | Freq: Once | INTRAMUSCULAR | Status: AC
  Administered 2018-02-04: 12.5 mg via INTRAVENOUS
  Filled 2018-02-04: qty 1

## 2018-02-04 MED ORDER — SODIUM CHLORIDE 0.9 % IV BOLUS
500.0000 mL | Freq: Once | INTRAVENOUS | Status: AC
  Administered 2018-02-04: 500 mL via INTRAVENOUS

## 2018-02-04 NOTE — Discharge Instructions (Addendum)

## 2018-02-04 NOTE — ED Notes (Signed)
Pt refusing POC pregnancy at this time.  MD notified.

## 2018-02-04 NOTE — ED Triage Notes (Signed)
Pt presents to ED via POV with c/o CP and SOB, states was seen 2 days ago for same. Pt c/o L sided HA, L sided CP and L arm pain. Pt states no relief with medications she was sent home with. Pt does take home BP medications.

## 2018-02-04 NOTE — ED Provider Notes (Signed)
[facility] Emergency Department Provider Note    First MD Initiated Contact with Patient 02/04/18 1956     (approximate)  I have reviewed the triage vital signs and the nursing notes.   HISTORY  Chief Complaint Chest Pain    HPI Alisha Hughes is a 37 y.o. female with a history of anxiety hyperlipidemia as well as hypertension presents to the ER due to concern for elevated blood pressure as well as worsening headache.  Patient seen in the ER earlier this week for similar symptoms.  States that she has had recurrence of the headache also had some left-sided numbness that started today.  Does have significant family history of cerebral aneurysm.  States she also has a history of migraines.  Denies any fevers.  No chest pains today but is very concerned that her blood pressure is not being managed with her amlodipine 5 mg daily.  Is scheduled for outpatient stress test with cardiology.    Past Medical History:  Diagnosis Date  . Anxiety   . Anxiety and depression   . Hyperlipidemia   . Hypertension    Family History  Problem Relation Age of Onset  . Hypertension Mother   . Anuerysm Mother        brain  . Migraines Mother   . Heart disease Father   . Hypertension Father   . Hyperlipidemia Father   . Prostate cancer Father   . Depression Brother   . Multiple sclerosis Maternal Grandmother   . Sudden death Maternal Grandmother   . Heart disease Maternal Grandfather   . Sudden death Maternal Grandfather   . Heart disease Paternal Grandmother   . Diabetes Paternal Grandmother   . Parkinson's disease Paternal Grandfather    Past Surgical History:  Procedure Laterality Date  . CESAREAN SECTION    . CESAREAN SECTION N/A 12/19/2012   Procedure: Repeat cesarean section with delivery of baby girl at [place].    Bilateral tubal ligation with filshie clips.;  Surgeon: Donnamae Jude, MD;  Location: [facility];  Service: Obstetrics;  Laterality: N/A;  .  CHOLECYSTECTOMY  2010  . COLONOSCOPY     2009  . COLPOSCOPY VULVA W/ BIOPSY     abnormal pap  . ENDOMETRIAL ABLATION N/A 07/17/2015   Procedure: ENDOMETRIAL ABLATION WITH HTA, DILATATION AND CURRATAGE;  Surgeon: Emily Filbert, MD;  Location: [facility];  Service: Gynecology;  Laterality: N/A;  . KNEE ARTHROSCOPY    . LAPAROSCOPIC LYSIS OF ADHESIONS  07/17/2015   Procedure: LAPAROSCOPIC LYSIS OF ADHESIONS;  Surgeon: Emily Filbert, MD;  Location: [facility];  Service: Gynecology;;  . LAPAROSCOPY N/A 07/17/2015   Procedure: LAPAROSCOPY DIAGNOSTIC;  Surgeon: Emily Filbert, MD;  Location: [facility];  Service: Gynecology;  Laterality: N/A;  . TUBAL LIGATION    . UPPER GI ENDOSCOPY    . WISDOM TOOTH EXTRACTION     Patient Active Problem List   Diagnosis Date Noted  . 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 Pain in right knee 09/29/2017  . Overweight 08/12/2017  . Irritable bowel syndrome with diarrhea 07/15/2016  . Vitamin D deficiency 07/15/2016  . Anxiety and depression 02/11/2016  . Essential hypertension 02/08/2013  . Chronic migraine without aura 12/07/2012      Prior to Admission medications   Medication Sig Start Date End Date Taking? Authorizing Provider  amLODipine (NORVASC) 10 MG tablet Take 1 tablet (10 mg total) by mouth daily. 08/12/17   Inda Coke, PA  buPROPion (WELLBUTRIN XL) 150 MG 24 hr tablet Take 150 mg ER PO qam. 11/25/17   Inda Coke, PA  HYDROcodone-acetaminophen (NORCO/VICODIN) 5-325 MG tablet Take 1 tablet by mouth every 6 (six) hours as needed for moderate pain. 02/02/18   Lisa Roca, MD  ondansetron (ZOFRAN) 4 MG tablet Take 1 tablet (4 mg total) by mouth every 8 (eight) hours as needed for nausea or vomiting. 02/02/18   Lisa Roca, MD  SUMAtriptan (IMITREX) 100 MG tablet sumatriptan 100 mg tablet PRN    [provider]  tamsulosin (FLOMAX) 0.4 MG CAPS capsule Take 1 capsule (0.4 mg total) by mouth daily. 11/25/17   Inda Coke, PA     Allergies Latex; Reglan [metoclopramide]; and Sertraline    Social History Social History   Tobacco Use  . Smoking status: Never Smoker  . Smokeless tobacco: Never Used  Substance Use Topics  . Alcohol use: Yes    Alcohol/week: 0.0 standard drinks    Comment: rarely  . Drug use: No    Review of Systems Patient denies headaches, rhinorrhea, blurry vision, numbness, shortness of breath, chest pain, edema, cough, abdominal pain, nausea, vomiting, diarrhea, dysuria, fevers, rashes or hallucinations unless otherwise stated above in HPI. ____________________________________________   PHYSICAL EXAM:  VITAL SIGNS: Vitals:   02/04/18 2145 02/04/18 2200  BP:  (!) 143/89  Pulse: 78   Resp: (!) 21   Temp:    SpO2: 98% 99%    Constitutional: Alert and oriented.  Eyes: Conjunctivae are normal.  Head: Atraumatic. Nose: No congestion/rhinnorhea. Mouth/Throat: Mucous membranes are moist.   Neck: No stridor. Painless ROM.  Cardiovascular: Normal rate, regular rhythm. Grossly normal heart sounds.  Good peripheral circulation. Respiratory: Normal respiratory effort.  No retractions. Lungs CTAB. Gastrointestinal: Soft and nontender. No distention. No abdominal bruits. No CVA tenderness. Genitourinary:  Musculoskeletal: No lower extremity tenderness nor edema.  No joint effusions. Neurologic:  Normal speech and language. No gross focal neurologic deficits are appreciated. No facial droop Skin:  Skin is warm, dry and intact. No rash noted. Psychiatric: Mood and affect are normal. Speech and behavior are normal.  ____________________________________________   LABS (all labs ordered are listed, but only abnormal results are displayed)  Results for orders placed or performed during the hospital encounter of 02/04/18 (from the past 24 hour(s))  Basic metabolic panel     Status: None   Collection Time: 02/04/18  3:30 PM  Result Value Ref Range   Sodium 140 135 - 145 mmol/L    Potassium 4.5 3.5 - 5.1 mmol/L   Chloride 106 98 - 111 mmol/L   CO2 24 22 - 32 mmol/L   Glucose, Bld 93 70 - 99 mg/dL   BUN 14 6 - 20 mg/dL   Creatinine, Ser 0.69 0.44 - 1.00 mg/dL   Calcium 9.0 8.9 - 10.3 mg/dL   GFR calc non Af Amer >60 >60 mL/min   GFR calc Af Amer >60 >60 mL/min   Anion gap 10 5 - 15  CBC     Status: Abnormal   Collection Time: 02/04/18  3:30 PM  Result Value Ref Range   WBC 8.2 4.0 - 10.5 K/uL   RBC 5.12 (H) 3.87 - 5.11 MIL/uL   Hemoglobin 14.5 12.0 - 15.0 g/dL   HCT 44.8 36.0 - 46.0 %   MCV 87.5 80.0 - 100.0 fL   MCH 28.3 26.0 - 34.0 pg   MCHC 32.4 30.0 - 36.0 g/dL   RDW 12.5 11.5 - 15.5 %  Platelets 276 150 - 400 K/uL   nRBC 0.0 0.0 - 0.2 %  Troponin I     Status: None   Collection Time: 02/04/18  3:30 PM  Result Value Ref Range   Troponin I <0.03 <0.03 ng/mL   ____________________________________________  EKG My review and personal interpretation at Time: 14:45   Indication: htn  Rate: 105  Rhythm: sinus Axis: normal Other: normal intervals, no stemi ____________________________________________  RADIOLOGY  I personally reviewed all radiographic images ordered to evaluate for the above acute complaints and reviewed radiology reports and findings.  These findings were personally discussed with the patient.  Please see medical record for radiology report.  ____________________________________________   PROCEDURES  Procedure(s) performed:  Procedures    Critical Care performed: no ____________________________________________   INITIAL IMPRESSION / ASSESSMENT AND PLAN / ED COURSE  Pertinent labs & imaging results that were available during my care of the patient were reviewed by me and considered in my medical decision making (see chart for details).   DDX: Aneurysm, migraine, infectious process, hypertension  KADIENCE MACCHI is a 37 y.o. who presents to the ED with as described above.  Will give migraine cocktail, IV Compazine IV  Benadryl.  Pressure mildly elevated she also seems very anxious.  Will treat pain reassess.  No signs of ACS.  Has adequate outpatient follow-up.  Is not consistent with dissection or PE.  Clinical Course as of Feb 04 2258  Thu Feb 04, 2018  2217 Patient reassessed.  Feels much improved.  CTA shows no evidence of aneurysm acute on normality.  Headache resolved.  Will give Decadron for history of migraines.  Discussed increasing her amlodipine for trial of better blood pressure control and follow-up with PCP.  Have discussed with the patient and available family all diagnostics and treatments performed thus far and all questions were answered to the best of my ability. The patient demonstrates understanding and agreement with plan.    [PR]    Clinical Course User Index [PR] Merlyn Lot, MD     As part of my medical decision making, I reviewed the following data within the [place] notes reviewed and incorporated, Labs reviewed, notes from prior ED visits and [place] Controlled Substance Database   ____________________________________________   FINAL CLINICAL IMPRESSION(S) / ED DIAGNOSES  Final diagnoses:  Hypertension, unspecified type  Bad headache      NEW MEDICATIONS STARTED DURING THIS VISIT:  New Prescriptions   No medications on file     Note:  This document was prepared using Dragon voice recognition software and may include unintentional dictation errors.    Merlyn Lot, MD 02/04/18 2259

## 2018-02-04 NOTE — ED Notes (Signed)
Pt reporting bilateral arm cramping has worsened. Left sided jaw pain and worsening left basilar headache while sitting in the lobby. No neuro deficits noted at this time. Pt able to answer all questions and speak in complete sentences. Alert and Oriented x 4. Husband sitting with patient.

## 2018-02-04 NOTE — ED Notes (Signed)
Patient transported to CT 

## 2018-02-05 ENCOUNTER — Ambulatory Visit: Payer: Self-pay

## 2018-02-05 NOTE — Telephone Encounter (Signed)
° °  Reason for Disposition  Systolic BP  >= 329 OR Diastolic >= 924  Answer Assessment - Initial Assessment Questions 1. BLOOD PRESSURE: "What is the blood pressure?" "Did you take at least two measurements 5 minutes apart?"     165/112 hr 93 2. ONSET: "When did you take your blood pressure?"    Just now 3. HOW: "How did you obtain the blood pressure?" (e.g., visiting nurse, automatic home BP monitor)     home 4. HISTORY: "Do you have a history of high blood pressure?"     Yes seen in ED yesterday 5. MEDICATIONS: "Are you taking any medications for blood pressure?" "Have you missed any doses recently?"     No Amlodipine 5mg  BID 6. OTHER SYMPTOMS: "Do you have any symptoms?" (e.g., headache, chest pain, blurred vision, difficulty breathing, weakness)     Headache 7. PREGNANCY: "Is there any chance you are pregnant?" "When was your last menstrual period?"     No tubes tied  Protocols used: HIGH BLOOD PRESSURE-A-AH   This encounter was created in error - please disregard.

## 2018-02-05 NOTE — Telephone Encounter (Signed)
Pt called with C/O on going hypertension. She states she has been to the ED twice this week. Yesterday the ED doctors told her to increase her amlodipine for 5mg  daily to 5 mg BID. The ED doctor sugested that she might need a diuretic but she should follow up with her PCP. Her BP now is 165/112 hr 93.  Other BP today was 165/110. She has a slight headache. Per protocol appointment made at [facility]. Care advice read to patient. Patient verbalized understanding of all instructions.   Reason for Disposition . Systolic BP  >= 937 OR Diastolic >= 902  Answer Assessment - Initial Assessment Questions 1. BLOOD PRESSURE: "What is the blood pressure?" "Did you take at least two measurements 5 minutes apart?"     165/112 Hr 93 2. ONSET: "When did you take your blood pressure?"     Just now 3. HOW: "How did you obtain the blood pressure?" (e.g., visiting nurse, automatic home BP monitor)     home 4. HISTORY: "Do you have a history of high blood pressure?"     Yes seen in ED yesterday 5. MEDICATIONS: "Are you taking any medications for blood pressure?" "Have you missed any doses recently?"     No missed doses Amlodipine 5 mg BID 6. OTHER SYMPTOMS: "Do you have any symptoms?" (e.g., headache, chest pain, blurred vision, difficulty breathing, weakness)     Headache but mild 7. PREGNANCY: "Is there any chance you are pregnant?" "When was your last menstrual period?"     tubal  Protocols used: HIGH BLOOD PRESSURE-A-AH

## 2018-02-06 ENCOUNTER — Ambulatory Visit: Payer: 59 | Admitting: Family Medicine

## 2018-02-08 DIAGNOSIS — R079 Chest pain, unspecified: Secondary | ICD-10-CM | POA: Diagnosis not present

## 2018-02-08 DIAGNOSIS — R9431 Abnormal electrocardiogram [ECG] [EKG]: Secondary | ICD-10-CM | POA: Diagnosis not present

## 2018-02-10 ENCOUNTER — Ambulatory Visit (INDEPENDENT_AMBULATORY_CARE_PROVIDER_SITE_OTHER): Payer: 59 | Admitting: Advanced Practice Midwife

## 2018-02-10 ENCOUNTER — Encounter: Payer: Self-pay | Admitting: Advanced Practice Midwife

## 2018-02-10 VITALS — BP 138/88 | HR 88 | Ht 63.0 in | Wt 155.0 lb

## 2018-02-10 DIAGNOSIS — Z124 Encounter for screening for malignant neoplasm of cervix: Secondary | ICD-10-CM | POA: Diagnosis not present

## 2018-02-10 DIAGNOSIS — N898 Other specified noninflammatory disorders of vagina: Secondary | ICD-10-CM

## 2018-02-10 DIAGNOSIS — Z113 Encounter for screening for infections with a predominantly sexual mode of transmission: Secondary | ICD-10-CM | POA: Diagnosis not present

## 2018-02-10 DIAGNOSIS — Z87440 Personal history of urinary (tract) infections: Secondary | ICD-10-CM

## 2018-02-10 DIAGNOSIS — B373 Candidiasis of vulva and vagina: Secondary | ICD-10-CM | POA: Diagnosis not present

## 2018-02-10 DIAGNOSIS — Z1151 Encounter for screening for human papillomavirus (HPV): Secondary | ICD-10-CM

## 2018-02-10 DIAGNOSIS — Z01419 Encounter for gynecological examination (general) (routine) without abnormal findings: Secondary | ICD-10-CM

## 2018-02-10 DIAGNOSIS — B009 Herpesviral infection, unspecified: Secondary | ICD-10-CM

## 2018-02-10 HISTORY — DX: Herpesviral infection, unspecified: B00.9

## 2018-02-10 MED ORDER — VALACYCLOVIR HCL 500 MG PO TABS: 500.0000 mg | ORAL_TABLET | Freq: Every day | ORAL | 12 refills | Status: DC

## 2018-02-10 NOTE — Progress Notes (Signed)
Has issue with an ovarian cyst, and would like a refill of valtrex today also.

## 2018-02-10 NOTE — Patient Instructions (Signed)

## 2018-02-10 NOTE — Progress Notes (Signed)
GYNECOLOGY ANNUAL PREVENTATIVE CARE ENCOUNTER NOTE  Subjective:   Alisha BOJARSKI is a 37 y.o. G58P1102 female here for a routine annual gynecologic exam.  Current complaints: HSV outbreak, requesting refill of Valtrex.   Denies abnormal vaginal bleeding, discharge, pelvic pain, problems with intercourse or other gynecologic concerns. Patient endorses history of uncomplicated UTI followed by kidney stones x 2 within the past three months. She was also recently hospitalized for blood pressure management. Denies urinary symptoms today.   Nonsmoker, lives with husband and two children. Feels safe and supported, no history or concerns for IPV.  Gynecologic History Patient's last menstrual period was 01/26/2018 (exact date). Contraception: tubal ligation 2014 Last Pap: 08/28/2016. Results were: normal with negative HPV Last mammogram: N/A age 89.   Obstetric History OB History  Gravida Para Term Preterm AB Living  2 2 1 1   2   SAB TAB Ectopic Multiple Live Births          2    # Outcome Date GA Lbr Len/2nd Weight Sex Delivery Anes PTL Lv  2 Term 12/19/12 [redacted]w[redacted]d   F CS-LTranv Spinal    1 Preterm 12/11/09 [redacted]w[redacted]d   F CS-LTranv EPI  LIV    Past Medical History:  Diagnosis Date  . Anxiety   . Anxiety and depression   . Hyperlipidemia   . Hypertension     Past Surgical History:  Procedure Laterality Date  . CESAREAN SECTION    . CESAREAN SECTION N/A 12/19/2012   Procedure: Repeat cesarean section with delivery of baby girl at [facility].    Bilateral tubal ligation with filshie clips.;  Surgeon: Donnamae Jude, MD;  Location: [facility];  Service: Obstetrics;  Laterality: N/A;  . CHOLECYSTECTOMY  2010  . COLONOSCOPY     2009  . COLPOSCOPY VULVA W/ BIOPSY     abnormal pap  . ENDOMETRIAL ABLATION N/A 07/17/2015   Procedure: ENDOMETRIAL ABLATION WITH HTA, DILATATION AND CURRATAGE;  Surgeon: Emily Filbert, MD;  Location: [facility];  Service: Gynecology;  Laterality: N/A;  . KNEE ARTHROSCOPY    .  LAPAROSCOPIC LYSIS OF ADHESIONS  07/17/2015   Procedure: LAPAROSCOPIC LYSIS OF ADHESIONS;  Surgeon: Emily Filbert, MD;  Location: [facility];  Service: Gynecology;;  . LAPAROSCOPY N/A 07/17/2015   Procedure: LAPAROSCOPY DIAGNOSTIC;  Surgeon: Emily Filbert, MD;  Location: [facility];  Service: Gynecology;  Laterality: N/A;  . TUBAL LIGATION    . UPPER GI ENDOSCOPY    . WISDOM TOOTH EXTRACTION      Current Outpatient Medications on File Prior to Visit  Medication Sig Dispense Refill  . amLODipine (NORVASC) 10 MG tablet Take 1 tablet (10 mg total) by mouth daily. 30 tablet 5  . buPROPion (WELLBUTRIN XL) 150 MG 24 hr tablet Take 150 mg ER PO qam. 30 tablet 1  . HYDROcodone-acetaminophen (NORCO/VICODIN) 5-325 MG tablet Take 1 tablet by mouth every 6 (six) hours as needed for moderate pain. 10 tablet 0  . ondansetron (ZOFRAN) 4 MG tablet Take 1 tablet (4 mg total) by mouth every 8 (eight) hours as needed for nausea or vomiting. 10 tablet 0  . SUMAtriptan (IMITREX) 100 MG tablet sumatriptan 100 mg tablet PRN    . tamsulosin (FLOMAX) 0.4 MG CAPS capsule Take 1 capsule (0.4 mg total) by mouth daily. 30 capsule 1   No current facility-administered medications on file prior to visit.     Allergies  Allergen Reactions  . Latex Rash  . Reglan [Metoclopramide]  Nausea Only  . Sertraline Rash    Social History:  reports that she has never smoked. She has never used smokeless tobacco. She reports that she drinks alcohol. She reports that she does not use drugs.  Family History  Problem Relation Age of Onset  . Hypertension Mother   . Anuerysm Mother        brain  . Migraines Mother   . Heart disease Father   . Hypertension Father   . Hyperlipidemia Father   . Prostate cancer Father   . Depression Brother   . Multiple sclerosis Maternal Grandmother   . Sudden death Maternal Grandmother   . Heart disease Maternal Grandfather   . Sudden death Maternal Grandfather   . Heart disease Paternal Grandmother   .  Diabetes Paternal Grandmother   . Parkinson's disease Paternal Grandfather     The following portions of the patient's history were reviewed and updated as appropriate: allergies, current medications, past family history, past medical history, past social history, past surgical history and problem list.  Review of Systems Pertinent items noted in HPI and remainder of comprehensive ROS otherwise negative.   Objective:  BP 138/88   Pulse 88   Ht 5\' 3"  (1.6 m)   Wt 155 lb (70.3 kg)   LMP 01/26/2018 (Exact Date) Comment: tubal  BMI 27.46 kg/m  CONSTITUTIONAL: Well-developed, well-nourished female in no acute distress.  HENT:  Normocephalic, atraumatic, External right and left ear normal. Oropharynx is clear and moist EYES: Conjunctivae and EOM are normal. Pupils are equal, round, and reactive to light. No scleral icterus.  NECK: Normal range of motion, supple, no masses.  Normal thyroid.  SKIN: Skin is warm and dry. No rash noted. Not diaphoretic. No erythema. No pallor. MUSCULOSKELETAL: Normal range of motion. No tenderness.  No cyanosis, clubbing, or edema.  2+ distal pulses. NEUROLOGIC: Alert and oriented to person, place, and time. Normal reflexes, muscle tone coordination. No cranial nerve deficit noted. PSYCHIATRIC: Normal mood and affect. Normal behavior. Normal judgment and thought content. CARDIOVASCULAR: Normal heart rate noted, regular rhythm RESPIRATORY: Clear to auscultation bilaterally. Effort and breath sounds normal, no problems with respiration noted. BREASTS: Symmetric in size. No masses, skin changes, nipple drainage, or lymphadenopathy. ABDOMEN: Soft, normal bowel sounds, no distention noted.  LLQ tenderness to light palpation.  No rebound or guarding.  PELVIC: Normal appearing external genitalia; normal appearing vaginal mucosa and cervix.  No abnormal discharge noted.  Pap smear obtained.  Normal uterine size, no other palpable masses, no uterine or adnexal  tenderness.  Afebrile No flank pain Herpetic lesion on lower lip  Assessment and Plan:  1. Well woman exam with routine gynecological exam - No concerns today - Valtrex refilled per patient request - Cytology - PAP  2. History of UTI - LLQ tenderness, hx UTI and kidney stones within past three months - Urine Culture  Will follow up results of pap smear and urine culture and manage accordingly. Routine preventative health maintenance measures emphasized. Please refer to After Visit Summary for other counseling recommendations.    Mallie Snooks, CNM 02/10/18 10:56 AM

## 2018-02-11 ENCOUNTER — Ambulatory Visit (INDEPENDENT_AMBULATORY_CARE_PROVIDER_SITE_OTHER): Payer: 59 | Admitting: Physician Assistant

## 2018-02-11 ENCOUNTER — Encounter: Payer: Self-pay | Admitting: Physician Assistant

## 2018-02-11 VITALS — BP 126/90 | HR 79 | Temp 98.2°F | Ht 63.0 in | Wt 156.4 lb

## 2018-02-11 DIAGNOSIS — I10 Essential (primary) hypertension: Secondary | ICD-10-CM | POA: Diagnosis not present

## 2018-02-11 DIAGNOSIS — R5383 Other fatigue: Secondary | ICD-10-CM

## 2018-02-11 DIAGNOSIS — E559 Vitamin D deficiency, unspecified: Secondary | ICD-10-CM | POA: Diagnosis not present

## 2018-02-11 LAB — VITAMIN D 25 HYDROXY (VIT D DEFICIENCY, FRACTURES): VITD: 21.35 ng/mL — ABNORMAL LOW (ref 30.00–100.00)

## 2018-02-11 LAB — COMPREHENSIVE METABOLIC PANEL
ALK PHOS: 83 U/L (ref 39–117)
ALT: 34 U/L (ref 0–35)
AST: 14 U/L (ref 0–37)
Albumin: 4.4 g/dL (ref 3.5–5.2)
BUN: 12 mg/dL (ref 6–23)
CO2: 30 meq/L (ref 19–32)
Calcium: 9.3 mg/dL (ref 8.4–10.5)
Chloride: 104 mEq/L (ref 96–112)
Creatinine, Ser: 0.62 mg/dL (ref 0.40–1.20)
GFR: 115.85 mL/min (ref >60.00)
GLUCOSE: 93 mg/dL (ref 70–99)
POTASSIUM: 4.6 meq/L (ref 3.5–5.1)
SODIUM: 139 meq/L (ref 135–145)
TOTAL PROTEIN: 6.9 g/dL (ref 6.0–8.3)
Total Bilirubin: 0.5 mg/dL (ref 0.2–1.2)

## 2018-02-11 LAB — VITAMIN B12: VITAMIN B 12: 293 pg/mL (ref 211–911)

## 2018-02-11 LAB — CBC WITH DIFFERENTIAL/PLATELET
BASOS ABS: 0 10*3/uL (ref 0.0–0.1)
Basophils Relative: 0.5 % (ref 0.0–3.0)
EOS PCT: 1 % (ref 0.0–5.0)
Eosinophils Absolute: 0.1 10*3/uL (ref 0.0–0.7)
HCT: 43.7 % (ref 36.0–46.0)
Hemoglobin: 14.4 g/dL (ref 12.0–15.0)
LYMPHS ABS: 1.9 10*3/uL (ref 0.7–4.0)
Lymphocytes Relative: 32.7 % (ref 12.0–46.0)
MCHC: 33 g/dL (ref 30.0–36.0)
MCV: 85.3 fl (ref 78.0–100.0)
MONO ABS: 0.8 10*3/uL (ref 0.1–1.0)
Monocytes Relative: 14.3 % — ABNORMAL HIGH (ref 3.0–12.0)
NEUTROS ABS: 3 10*3/uL (ref 1.4–7.7)
Neutrophils Relative %: 51.5 % (ref 43.0–77.0)
PLATELETS: 288 10*3/uL (ref 150.0–400.0)
RBC: 5.13 Mil/uL — AB (ref 3.87–5.11)
RDW: 13.1 % (ref 11.5–15.5)
WBC: 5.8 10*3/uL (ref 4.0–10.5)

## 2018-02-11 LAB — POCT URINE PREGNANCY: PREG TEST UR: NEGATIVE

## 2018-02-11 LAB — MAGNESIUM: Magnesium: 2.1 mg/dL (ref 1.5–2.5)

## 2018-02-11 LAB — URINE CULTURE

## 2018-02-11 LAB — TSH: TSH: 1.14 u[IU]/mL (ref 0.35–4.50)

## 2018-02-11 MED ORDER — LISINOPRIL 5 MG PO TABS: 5.0000 mg | ORAL_TABLET | Freq: Every day | ORAL | 1 refills | Status: DC

## 2018-02-11 NOTE — Patient Instructions (Signed)
It was great to see you!  Start Lisinopril 5 mg daily.  Decrease Norvasc to 10 mg daily.  Let's follow-up in 2 weeks, sooner if you have concerns.  Take care,  Inda Coke PA-C

## 2018-02-11 NOTE — Progress Notes (Signed)
Alisha Hughes is a 37 y.o. female here for a follow up of a pre-existing problem.   History of Present Illness:   Chief Complaint  Patient presents with  . Follow-up    hypertension    HPI   10/22 -- went to [facility] ER for HTN, HA, diarrhea and chest pain. Labs essentially normal. CT and EKG was essentially normal. She was told that she likely has viral syndrome. Cardiology referral placed. She states that prior to going to the ER she was on 10 mg of Norvasc and was told at the ER to double her Norvasc so started taking 20 mg daily at this point.   10/23 -- went to Cardiology at [facility]. Was told to continue her blood pressure medications. Had stress test and was normal. Was told she did not need cardiology follow-up.  10/24 -- went to [facility] ER for HA and HTN. Labs essentially normal. CT and EKG normal. Was told to follow-up with cardiology vs [place].  The next day, she woke up on Friday and had a terrible headache with dizziness and went home and slept until the afternoon. Dizziness resolved. Had a BP of 156/105 at home. Had confusion but not bad enough to go to ER. Saturday felt better. Continues to have dull headache. Has not taken anything. Eating and drinking well. She does not feel like this is her anxiety, states that she feels like its well controlled. Denies: numbness, tingling, changes in speech, changes in vision.    Past Medical History:  Diagnosis Date  . Anxiety   . Anxiety and depression   . Hyperlipidemia   . Hypertension      Social History   Socioeconomic History  . Marital status: Married    Spouse name: Not on file  . Number of children: Not on file  . Years of education: Not on file  . Highest education level: Not on file  Occupational History  . Not on file  Social Needs  . Financial resource strain: Not on file  . Food insecurity:    Worry: Not on file    Inability: Not on file  . Transportation needs:    Medical: Not on file    Non-medical: Not  on file  Tobacco Use  . Smoking status: Never Smoker  . Smokeless tobacco: Never Used  Substance and Sexual Activity  . Alcohol use: Yes    Alcohol/week: 0.0 standard drinks    Comment: rarely  . Drug use: No  . Sexual activity: Yes    Partners: Male    Birth control/protection: Surgical  Lifestyle  . Physical activity:    Days per week: Not on file    Minutes per session: Not on file  . Stress: Not on file  Relationships  . Social connections:    Talks on phone: Not on file    Gets together: Not on file    Attends religious service: Not on file    Active member of club or organization: Not on file    Attends meetings of clubs or organizations: Not on file    Relationship status: Not on file  . Intimate partner violence:    Fear of current or ex partner: Not on file    Emotionally abused: Not on file    Physically abused: Not on file    Forced sexual activity: Not on file  Other Topics Concern  . 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 CESAREAN SECTION    . CESAREAN SECTION N/A 12/19/2012   Procedure: Repeat cesarean section with delivery of baby girl at [facility].    Bilateral tubal ligation with filshie clips.;  Surgeon: Donnamae Jude, MD;  Location: [facility];  Service: Obstetrics;  Laterality: N/A;  . CHOLECYSTECTOMY  2010  . COLONOSCOPY     2009  . COLPOSCOPY VULVA W/ BIOPSY     abnormal pap  . ENDOMETRIAL ABLATION N/A 07/17/2015   Procedure: ENDOMETRIAL ABLATION WITH HTA, DILATATION AND CURRATAGE;  Surgeon: Emily Filbert, MD;  Location: [facility];  Service: Gynecology;  Laterality: N/A;  . KNEE ARTHROSCOPY    . LAPAROSCOPIC LYSIS OF ADHESIONS  07/17/2015   Procedure: LAPAROSCOPIC LYSIS OF ADHESIONS;  Surgeon: Emily Filbert, MD;   Location: [facility];  Service: Gynecology;;  . LAPAROSCOPY N/A 07/17/2015   Procedure: LAPAROSCOPY DIAGNOSTIC;  Surgeon: Emily Filbert, MD;  Location: [facility];  Service: Gynecology;  Laterality: N/A;  . TUBAL LIGATION    . UPPER GI ENDOSCOPY    . WISDOM TOOTH EXTRACTION      Family History  Problem Relation Age of Onset  . Hypertension Mother   . Anuerysm Mother        brain  . Migraines Mother   . Heart disease Father   . Hypertension Father   . Hyperlipidemia Father   . Prostate cancer Father   . Depression Brother   . Multiple sclerosis Maternal Grandmother   . Sudden death Maternal Grandmother   . Heart disease Maternal Grandfather   . Sudden death Maternal Grandfather   . Heart disease Paternal Grandmother   . Diabetes Paternal Grandmother   . Parkinson's disease Paternal Grandfather     Allergies  Allergen Reactions  . Latex Rash  . Reglan [Metoclopramide] Nausea Only  . Sertraline Rash    Current Medications:   Current Outpatient Medications:  .  amLODipine (NORVASC) 10 MG tablet, Take 1 tablet (10 mg total) by mouth daily., Disp: 30 tablet, Rfl: 5 .  valACYclovir (VALTREX) 500 MG tablet, Take 1 tablet (500 mg total) by mouth daily. Can increase to twice a day for 5 days in the event of a recurrence, Disp: 30 tablet, Rfl: 12 .  buPROPion (WELLBUTRIN XL) 150 MG 24 hr tablet, Take 150 mg ER PO qam. (Patient not taking: Reported on 02/11/2018), Disp: 30 tablet, Rfl: 1 .  HYDROcodone-acetaminophen (NORCO/VICODIN) 5-325 MG tablet, Take 1 tablet by mouth every 6 (six) hours as needed for moderate pain. (Patient not taking: Reported on 02/11/2018), Disp: 10 tablet, Rfl: 0 .  lisinopril (PRINIVIL,ZESTRIL) 5 MG tablet, Take 1 tablet (5 mg total) by mouth daily., Disp: 30 tablet, Rfl: 1 .  ondansetron (ZOFRAN) 4 MG tablet, Take 1 tablet (4 mg total) by mouth every 8 (eight) hours as needed for nausea or vomiting. (Patient not taking: Reported on 02/11/2018), Disp: 10 tablet, Rfl: 0 .   SUMAtriptan (IMITREX) 100 MG tablet, sumatriptan 100 mg tablet PRN, Disp: , Rfl:    Review of Systems:   ROS  Negative unless otherwise specified per HPI.  Vitals:   Vitals:   02/11/18 0946  BP: 126/90  Pulse: 79  Temp: 98.2 F (36.8 C)  TempSrc:  Oral  SpO2: 99%  Weight: 156 lb 6.4 oz (70.9 kg)  Height: 5\' 3"  (1.6 m)     Body mass index is 27.71 kg/m.  Physical Exam:   Physical Exam  Constitutional: She appears well-developed. She is cooperative.  Non-toxic appearance. She does not have a sickly appearance. She does not appear ill. No distress.  Cardiovascular: Normal rate, regular rhythm, S1 normal, S2 normal, normal heart sounds and normal pulses.  No LE edema  Pulmonary/Chest: Effort normal and breath sounds normal.  Neurological: She is alert. GCS eye subscore is 4. GCS verbal subscore is 5. GCS motor subscore is 6.  Skin: Skin is warm, dry and intact.  Psychiatric: She has a normal mood and affect. Her speech is normal and behavior is normal.  Nursing note and vitals reviewed.  Results for orders placed or performed in visit on 02/11/18  TSH  Result Value Ref Range   TSH 1.14 0.35 - 4.50 uIU/mL  Vitamin B12  Result Value Ref Range   Vitamin B-12 293 211 - 911 pg/mL  CBC with Differential/Platelet  Result Value Ref Range   WBC 5.8 4.0 - 10.5 K/uL   RBC 5.13 (H) 3.87 - 5.11 Mil/uL   Hemoglobin 14.4 12.0 - 15.0 g/dL   HCT 43.7 36.0 - 46.0 %   MCV 85.3 78.0 - 100.0 fl   MCHC 33.0 30.0 - 36.0 g/dL   RDW 13.1 11.5 - 15.5 %   Platelets 288.0 150.0 - 400.0 K/uL   Neutrophils Relative % 51.5 43.0 - 77.0 %   Lymphocytes Relative 32.7 12.0 - 46.0 %   Monocytes Relative 14.3 (H) 3.0 - 12.0 %   Eosinophils Relative 1.0 0.0 - 5.0 %   Basophils Relative 0.5 0.0 - 3.0 %   Neutro Abs 3.0 1.4 - 7.7 K/uL   Lymphs Abs 1.9 0.7 - 4.0 K/uL   Monocytes Absolute 0.8 0.1 - 1.0 K/uL   Eosinophils Absolute 0.1 0.0 - 0.7 K/uL   Basophils Absolute 0.0 0.0 - 0.1 K/uL   Comprehensive metabolic panel  Result Value Ref Range   Sodium 139 135 - 145 mEq/L   Potassium 4.6 3.5 - 5.1 mEq/L   Chloride 104 96 - 112 mEq/L   CO2 30 19 - 32 mEq/L   Glucose, Bld 93 70 - 99 mg/dL   BUN 12 6 - 23 mg/dL   Creatinine, Ser 0.62 0.40 - 1.20 mg/dL   Total Bilirubin 0.5 0.2 - 1.2 mg/dL   Alkaline Phosphatase 83 39 - 117 U/L   AST 14 0 - 37 U/L   ALT 34 0 - 35 U/L   Total Protein 6.9 6.0 - 8.3 g/dL   Albumin 4.4 3.5 - 5.2 g/dL   Calcium 9.3 8.4 - 10.5 mg/dL   GFR 115.85 >60.00 mL/min  Magnesium  Result Value Ref Range   Magnesium 2.1 1.5 - 2.5 mg/dL  VITAMIN D 25 Hydroxy (Vit-D Deficiency, Fractures)  Result Value Ref Range   VITD 21.35 (L) 30.00 - 100.00 ng/mL  POCT urine pregnancy  Result Value Ref Range   Preg Test, Ur Negative Negative     Assessment and Plan:    Giana was seen today for follow-up.  Diagnoses and all orders for this visit:  Vitamin D deficiency; Fatigue, unspecified type Re-check vit D today. Remains low, continue oral supplement. Suspect fatigue is likely from the eventful week she just experienced. Her blood pressure and other labs are all relatively normal at this point. Discussed pushing  fluids and resting over the weekend. Follow-up in 2 weeks. Small, frequent meals. -     VITAMIN D 25 Hydroxy (Vit-D Deficiency, Fractures -     TSH -     Vitamin B12 -     CBC with Differential/Platelet -     Comprehensive metabolic panel -     Magnesium -     POCT urine pregnancy  Essential hypertension BP good in office. Unfortunately there seems to be a discrepancy regarding exactly what dosage of medication she is on. We are going to decrease her Norvasc to 10 mg daily (she states that she was told to take 20 mg daily) and we are going to start 5 mg lisinopril. Follow-up in 2 weeks, sooner if symptoms or other concerns develop. -     TSH -     CBC with Differential/Platelet -     Comprehensive metabolic panel  Other orders -      lisinopril (PRINIVIL,ZESTRIL) 5 MG tablet; Take 1 tablet (5 mg total) by mouth daily.  . Reviewed expectations re: course of current medical issues. . Discussed self-management of symptoms. . Outlined signs and symptoms indicating need for more acute intervention. . Patient verbalized understanding and all questions were answered. . See orders for this visit as documented in the electronic medical record. . Patient received an After-Visit Summary.  Inda Coke, PA-C

## 2018-02-12 ENCOUNTER — Encounter: Payer: Self-pay | Admitting: Physician Assistant

## 2018-02-12 LAB — CYTOLOGY - PAP
Candida vaginitis: POSITIVE — AB
Chlamydia: NEGATIVE
Diagnosis: NEGATIVE
HPV (WINDOPATH): NOT DETECTED
NEISSERIA GONORRHEA: NEGATIVE

## 2018-04-12 ENCOUNTER — Encounter: Payer: Self-pay | Admitting: Physician Assistant

## 2018-04-12 ENCOUNTER — Ambulatory Visit (INDEPENDENT_AMBULATORY_CARE_PROVIDER_SITE_OTHER): Payer: 59 | Admitting: Physician Assistant

## 2018-04-12 VITALS — BP 130/86 | HR 88 | Temp 98.1°F | Ht 63.0 in | Wt 160.5 lb

## 2018-04-12 DIAGNOSIS — R6889 Other general symptoms and signs: Secondary | ICD-10-CM | POA: Diagnosis not present

## 2018-04-12 LAB — POC INFLUENZA A&B (BINAX/QUICKVUE)
INFLUENZA B, POC: NEGATIVE
Influenza A, POC: NEGATIVE

## 2018-04-12 MED ORDER — OSELTAMIVIR PHOSPHATE 75 MG PO CAPS: 75.0000 mg | ORAL_CAPSULE | Freq: Every day | ORAL | 0 refills | Status: DC

## 2018-04-12 NOTE — Progress Notes (Signed)
Alisha Hughes is a 37 y.o. female here for a new problem.  I acted as a Education administrator for [employer], PA-C Anselmo Pickler, LPN  History of Present Illness:   Chief Complaint  Patient presents with  . Cough    Cough  This is a new problem. Episode onset: Started 2 days ago. The problem has been gradually worsening. The problem occurs constantly. The cough is non-productive. Associated symptoms include headaches and a sore throat. Pertinent negatives include no chills or fever. The symptoms are aggravated by lying down. Risk factors: daughter Dx 12/25 Flu  Treatments tried: Delsym, Tylenol. The treatment provided mild relief. There is no history of asthma, bronchitis or pneumonia.   One daughter with positive Flu A, one with Flu B.   Past Medical History:  Diagnosis Date  . Anxiety   . Anxiety and depression   . Hyperlipidemia   . Hypertension      Social History   Socioeconomic History  . Marital status: Married    Spouse name: Not on file  . Number of children: Not on file  . Years of education: Not on file  . Highest education level: Not on file  Occupational History  . Not on file  Social Needs  . Financial resource strain: Not on file  . Food insecurity:    Worry: Not on file    Inability: Not on file  . Transportation needs:    Medical: Not on file    Non-medical: Not on file  Tobacco Use  . Smoking status: Never Smoker  . Smokeless tobacco: Never Used  Substance and Sexual Activity  . Alcohol use: Yes    Alcohol/week: 0.0 standard drinks    Comment: rarely  . Drug use: No  . Sexual activity: Yes    Partners: Male    Birth control/protection: Surgical  Lifestyle  . Physical activity:    Days per week: Not on file    Minutes per session: Not on file  . Stress: Not on file  Relationships  . Social connections:    Talks on phone: Not on file    Gets together: Not on file    Attends religious service: Not on file    Active member of club or  organization: Not on file    Attends meetings of clubs or organizations: Not on file    Relationship status: Not on file  . Intimate partner violence:    Fear of current or ex partner: Not on file    Emotionally abused: Not on file    Physically abused: Not on file    Forced sexual activity: Not on file  Other Topics Concern  . 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 CESAREAN SECTION    . CESAREAN SECTION N/A 12/19/2012   Procedure: Repeat cesarean section with delivery of baby girl at [facility].    Bilateral tubal ligation with filshie clips.;  Surgeon: Donnamae Jude, MD;  Location: [facility];  Service: Obstetrics;  Laterality: N/A;  . CHOLECYSTECTOMY  2010  . COLONOSCOPY     2009  . COLPOSCOPY VULVA W/ BIOPSY     abnormal pap  . ENDOMETRIAL ABLATION N/A 07/17/2015  Procedure: ENDOMETRIAL ABLATION WITH HTA, DILATATION AND CURRATAGE;  Surgeon: Emily Filbert, MD;  Location: [facility];  Service: Gynecology;  Laterality: N/A;  . KNEE ARTHROSCOPY    . LAPAROSCOPIC LYSIS OF ADHESIONS  07/17/2015   Procedure: LAPAROSCOPIC LYSIS OF ADHESIONS;  Surgeon: Emily Filbert, MD;  Location: [facility];  Service: Gynecology;;  . LAPAROSCOPY N/A 07/17/2015   Procedure: LAPAROSCOPY DIAGNOSTIC;  Surgeon: Emily Filbert, MD;  Location: [facility];  Service: Gynecology;  Laterality: N/A;  . TUBAL LIGATION    . UPPER GI ENDOSCOPY    . WISDOM TOOTH EXTRACTION      Family History  Problem Relation Age of Onset  . Hypertension Mother   . Anuerysm Mother        brain  . Migraines Mother   . Heart disease Father   . Hypertension Father   . Hyperlipidemia Father   . Prostate cancer Father   . Depression Brother   . Multiple sclerosis Maternal Grandmother   . Sudden death Maternal  Grandmother   . Heart disease Maternal Grandfather   . Sudden death Maternal Grandfather   . Heart disease Paternal Grandmother   . Diabetes Paternal Grandmother   . Parkinson's disease Paternal Grandfather     Allergies  Allergen Reactions  . Latex Rash  . Reglan [Metoclopramide] Nausea Only  . Sertraline Rash    Current Medications:   Current Outpatient Medications:  .  amLODipine (NORVASC) 10 MG tablet, Take 1 tablet (10 mg total) by mouth daily., Disp: 30 tablet, Rfl: 5 .  buPROPion (WELLBUTRIN XL) 150 MG 24 hr tablet, Take 150 mg ER PO qam., Disp: 30 tablet, Rfl: 1 .  lisinopril (PRINIVIL,ZESTRIL) 5 MG tablet, Take 1 tablet (5 mg total) by mouth daily., Disp: 30 tablet, Rfl: 1 .  ondansetron (ZOFRAN) 4 MG tablet, Take 1 tablet (4 mg total) by mouth every 8 (eight) hours as needed for nausea or vomiting., Disp: 10 tablet, Rfl: 0 .  SUMAtriptan (IMITREX) 100 MG tablet, sumatriptan 100 mg tablet PRN, Disp: , Rfl:  .  valACYclovir (VALTREX) 500 MG tablet, Take 1 tablet (500 mg total) by mouth daily. Can increase to twice a day for 5 days in the event of a recurrence, Disp: 30 tablet, Rfl: 12 .  oseltamivir (TAMIFLU) 75 MG capsule, Take 1 capsule (75 mg total) by mouth daily., Disp: 10 capsule, Rfl: 0   Review of Systems:   Review of Systems  Constitutional: Negative for chills and fever.  HENT: Positive for sore throat.   Respiratory: Positive for cough.   Neurological: Positive for headaches.    Vitals:   Vitals:   04/12/18 0942  BP: 130/86  Pulse: 88  Temp: 98.1 F (36.7 C)  TempSrc: Oral  SpO2: 98%  Weight: 160 lb 8 oz (72.8 kg)  Height: 5\' 3"  (1.6 m)     Body mass index is 28.43 kg/m.  Physical Exam:   Physical Exam Vitals signs and nursing note reviewed.  Constitutional:      General: She is not in acute distress.    Appearance: She is well-developed. She is not ill-appearing or toxic-appearing.  HENT:     Head: Normocephalic and atraumatic.     Right  Ear: Tympanic membrane, ear canal and external ear normal. Tympanic membrane is not erythematous, retracted or bulging.     Left Ear: Tympanic membrane, ear canal and external ear normal. Tympanic membrane is not erythematous, retracted or bulging.     Nose: Nose  normal.     Right Sinus: No maxillary sinus tenderness or frontal sinus tenderness.     Left Sinus: No maxillary sinus tenderness or frontal sinus tenderness.     Mouth/Throat:     Pharynx: Uvula midline. Posterior oropharyngeal erythema present.  Eyes:     General: Lids are normal.     Conjunctiva/sclera: Conjunctivae normal.  Neck:     Trachea: Trachea normal.  Cardiovascular:     Rate and Rhythm: Normal rate and regular rhythm.     Heart sounds: Normal heart sounds, S1 normal and S2 normal.  Pulmonary:     Effort: Pulmonary effort is normal.     Breath sounds: Normal breath sounds. No decreased breath sounds, wheezing, rhonchi or rales.  Lymphadenopathy:     Cervical: No cervical adenopathy.  Skin:    General: Skin is warm and dry.  Neurological:     Mental Status: She is alert.  Psychiatric:        Speech: Speech normal.        Behavior: Behavior normal. Behavior is cooperative.     Results for orders placed or performed in visit on 04/12/18  POC Influenza A&B(BINAX/QUICKVUE)  Result Value Ref Range   Influenza A, POC Negative Negative   Influenza B, POC Negative Negative    Assessment and Plan:   Orpha was seen today for cough.  Diagnoses and all orders for this visit:  Flu-like symptoms -     POC Influenza A&B(BINAX/QUICKVUE)  Other orders -     oseltamivir (TAMIFLU) 75 MG capsule; Take 1 capsule (75 mg total) by mouth daily.   No red flags on exam.  Will initiate prophylactic Tamiflu per orders and per patient discussion. Discussed taking medications as prescribed. Reviewed return precautions including worsening fever, SOB, worsening cough or other concerns. Push fluids and rest. I recommend that  patient follow-up if symptoms worsen or persist despite treatment x 7-10 days, sooner if needed.  . Reviewed expectations re: course of current medical issues. . Discussed self-management of symptoms. . Outlined signs and symptoms indicating need for more acute intervention. . Patient verbalized understanding and all questions were answered. . See orders for this visit as documented in the electronic medical record. . Patient received an After-Visit Summary.  CMA or LPN served as scribe during this visit. History, Physical, and Plan performed by medical provider. The above documentation has been reviewed and is accurate and complete.  Inda Coke, PA-C

## 2018-04-12 NOTE — Patient Instructions (Signed)
It was great to see you!  You have a viral upper respiratory infection. Antibiotics are not needed for this.  Viral infections usually take 7-10 days to resolve.  The cough can last a few weeks to go away.  Use medication as prescribed: Tamiflu  Push fluids and get plenty of rest. Please return if you are not improving as expected, or if you have high fevers (>101.5) or difficulty swallowing or worsening productive cough.  Call clinic with questions.  I hope you start feeling better soon!

## 2018-05-04 DIAGNOSIS — M25562 Pain in left knee: Secondary | ICD-10-CM | POA: Diagnosis not present

## 2018-05-08 ENCOUNTER — Other Ambulatory Visit: Payer: Self-pay | Admitting: Physician Assistant

## 2018-05-19 ENCOUNTER — Encounter: Payer: Self-pay | Admitting: Physician Assistant

## 2018-05-19 MED ORDER — LISINOPRIL 5 MG PO TABS: 5.0000 mg | ORAL_TABLET | Freq: Every day | ORAL | 2 refills | Status: DC

## 2018-05-25 DIAGNOSIS — S56912A Strain of unspecified muscles, fascia and tendons at forearm level, left arm, initial encounter: Secondary | ICD-10-CM | POA: Diagnosis not present

## 2018-05-25 DIAGNOSIS — M659 Synovitis and tenosynovitis, unspecified: Secondary | ICD-10-CM | POA: Diagnosis not present

## 2018-05-26 ENCOUNTER — Ambulatory Visit: Payer: Self-pay

## 2018-05-26 DIAGNOSIS — I10 Essential (primary) hypertension: Secondary | ICD-10-CM | POA: Diagnosis not present

## 2018-05-26 DIAGNOSIS — Z91040 Latex allergy status: Secondary | ICD-10-CM | POA: Diagnosis not present

## 2018-05-26 DIAGNOSIS — L03119 Cellulitis of unspecified part of limb: Secondary | ICD-10-CM | POA: Diagnosis not present

## 2018-05-26 DIAGNOSIS — M79642 Pain in left hand: Secondary | ICD-10-CM | POA: Diagnosis not present

## 2018-05-26 DIAGNOSIS — M659 Synovitis and tenosynovitis, unspecified: Secondary | ICD-10-CM | POA: Diagnosis not present

## 2018-05-26 DIAGNOSIS — L03114 Cellulitis of left upper limb: Secondary | ICD-10-CM | POA: Diagnosis not present

## 2018-05-26 NOTE — Telephone Encounter (Signed)
Telephone  Call from  With  Complaint  Of  swollen  Hand  That  Is  Red, swollen, hurts  Moderate to  Severe.   Pain  started  Monday. Patient  Has  A fever. Per protocol  Patient  Should  Go to  ED for  Evaluation.  Patient  Voiced  Understanding.    -  Reason for Disposition . [1] Red area or streak AND [2] fever  Answer Assessment - Initial Assessment Questions 1. ONSET: "When did the pain start?"     Monday 2. LOCATION: "Where is the pain located?"     Middle finger  Cant  Straighten  3. PAIN: "How bad is the pain?" (Scale 1-10; or mild, moderate, severe)   - MILD (1-3): doesn't interfere with normal activities   - MODERATE (4-7): interferes with normal activities (e.g., work or school) or awakens from sleep   - SEVERE (8-10): excruciating pain, unable to use hand at all     severe 4. WORK OR EXERCISE: "Has there been any recent work or exercise that involved this part of the body?"     no 5. CAUSE: "What do you think is causing the pain?"     *No Answer* 6. AGGRAVATING FACTORS: "What makes the pain worse?" (e.g., using computer)     Throbbing  Pain  constant 7. OTHER SYMPTOMS: "Do you have any other symptoms?" (e.g., neck pain, swelling, rash, numbness, fever)     Swelling fever  really 8. PREGNANCY: "Is there any chance you are pregnant?" "When was your last menstrual period?"     Denies n tubal  Protocols used: HAND AND WRIST PAIN-A-AH

## 2018-07-07 ENCOUNTER — Encounter: Payer: Self-pay | Admitting: Physician Assistant

## 2018-07-07 ENCOUNTER — Telehealth: Payer: 59 | Admitting: Family

## 2018-07-07 ENCOUNTER — Ambulatory Visit: Payer: 59 | Admitting: Physician Assistant

## 2018-07-07 DIAGNOSIS — R399 Unspecified symptoms and signs involving the genitourinary system: Secondary | ICD-10-CM | POA: Diagnosis not present

## 2018-07-07 MED ORDER — CEPHALEXIN 500 MG PO CAPS: 500.0000 mg | ORAL_CAPSULE | Freq: Two times a day (BID) | ORAL | 0 refills | Status: DC

## 2018-07-07 NOTE — Progress Notes (Signed)

## 2018-07-07 NOTE — Progress Notes (Deleted)
Virtual Visit via Video   I connected with Alisha Hughes on 07/07/18 at 11:00 AM EDT by a video enabled telemedicine application and verified that I am speaking with the correct person using two identifiers. Location patient: Home Location provider: [facility], Office Persons participating in the virtual visit: [place], Majid, LPN, Inda Coke, PA-C  I discussed the limitations of evaluation and management by telemedicine and the availability of in person appointments. The patient expressed understanding and agreed to proceed.  Subjective:   HPI:   ROS: See pertinent positives and negatives per HPI.  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   Pain in right knee 09/29/2017   Overweight 08/12/2017   Irritable bowel syndrome with diarrhea 07/15/2016   Vitamin D deficiency 07/15/2016   Anxiety and depression 02/11/2016   Essential hypertension 02/08/2013   Chronic migraine without aura 12/07/2012    Social History   Tobacco Use   Smoking status: Never Smoker   Smokeless tobacco: Never Used  Substance Use Topics   Alcohol use: Yes    Alcohol/week: 0.0 standard drinks    Comment: rarely    Current Outpatient Medications:    amLODipine (NORVASC) 10 MG tablet, Take 1 tablet (10 mg total) by mouth daily., Disp: 30 tablet, Rfl: 5   buPROPion (WELLBUTRIN XL) 150 MG 24 hr tablet, Take 150 mg ER PO qam., Disp: 30 tablet, Rfl: 1   cephALEXin (KEFLEX) 500 MG capsule, Take 1 capsule (500 mg total) by mouth 2 (two) times daily., Disp: 14 capsule, Rfl: 0   lisinopril (PRINIVIL,ZESTRIL) 5 MG tablet, Take 1 tablet (5 mg total) by mouth daily., Disp: 30 tablet, Rfl: 2   ondansetron (ZOFRAN) 4 MG tablet, Take 1 tablet (4 mg total) by mouth every 8 (eight) hours as needed for nausea or  vomiting., Disp: 10 tablet, Rfl: 0   oseltamivir (TAMIFLU) 75 MG capsule, Take 1 capsule (75 mg total) by mouth daily., Disp: 10 capsule, Rfl: 0   SUMAtriptan (IMITREX) 100 MG tablet, sumatriptan 100 mg tablet PRN, Disp: , Rfl:    valACYclovir (VALTREX) 500 MG tablet, Take 1 tablet (500 mg total) by mouth daily. Can increase to twice a day for 5 days in the event of a recurrence, Disp: 30 tablet, Rfl: 12  Allergies  Allergen Reactions   Latex Rash   Reglan [Metoclopramide] Nausea Only   Sertraline Rash    Objective:   VITALS: Per patient if applicable, see vitals. GENERAL: Alert, appears well and in no acute distress. HEENT: Atraumatic, conjunctiva clear, no obvious abnormalities on inspection of external nose and ears. NECK: Normal movements of the head and neck. CARDIOPULMONARY: No increased WOB. Speaking in clear sentences. I:E ratio WNL.  MS: Moves all visible extremities without noticeable abnormality. PSYCH: Pleasant and cooperative, well-groomed. Speech normal rate and rhythm. Affect is appropriate. Insight and judgement are appropriate. Attention is focused, linear, and appropriate.  NEURO: CN grossly intact. Oriented as arrived to appointment on time with no prompting. Moves both UE equally.  SKIN: No obvious lesions, wounds, erythema, or cyanosis noted on face or hands.  Assessment and Plan:   There are no diagnoses linked to this encounter.   Reviewed expectations re: course of current medical issues.  Discussed self-management of symptoms.  Outlined signs and symptoms indicating need for more acute intervention.  Patient verbalized understanding and all questions were answered.  Health Maintenance issues including appropriate healthy diet, exercise, and smoking avoidance were discussed with patient.  See orders for this visit as documented in the electronic medical record.  Anselmo Pickler, LPN 3/76/2831

## 2018-07-14 ENCOUNTER — Telehealth: Payer: Self-pay | Admitting: Physician Assistant

## 2018-07-14 NOTE — Telephone Encounter (Signed)
Copied from [place] [id number]. Topic: General - Other >> Jul 14, 2018  4:30 PM Wynetta Emery, [place] C wrote: Reason for CRM: pt says that she was seen for UTI. Pt says that she completed the medication and now is having lower back and side pain that feels like kidney stones. Pt says that every time she have a UTI it causes kidney stones. Pt says that she doesn't want to go to an UC. Pt would like to be advised by her provider on if another apt is needed? If so, please assist.

## 2018-07-15 ENCOUNTER — Other Ambulatory Visit (INDEPENDENT_AMBULATORY_CARE_PROVIDER_SITE_OTHER): Payer: 59

## 2018-07-15 ENCOUNTER — Other Ambulatory Visit: Payer: Self-pay

## 2018-07-15 ENCOUNTER — Encounter: Payer: Self-pay | Admitting: Physician Assistant

## 2018-07-15 ENCOUNTER — Other Ambulatory Visit: Payer: 59

## 2018-07-15 ENCOUNTER — Ambulatory Visit (INDEPENDENT_AMBULATORY_CARE_PROVIDER_SITE_OTHER): Payer: 59 | Admitting: Physician Assistant

## 2018-07-15 ENCOUNTER — Telehealth: Payer: Self-pay | Admitting: *Deleted

## 2018-07-15 VITALS — BP 150/99

## 2018-07-15 DIAGNOSIS — R109 Unspecified abdominal pain: Secondary | ICD-10-CM

## 2018-07-15 DIAGNOSIS — I10 Essential (primary) hypertension: Secondary | ICD-10-CM | POA: Diagnosis not present

## 2018-07-15 DIAGNOSIS — R300 Dysuria: Secondary | ICD-10-CM | POA: Diagnosis not present

## 2018-07-15 LAB — POCT URINALYSIS DIPSTICK
Bilirubin, UA: NEGATIVE
Glucose, UA: NEGATIVE
Ketones, UA: NEGATIVE
Nitrite, UA: NEGATIVE
Protein, UA: NEGATIVE
Spec Grav, UA: 1.015 (ref 1.010–1.025)
Urobilinogen, UA: 0.2 E.U./dL
pH, UA: 6 (ref 5.0–8.0)

## 2018-07-15 MED ORDER — CIPROFLOXACIN HCL 500 MG PO TABS: 500.0000 mg | ORAL_TABLET | Freq: Two times a day (BID) | ORAL | 0 refills | Status: AC

## 2018-07-15 MED ORDER — HYDROCHLOROTHIAZIDE 12.5 MG PO CAPS: 12.5000 mg | ORAL_CAPSULE | Freq: Every day | ORAL | 1 refills | Status: DC

## 2018-07-15 MED ORDER — TAMSULOSIN HCL 0.4 MG PO CAPS: 0.4000 mg | ORAL_CAPSULE | Freq: Every day | ORAL | 0 refills | Status: DC

## 2018-07-15 NOTE — Addendum Note (Signed)
Addended by: Arby Barrette on: 07/15/2018 02:56 PM   Modules accepted: Orders

## 2018-07-15 NOTE — Telephone Encounter (Signed)
Pt called back asked her if she could go to [place] office prior to Webex to give [place] a urine specimen? Pt said yes. Told her okay I will put order in and you can go now and we will talk later at [facility]. Pt verbalized understanding. Orders put in [place].

## 2018-07-15 NOTE — Progress Notes (Signed)
Virtual Visit via Video   I connected with Alisha Hughes on 07/15/18 at  2:20 PM EDT by a video enabled telemedicine application and verified that I am speaking with the correct person using two identifiers. Location patient: Home Location provider: [facility], Office Persons participating in the virtual visit: [place], [place], [place]   I discussed the limitations of evaluation and management by telemedicine and the availability of in person appointments. The patient expressed understanding and agreed to proceed.  Subjective:  I acted as a Education administrator for [employer], PA-C [employer], LPN  HPI:  Lower back and side pain Pt did an e-visit on 3/25 for UTI, was Tx with Keflex 500 mg BID x 7 days. Pt said she completed the antibiotic course yesterday, but started 2 days ago having high back pain and Left side pain, now low back pain and Bilateral Flank pain and concerned maybe kidney stone. Pt denies fever but is feeling flushed, hot in her face. Blood pressure has been elevated 170/120 last week, rechecked today and was 150/99.  Denies nausea, palpitations.  ROS: See pertinent positives and negatives per HPI.  Patient Active Problem List   Diagnosis Date Noted  . HSV (herpes simplex virus) infection 02/10/2018  . Hyperlipidemia 02/03/2018  . Abnormal ECG 02/03/2018  . 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 Pain in right knee 09/29/2017  . Overweight 08/12/2017  . Irritable bowel syndrome with diarrhea 07/15/2016  . Vitamin D deficiency 07/15/2016  . Anxiety and depression 02/11/2016  . Essential hypertension 02/08/2013  . Chronic migraine without aura 12/07/2012    Social History   Tobacco Use  . Smoking status: Never Smoker  . Smokeless tobacco: Never Used  Substance Use Topics  . Alcohol use: Yes    Alcohol/week: 0.0 standard drinks    Comment: rarely    Current Outpatient Medications:  .  amLODipine (NORVASC) 10 MG tablet, Take 1 tablet (10  mg total) by mouth daily., Disp: 30 tablet, Rfl: 5 .  buPROPion (WELLBUTRIN XL) 150 MG 24 hr tablet, Take 150 mg ER PO qam., Disp: 30 tablet, Rfl: 1 .  ciprofloxacin (CIPRO) 500 MG tablet, Take 1 tablet (500 mg total) by mouth 2 (two) times daily for 10 days., Disp: 20 tablet, Rfl: 0 .  hydrochlorothiazide (MICROZIDE) 12.5 MG capsule, Take 1 capsule (12.5 mg total) by mouth daily., Disp: 30 capsule, Rfl: 1 .  lisinopril (PRINIVIL,ZESTRIL) 5 MG tablet, Take 1 tablet (5 mg total) by mouth daily., Disp: 30 tablet, Rfl: 2 .  ondansetron (ZOFRAN) 4 MG tablet, Take 1 tablet (4 mg total) by mouth every 8 (eight) hours as needed for nausea or vomiting., Disp: 10 tablet, Rfl: 0 .  SUMAtriptan (IMITREX) 100 MG tablet, sumatriptan 100 mg tablet PRN, Disp: , Rfl:  .  tamsulosin (FLOMAX) 0.4 MG CAPS capsule, Take 1 capsule (0.4 mg total) by mouth daily., Disp: 30 capsule, Rfl: 0 .  valACYclovir (VALTREX) 500 MG tablet, Take 1 tablet (500 mg total) by mouth daily. Can increase to twice a day for 5 days in the event of a recurrence, Disp: 30 tablet, Rfl: 12  Allergies  Allergen Reactions  . Latex Rash  . Reglan [Metoclopramide] Nausea Only  . Sertraline Rash    Objective:   VITALS: Per patient if applicable, see vitals. GENERAL: Alert, appears well and in no acute distress. HEENT: Atraumatic, conjunctiva clear, no obvious abnormalities on inspection of external nose and ears. NECK: Normal movements of the  head and neck. CARDIOPULMONARY: No increased WOB. Speaking in clear sentences. I:E ratio WNL.  MS: Moves all visible extremities without noticeable abnormality. PSYCH: Pleasant and cooperative, well-groomed. Speech normal rate and rhythm. Affect is appropriate. Insight and judgement are appropriate. Attention is focused, linear, and appropriate.  NEURO: CN grossly intact. Oriented as arrived to appointment on time with no prompting. Moves both UE equally.  SKIN: No obvious lesions, wounds, erythema, or  cyanosis noted on face or hands.  Assessment and Plan:   Renda was seen today for back pain.  Diagnoses and all orders for this visit:  Flank pain Suspect stone vs pyelo. Start flomax and cipro. Await urine culture results. Low threshold for ER. -     CBC with Differential/Platelet; Future -     Comprehensive metabolic panel; Future  Essential hypertension Uncontrolled. CMP today. If lytes stable, we will add HCTZ 12.5 mg. Follow-up in 2 weeks for re-eval.  Other orders -     tamsulosin (FLOMAX) 0.4 MG CAPS capsule; Take 1 capsule (0.4 mg total) by mouth daily. -     ciprofloxacin (CIPRO) 500 MG tablet; Take 1 tablet (500 mg total) by mouth 2 (two) times daily for 10 days. -     hydrochlorothiazide (MICROZIDE) 12.5 MG capsule; Take 1 capsule (12.5 mg total) by mouth daily.    . Reviewed expectations re: course of current medical issues. . Discussed self-management of symptoms. . Outlined signs and symptoms indicating need for more acute intervention. . Patient verbalized understanding and all questions were answered. Marland Kitchen Health Maintenance issues including appropriate healthy diet, exercise, and smoking avoidance were discussed with patient. . See orders for this visit as documented in the electronic medical record.  [place], [place] 07/15/2018

## 2018-07-15 NOTE — Telephone Encounter (Signed)
Please call pt and schedule Webex today with [facility].

## 2018-07-15 NOTE — Telephone Encounter (Signed)
Left message on voicemail to call office and sent My Chart message.

## 2018-07-16 LAB — COMPREHENSIVE METABOLIC PANEL
ALT: 12 U/L (ref 0–35)
AST: 16 U/L (ref 0–37)
Albumin: 4 g/dL (ref 3.5–5.2)
Alkaline Phosphatase: 69 U/L (ref 39–117)
BUN: 9 mg/dL (ref 6–23)
CO2: 28 mEq/L (ref 19–32)
Calcium: 9.2 mg/dL (ref 8.4–10.5)
Chloride: 104 mEq/L (ref 96–112)
Creatinine, Ser: 0.78 mg/dL (ref 0.40–1.20)
GFR: 83.43 mL/min (ref >60.00)
Glucose, Bld: 92 mg/dL (ref 70–99)
Potassium: 4.3 mEq/L (ref 3.5–5.1)
Sodium: 139 mEq/L (ref 135–145)
Total Bilirubin: 0.4 mg/dL (ref 0.2–1.2)
Total Protein: 6.7 g/dL (ref 6.0–8.3)

## 2018-07-16 LAB — CBC WITH DIFFERENTIAL/PLATELET
Basophils Absolute: 0.1 10*3/uL (ref 0.0–0.1)
Basophils Relative: 1 % (ref 0.0–3.0)
Eosinophils Absolute: 0.1 10*3/uL (ref 0.0–0.7)
Eosinophils Relative: 1 % (ref 0.0–5.0)
HCT: 41.4 % (ref 36.0–46.0)
Hemoglobin: 13.8 g/dL (ref 12.0–15.0)
Lymphocytes Relative: 35.3 % (ref 12.0–46.0)
Lymphs Abs: 2.5 10*3/uL (ref 0.7–4.0)
MCHC: 33.4 g/dL (ref 30.0–36.0)
MCV: 85 fl (ref 78.0–100.0)
Monocytes Absolute: 0.7 10*3/uL (ref 0.1–1.0)
Monocytes Relative: 10.2 % (ref 3.0–12.0)
Neutro Abs: 3.7 10*3/uL (ref 1.4–7.7)
Neutrophils Relative %: 52.5 % (ref 43.0–77.0)
Platelets: 264 10*3/uL (ref 150.0–400.0)
RBC: 4.86 Mil/uL (ref 3.87–5.11)
RDW: 13.4 % (ref 11.5–15.5)
WBC: 7 10*3/uL (ref 4.0–10.5)

## 2018-07-16 LAB — URINE CULTURE
MICRO NUMBER:: 371283
Result:: NO GROWTH
SPECIMEN QUALITY:: ADEQUATE

## 2018-08-13 ENCOUNTER — Other Ambulatory Visit: Payer: Self-pay

## 2018-08-13 ENCOUNTER — Ambulatory Visit: Payer: 59 | Admitting: Physician Assistant

## 2018-08-13 ENCOUNTER — Other Ambulatory Visit (INDEPENDENT_AMBULATORY_CARE_PROVIDER_SITE_OTHER): Payer: 59

## 2018-08-13 ENCOUNTER — Ambulatory Visit (INDEPENDENT_AMBULATORY_CARE_PROVIDER_SITE_OTHER): Payer: 59 | Admitting: Family Medicine

## 2018-08-13 VITALS — BP 130/88

## 2018-08-13 DIAGNOSIS — I10 Essential (primary) hypertension: Secondary | ICD-10-CM

## 2018-08-13 LAB — TSH: TSH: 1.17 u[IU]/mL (ref 0.35–4.50)

## 2018-08-13 NOTE — Progress Notes (Signed)
    Chief Complaint:  Alisha Hughes is a 38 y.o. female who presents today for a virtual office visit with a chief complaint of hypertension.   Assessment/Plan:  Hypertension Blood pressure seems to be improved today compared to yesterday.  It is unclear why she is episodic spikes in blood pressure.  Given her relatively young age in addition to malignant hypertension, I think it is reasonable to pursue other causes for hypertension at this point.  We will check plasma renin aldosterone ratio also rule out pheochromocytoma with urine catecholamines and metanephrines.  Advised her to start HCTZ as directed by her PCP.  She will continue taking lisinopril and Norvasc as well.  Continue home blood pressure monitoring with goal 140/90 or lower.  May need evaluation for renal artery stenosis if above negative and continues to have uncontrolled/resistant hypertension.    Subjective:  HPI:  Hypertension Patient with several year history of elevated blood pressure readings.  She had previously been on Norvasc 10 mg daily.  She is also taking lisinopril 5 mg daily.  Several weeks ago started noticing increasing episodes of elevated blood pressure readings associated with symptoms including left arm pain, flushing, abdominal pain, stomach cramps, and diarrhea.  During these episodes her blood pressure can be as elevated as 190/120.  Sometimes he is episodes will last for a few hours and sometimes it would last for a few days.  She had a similar episode yesterday.  Blood pressure at that time was 190/120.  She has had some associated left side tingling in the past however does not have any current chest pain or shortness of breath.  Has had a cardiac work-up in the past which was essentially negative.  She has never been evaluated by her any other secondary causes for hypertension.  ROS: Per HPI  PMH: She reports that she has never smoked. She has never used smokeless tobacco. She reports current alcohol  use. She reports that she does not use drugs.      Objective/Observations  Physical Exam: Gen: NAD, resting comfortably Pulm: Normal work of breathing Neuro: Grossly normal, moves all extremities Psych: Normal affect and thought content  Virtual Visit via Video   I connected with LEXIA VANDEVENDER on 08/13/18 at 10:00 AM EDT by a video enabled telemedicine application and verified that I am speaking with the correct person using two identifiers. I discussed the limitations of evaluation and management by telemedicine and the availability of in person appointments. The patient expressed understanding and agreed to proceed.   Patient location: Home Provider location: [place] participating in the virtual visit: Myself and Patient     Algis Greenhouse. Jerline Pain, MD 08/13/2018 11:34 AM

## 2018-08-13 NOTE — Addendum Note (Signed)
Addended by: Vivi Barrack on: 08/13/2018 11:35 AM   Modules accepted: Level of Service

## 2018-08-15 ENCOUNTER — Encounter: Payer: Self-pay | Admitting: Family Medicine

## 2018-08-16 ENCOUNTER — Other Ambulatory Visit: Payer: 59

## 2018-08-16 ENCOUNTER — Other Ambulatory Visit: Payer: Self-pay

## 2018-08-16 DIAGNOSIS — I10 Essential (primary) hypertension: Secondary | ICD-10-CM

## 2018-08-19 LAB — ALDOSTERONE + RENIN ACTIVITY W/ RATIO
ALDO / PRA Ratio: 0.4 Ratio — ABNORMAL LOW (ref 0.9–28.9)
Aldosterone: 19 ng/dL
Renin Activity: 44.88 ng/mL/h — ABNORMAL HIGH (ref 0.25–5.82)

## 2018-08-21 LAB — METANEPHRINES, URINE, 24 HOUR
Metaneph Total, Ur: 179 mcg/24 h (ref 115–695)
Metanephrines, Ur: 48 mcg/24 h (ref 36–190)
Normetanephrine, 24H Ur: 131 mcg/24 h (ref 35–482)
Volume, Urine-VMAUR: 1000 mL

## 2018-08-21 LAB — CATECHOLAMINES, FRACTIONATED, URINE, 24 HOUR
Calculated Total (E+NE): 35 mcg/24 h (ref 26–121)
Creatinine, Urine mg/day-CATEUR: 1 g/(24.h) (ref 0.50–2.15)
Dopamine, 24 hr Urine: 152 mcg/24 h (ref 52–480)
Norepinephrine, 24 hr Ur: 35 mcg/24 h (ref 15–100)
Volume, Urine-VMAUR: 1000 mL

## 2018-08-24 NOTE — Progress Notes (Signed)
Please inform patient of the following:  Her blood work and urine samples were all NORMAL. We did not detect any hormonal reason for her elevated blood pressure readings. We do not need to do any further testing at this time. Would like for her to continue her meds as prescribed and follow up with PCP soon for BP check.  Alisha Hughes. Jerline Pain, MD 08/24/2018 3:23 PM

## 2018-08-25 ENCOUNTER — Ambulatory Visit (INDEPENDENT_AMBULATORY_CARE_PROVIDER_SITE_OTHER): Payer: 59 | Admitting: Physician Assistant

## 2018-08-25 ENCOUNTER — Encounter: Payer: Self-pay | Admitting: Physician Assistant

## 2018-08-25 ENCOUNTER — Other Ambulatory Visit: Payer: Self-pay

## 2018-08-25 VITALS — BP 152/105

## 2018-08-25 DIAGNOSIS — I10 Essential (primary) hypertension: Secondary | ICD-10-CM | POA: Diagnosis not present

## 2018-08-25 MED ORDER — LISINOPRIL-HYDROCHLOROTHIAZIDE 20-12.5 MG PO TABS: 1.0000 | ORAL_TABLET | Freq: Every day | ORAL | 1 refills | Status: DC

## 2018-08-25 NOTE — Progress Notes (Signed)
Virtual Visit via Video   I connected with Alisha Hughes on 08/25/18 at  9:00 AM EDT by a video enabled telemedicine application and verified that I am speaking with the correct person using two identifiers. Location patient: Home Location provider: [employer], Office Persons participating in the virtual visit: CLARANN HELVEY, Inda Coke, [place], Anselmo Pickler, LPN  I discussed the limitations of evaluation and management by telemedicine and the availability of in person appointments. The patient expressed understanding and agreed to proceed.  I acted as a Education administrator for [employer], PA-C [employer], LPN  Subjective:   HPI:  HTN Currently taking Norvasc 10 mg, HCTZ 12.5 mg, Lisinopril 5 mg. At home blood pressure readings are: 152/105, 140/90. Patient denies chest pain, SOB, blurred vision, dizziness,.Patient is compliant with medication. Denies excessive caffeine intake, stimulant usage, excessive alcohol intake, or increase in salt consumption. She does report LE swelling with increased walking. She also endorses ongoing headaches when readings uncontrolled.  She was seen by Dr. Dimas Chyle on 08/13/2018 for HTN and a secondary HTN work-up was performed, labs were normal including:   Results for orders placed or performed in visit on 08/16/18  Catecholamines, fractionated, Urine, 24 hour  Result Value Ref Range   Volume, Urine-VMAUR 1,000 mL   Epinephrine, 24 hr Urine see note    Norepinephrine, 24 hr Ur 35 15 - 100 mcg/24 h   Calculated Total (E+NE) 35 26 - 121 mcg/24 h   Dopamine, 24 hr Urine 152 52 - 480 mcg/24 h   Creatinine, Urine mg/day-CATEUR 1.00 0.50 - 2.15 g/24 h  Metanephrines, Urine, 24 hour  Result Value Ref Range   Volume, Urine-VMAUR 1,000 mL   Metanephrines, Ur 48 36 - 190 mcg/24 h   Normetanephrine, 24H Ur 131 35 - 482 mcg/24 h   Metaneph Total, Ur 179 115 - 695 mcg/24 h     ROS: See pertinent positives and negatives per HPI.  Patient Active  Problem List   Diagnosis Date Noted  . HSV (herpes simplex virus) infection 02/10/2018  . Hyperlipidemia 02/03/2018  . Abnormal ECG 02/03/2018  . 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 Pain in right knee 09/29/2017  . Overweight 08/12/2017  . Irritable bowel syndrome with diarrhea 07/15/2016  . Vitamin D deficiency 07/15/2016  . Anxiety and depression 02/11/2016  . Essential hypertension 02/08/2013  . Chronic migraine without aura 12/07/2012    Social History   Tobacco Use  . Smoking status: Never Smoker  . Smokeless tobacco: Never Used  Substance Use Topics  . Alcohol use: Yes    Alcohol/week: 0.0 standard drinks    Comment: rarely    Current Outpatient Medications:  .  amLODipine (NORVASC) 10 MG tablet, Take 1 tablet (10 mg total) by mouth daily., Disp: 30 tablet, Rfl: 5 .  buPROPion (WELLBUTRIN XL) 150 MG 24 hr tablet, Take 150 mg ER PO qam., Disp: 30 tablet, Rfl: 1 .  hydrochlorothiazide (MICROZIDE) 12.5 MG capsule, Take 1 capsule (12.5 mg total) by mouth daily., Disp: 30 capsule, Rfl: 1 .  lisinopril (PRINIVIL,ZESTRIL) 5 MG tablet, Take 1 tablet (5 mg total) by mouth daily., Disp: 30 tablet, Rfl: 2 .  ondansetron (ZOFRAN) 4 MG tablet, Take 1 tablet (4 mg total) by mouth every 8 (eight) hours as needed for nausea or vomiting., Disp: 10 tablet, Rfl: 0 .  SUMAtriptan (IMITREX) 100 MG tablet, sumatriptan 100 mg tablet PRN, Disp: , Rfl:  .  valACYclovir (VALTREX) 500 MG tablet,  Take 1 tablet (500 mg total) by mouth daily. Can increase to twice a day for 5 days in the event of a recurrence, Disp: 30 tablet, Rfl: 12 .  lisinopril-hydrochlorothiazide (ZESTORETIC) 20-12.5 MG tablet, Take 1 tablet by mouth daily., Disp: 30 tablet, Rfl: 1 .  tamsulosin (FLOMAX) 0.4 MG CAPS capsule, Take 1 capsule (0.4 mg total) by mouth daily. (Patient not taking: Reported on 08/25/2018), Disp: 30 capsule, Rfl: 0  Allergies  Allergen Reactions  . Latex Rash  . Reglan [Metoclopramide] Nausea  Only  . Sertraline Rash    Objective:   VITALS: Per patient if applicable, see vitals. GENERAL: Alert, appears well and in no acute distress. HEENT: Atraumatic, conjunctiva clear, no obvious abnormalities on inspection of external nose and ears. NECK: Normal movements of the head and neck. CARDIOPULMONARY: No increased WOB. Speaking in clear sentences. I:E ratio WNL.  MS: Moves all visible extremities without noticeable abnormality. PSYCH: Pleasant and cooperative, well-groomed. Speech normal rate and rhythm. Affect is appropriate. Insight and judgement are appropriate. Attention is focused, linear, and appropriate.  NEURO: CN grossly intact. Oriented as arrived to appointment on time with no prompting. Moves both UE equally.  SKIN: No obvious lesions, wounds, erythema, or cyanosis noted on face or hands.  Assessment and Plan:   Essential hypertension Uncontrolled. Will increase Lisinopril and change to combo form of Lisinopril 20 mg + HCTZ 12.5 mg. Continue Norvasc 10 mg. Due to ongoing labile pressures, will start referral to nephrology for further evaluation and treatment. Follow-up with [place] in 1 month if she has not seen renal by then, sooner if issues.   . Reviewed expectations re: course of current medical issues. . Discussed self-management of symptoms. . Outlined signs and symptoms indicating need for more acute intervention. . Patient verbalized understanding and all questions were answered. Marland Kitchen Health Maintenance issues including appropriate healthy diet, exercise, and smoking avoidance were discussed with patient. . See orders for this visit as documented in the electronic medical record.  I discussed the assessment and treatment plan with the patient. The patient was provided an opportunity to ask questions and all were answered. The patient agreed with the plan and demonstrated an understanding of the instructions.   The patient was advised to call back or seek an in-person  evaluation if the symptoms worsen or if the condition fails to improve as anticipated.    [place], [place] 08/25/2018

## 2018-08-25 NOTE — Assessment & Plan Note (Addendum)
Uncontrolled. Will increase Lisinopril and change to combo form of Lisinopril 20 mg + HCTZ 12.5 mg. Continue Norvasc 10 mg. Due to ongoing labile pressures, will start referral to nephrology for further evaluation and treatment. Follow-up with [place] in 1 month if she has not seen renal by then, sooner if issues.

## 2018-09-13 ENCOUNTER — Encounter: Payer: Self-pay | Admitting: Physician Assistant

## 2018-09-13 ENCOUNTER — Other Ambulatory Visit: Payer: Self-pay

## 2018-09-13 ENCOUNTER — Other Ambulatory Visit: Payer: Self-pay | Admitting: Physical Therapy

## 2018-09-13 ENCOUNTER — Other Ambulatory Visit (INDEPENDENT_AMBULATORY_CARE_PROVIDER_SITE_OTHER): Payer: 59

## 2018-09-13 ENCOUNTER — Ambulatory Visit (INDEPENDENT_AMBULATORY_CARE_PROVIDER_SITE_OTHER): Payer: 59 | Admitting: Physician Assistant

## 2018-09-13 DIAGNOSIS — R399 Unspecified symptoms and signs involving the genitourinary system: Secondary | ICD-10-CM

## 2018-09-13 DIAGNOSIS — N3090 Cystitis, unspecified without hematuria: Secondary | ICD-10-CM

## 2018-09-13 LAB — POCT URINALYSIS DIPSTICK
Bilirubin, UA: NEGATIVE
Glucose, UA: NEGATIVE
Ketones, UA: NEGATIVE
Nitrite, UA: NEGATIVE
Protein, UA: POSITIVE — AB
Spec Grav, UA: 1.02 (ref 1.010–1.025)
Urobilinogen, UA: 0.2 E.U./dL
pH, UA: 7 (ref 5.0–8.0)

## 2018-09-13 MED ORDER — CEPHALEXIN 500 MG PO CAPS: 500.0000 mg | ORAL_CAPSULE | Freq: Three times a day (TID) | ORAL | 0 refills | Status: DC

## 2018-09-13 MED ORDER — NITROFURANTOIN MONOHYD MACRO 100 MG PO CAPS: 100.0000 mg | ORAL_CAPSULE | ORAL | 0 refills | Status: DC | PRN

## 2018-09-13 NOTE — Progress Notes (Signed)
Virtual Visit via Video   I connected with Alisha Hughes on 09/13/18 at  2:40 PM EDT by a video enabled telemedicine application and verified that I am speaking with the correct person using two identifiers. Location patient: Home Location provider: [facility], Office Persons participating in the virtual visit: Alisha Hughes, [place] PA-C, Alisha Forehand RN  I discussed the limitations of evaluation and management by telemedicine and the availability of in person appointments. The patient expressed understanding and agreed to proceed.  I,Alisha Hughes,acting as a scribe for [employer], PA.,have documented all relevant documentation on the behalf of Alisha Coke, PA,as directed by  Alisha Coke, PA while in the presence of Alisha Hughes, [place].  Subjective:   HPI:   Dysuria: Pt called in today c/o difficulty urinating. Woke up this morning having to use the bathroom but nothing coming out. Pt c/o foul smelling urine, back pain. She is currently taking Motrin 400 mg for her pain. She is currently on her period. Denies: nausea, vomiting, chills, fever. She endorses 2-3 UTI in the past 6 months. Often occurs after sexual intercourse.  Patient's last menstrual period was 09/11/2018 (exact date).   ROS: See pertinent positives and negatives per HPI.  Patient Active Problem List   Diagnosis Date Noted  . HSV (herpes simplex virus) infection 02/10/2018  . Hyperlipidemia 02/03/2018  . Abnormal ECG 02/03/2018  . 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 Pain in right knee 09/29/2017  . Overweight 08/12/2017  . Irritable bowel syndrome with diarrhea 07/15/2016  . Vitamin D deficiency 07/15/2016  . Anxiety and depression 02/11/2016  . Essential hypertension 02/08/2013  . Chronic migraine without aura 12/07/2012    Social History   Tobacco Use  . Smoking status: Never Smoker  . Smokeless tobacco: Never Used  Substance Use Topics  . Alcohol use: Yes   Alcohol/week: 0.0 standard drinks    Comment: rarely    Current Outpatient Medications:  .  amLODipine (NORVASC) 10 MG tablet, Take 1 tablet (10 mg total) by mouth daily., Disp: 30 tablet, Rfl: 5 .  buPROPion (WELLBUTRIN XL) 150 MG 24 hr tablet, Take 150 mg ER PO qam., Disp: 30 tablet, Rfl: 1 .  lisinopril-hydrochlorothiazide (ZESTORETIC) 20-12.5 MG tablet, Take 1 tablet by mouth daily., Disp: 30 tablet, Rfl: 1 .  tamsulosin (FLOMAX) 0.4 MG CAPS capsule, Take 1 capsule (0.4 mg total) by mouth daily., Disp: 30 capsule, Rfl: 0 .  valACYclovir (VALTREX) 500 MG tablet, Take 1 tablet (500 mg total) by mouth daily. Can increase to twice a day for 5 days in the event of a recurrence, Disp: 30 tablet, Rfl: 12 .  cephALEXin (KEFLEX) 500 MG capsule, Take 1 capsule (500 mg total) by mouth 3 (three) times daily., Disp: 15 capsule, Rfl: 0 .  nitrofurantoin, macrocrystal-monohydrate, (MACROBID) 100 MG capsule, Take 1 capsule (100 mg total) by mouth as needed (after sexual intercourse)., Disp: 30 capsule, Rfl: 0 .  SUMAtriptan (IMITREX) 100 MG tablet, sumatriptan 100 mg tablet PRN, Disp: , Rfl:   Allergies  Allergen Reactions  . Latex Rash  . Reglan [Metoclopramide] Nausea Only  . Sertraline Rash    Objective:   VITALS: Per patient if applicable, see vitals. GENERAL: Alert, appears well and in no acute distress. HEENT: Atraumatic, conjunctiva clear, no obvious abnormalities on inspection of external nose and ears. NECK: Normal movements of the head and neck. CARDIOPULMONARY: No increased WOB. Speaking in clear sentences. I:E ratio WNL.  MS: Moves all  visible extremities without noticeable abnormality. PSYCH: Pleasant and cooperative, well-groomed. Speech normal rate and rhythm. Affect is appropriate. Insight and judgement are appropriate. Attention is focused, linear, and appropriate.  NEURO: CN grossly intact. Oriented as arrived to appointment on time with no prompting. Moves both UE equally.   SKIN: No obvious lesions, wounds, erythema, or cyanosis noted on face or hands.  Results for orders placed or performed in visit on 09/13/18  POCT Urinalysis Dipstick  Result Value Ref Range   Color, UA yellow    Clarity, UA cloudy    Glucose, UA Negative Negative   Bilirubin, UA neg    Ketones, UA neg    Spec Grav, UA 1.020 1.010 - 1.025   Blood, UA large    pH, UA 7.0 5.0 - 8.0   Protein, UA Positive (A) Negative   Urobilinogen, UA 0.2 0.2 or 1.0 E.U./dL   Nitrite, UA negative    Leukocytes, UA Large (3+) (A) Negative   Appearance     Odor       Assessment and Plan:   Alisha Hughes was seen today for dysuria.  Diagnoses and all orders for this visit:  Cystitis Suspect acute cystitis. She does have strong history of kidney stones, and she knows that we cannot rule that out at this time. Start keflex. Await culture. I have also given her prescription for UTI prophylaxis with macrobid to see if this can help decrease the frequency of her UTIs. Worsening precautions advised.  Other orders -     nitrofurantoin, macrocrystal-monohydrate, (MACROBID) 100 MG capsule; Take 1 capsule (100 mg total) by mouth as needed (after sexual intercourse). -     cephALEXin (KEFLEX) 500 MG capsule; Take 1 capsule (500 mg total) by mouth 3 (three) times daily.    . Reviewed expectations re: course of current medical issues. . Discussed self-management of symptoms. . Outlined signs and symptoms indicating need for more acute intervention. . Patient verbalized understanding and all questions were answered. Marland Kitchen Health Maintenance issues including appropriate healthy diet, exercise, and smoking avoidance were discussed with patient. . See orders for this visit as documented in the electronic medical record.  I discussed the assessment and treatment plan with the patient. The patient was provided an opportunity to ask questions and all were answered. The patient agreed with the plan and demonstrated an  understanding of the instructions.   The patient was advised to call back or seek an in-person evaluation if the symptoms worsen or if the condition fails to improve as anticipated.   CMA or LPN served as scribe during this visit. History, Physical, and Plan performed by medical provider. The above documentation has been reviewed and is accurate and complete.  [place], [place] 09/13/2018

## 2018-09-14 LAB — URINE CULTURE
MICRO NUMBER:: 523722
SPECIMEN QUALITY:: ADEQUATE

## 2018-10-06 ENCOUNTER — Ambulatory Visit: Payer: Self-pay | Admitting: Physician Assistant

## 2018-10-06 NOTE — Telephone Encounter (Signed)
Please schedule patient for virtual visit.  Thank you!

## 2018-10-06 NOTE — Telephone Encounter (Signed)
Pt. Reports started feeling bad yesterday - headache, sore throat, body aches, temp. 99.9, cough. Concerned about COVID 19. Spoke with [place] and will forward triage over for review.  Answer Assessment - Initial Assessment Questions 1. COVID-19 DIAGNOSIS: "Who made your Coronavirus (COVID-19) diagnosis?" "Was it confirmed by a positive lab test?" If not diagnosed by a HCP, ask "Are there lots of cases (community spread) where you live?" (See public health department website, if unsure)     No test 2. ONSET: "When did the COVID-19 symptoms start?"      Yesterday 3. WORST SYMPTOM: "What is your worst symptom?" (e.g., cough, fever, shortness of breath, muscle aches)     Sore throat, headache 4. COUGH: "Do you have a cough?" If so, ask: "How bad is the cough?"       Cough 5. FEVER: "Do you have a fever?" If so, ask: "What is your temperature, how was it measured, and when did it start?"     99.9 6. RESPIRATORY STATUS: "Describe your breathing?" (e.g., shortness of breath, wheezing, unable to speak)      No 7. BETTER-SAME-WORSE: "Are you getting better, staying the same or getting worse compared to yesterday?"  If getting worse, ask, "In what way?"     Worse 8. HIGH RISK DISEASE: "Do you have any chronic medical problems?" (e.g., asthma, heart or lung disease, weak immune system, etc.)     HTN 9. PREGNANCY: "Is there any chance you are pregnant?" "When was your last menstrual period?"     No 10. OTHER SYMPTOMS: "Do you have any other symptoms?"  (e.g., chills, fatigue, headache, loss of smell or taste, muscle pain, sore throat)       Body aches  Protocols used: CORONAVIRUS (COVID-19) DIAGNOSED OR SUSPECTED-A-AH

## 2018-10-07 ENCOUNTER — Ambulatory Visit: Payer: 59 | Admitting: Family Medicine

## 2018-10-11 ENCOUNTER — Ambulatory Visit (INDEPENDENT_AMBULATORY_CARE_PROVIDER_SITE_OTHER): Payer: 59 | Admitting: Physician Assistant

## 2018-10-11 ENCOUNTER — Encounter: Payer: Self-pay | Admitting: Physician Assistant

## 2018-10-11 DIAGNOSIS — J029 Acute pharyngitis, unspecified: Secondary | ICD-10-CM | POA: Diagnosis not present

## 2018-10-11 MED ORDER — AZITHROMYCIN 250 MG PO TABS: ORAL_TABLET | ORAL | 0 refills | Status: DC

## 2018-10-11 NOTE — Progress Notes (Signed)
Virtual Visit via Video   I connected with Alisha Hughes on 10/11/18 at  4:00 PM EDT by a video enabled telemedicine application and verified that I am speaking with the correct person using two identifiers. Location patient: Home Location provider: [facility], Office Persons participating in the virtual visit: [place], [place] PA-C  I discussed the limitations of evaluation and management by telemedicine and the availability of in person appointments. The patient expressed understanding and agreed to proceed.  I acted as a Education administrator for [employer], PA-C [employer], LPN  Subjective:   HPI:   Sore throat Pt c/o sore throat, body aches since Tuesday, and Wed woke up with fever 101.5, was [place] the beach came home early. She had COVID testing done on Thursday which came back Negative today. Pt said she still has sore throat, body aches and all she has done is sleep for 5 days, no fever in 3 days. Pt is concerned due to starting new job tomorrow.  Has had worsening cough over the past two days. Denies severe SOB, stridor, one-sided worsening swelling, dysuria.  ROS: See pertinent positives and negatives per HPI.  Patient Active Problem List   Diagnosis Date Noted  . HSV (herpes simplex virus) infection 02/10/2018  . Hyperlipidemia 02/03/2018  . Abnormal ECG 02/03/2018  . 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 Pain in right knee 09/29/2017  . Overweight 08/12/2017  . Irritable bowel syndrome with diarrhea 07/15/2016  . Vitamin D deficiency 07/15/2016  . Anxiety and depression 02/11/2016  . Essential hypertension 02/08/2013  . Chronic migraine without aura 12/07/2012    Social History   Tobacco Use  . Smoking status: Never Smoker  . Smokeless tobacco: Never Used  Substance Use Topics  . Alcohol use: Yes    Alcohol/week: 0.0 standard drinks    Comment: rarely    Current Outpatient Medications:  .  amLODipine (NORVASC) 10 MG tablet, Take 1  tablet (10 mg total) by mouth daily., Disp: 30 tablet, Rfl: 5 .  buPROPion (WELLBUTRIN XL) 150 MG 24 hr tablet, Take 150 mg ER PO qam., Disp: 30 tablet, Rfl: 1 .  lisinopril-hydrochlorothiazide (ZESTORETIC) 20-12.5 MG tablet, Take 1 tablet by mouth daily., Disp: 30 tablet, Rfl: 1 .  SUMAtriptan (IMITREX) 100 MG tablet, sumatriptan 100 mg tablet PRN, Disp: , Rfl:  .  valACYclovir (VALTREX) 500 MG tablet, Take 1 tablet (500 mg total) by mouth daily. Can increase to twice a day for 5 days in the event of a recurrence, Disp: 30 tablet, Rfl: 12 .  azithromycin (ZITHROMAX) 250 MG tablet, Take two tablets on day 1, then one tablet daily thereafter, Disp: 6 tablet, Rfl: 0 .  tamsulosin (FLOMAX) 0.4 MG CAPS capsule, Take 1 capsule (0.4 mg total) by mouth daily. (Patient not taking: Reported on 10/11/2018), Disp: 30 capsule, Rfl: 0  Allergies  Allergen Reactions  . Latex Rash  . Reglan [Metoclopramide] Nausea Only  . Sertraline Rash    Objective:   VITALS: Per patient if applicable, see vitals. GENERAL: Alert, appears well and in no acute distress. HEENT: Atraumatic, conjunctiva clear, no obvious abnormalities on inspection of external nose and ears. NECK: Normal movements of the head and neck. CARDIOPULMONARY: No increased WOB. Speaking in clear sentences. I:E ratio WNL.  MS: Moves all visible extremities without noticeable abnormality. PSYCH: Pleasant and cooperative, well-groomed. Speech normal rate and rhythm. Affect is appropriate. Insight and judgement are appropriate. Attention is focused, linear, and appropriate.  NEURO:  CN grossly intact. Oriented as arrived to appointment on time with no prompting. Moves both UE equally.  SKIN: No obvious lesions, wounds, erythema, or cyanosis noted on face or hands.  Assessment and Plan:   Marisal was seen today for sore throat.  Diagnoses and all orders for this visit:  Sore throat  Other orders -     azithromycin (ZITHROMAX) 250 MG tablet; Take  two tablets on day 1, then one tablet daily thereafter   Discussed options to empirically treat vs test (strep/mono). She would like to empirically treat. Will trial azithromycin given cough and throat pain. I did discuss that I cannot rule out PNA, strep, mono. She verbalized understanding to plan. Red flags reviewed and discussed with patient.  . Reviewed expectations re: course of current medical issues. . Discussed self-management of symptoms. . Outlined signs and symptoms indicating need for more acute intervention. . Patient verbalized understanding and all questions were answered. Marland Kitchen Health Maintenance issues including appropriate healthy diet, exercise, and smoking avoidance were discussed with patient. . See orders for this visit as documented in the electronic medical record.  I discussed the assessment and treatment plan with the patient. The patient was provided an opportunity to ask questions and all were answered. The patient agreed with the plan and demonstrated an understanding of the instructions.   The patient was advised to call back or seek an in-person evaluation if the symptoms worsen or if the condition fails to improve as anticipated.   CMA or LPN served as scribe during this visit. History, Physical, and Plan performed by medical provider. The above documentation has been reviewed and is accurate and complete.   [place], [place] 10/11/2018

## 2018-11-01 ENCOUNTER — Other Ambulatory Visit: Payer: Self-pay | Admitting: *Deleted

## 2018-11-01 MED ORDER — BUPROPION HCL ER (XL) 150 MG PO TB24: ORAL_TABLET | ORAL | 0 refills | Status: DC

## 2018-11-02 ENCOUNTER — Ambulatory Visit: Payer: Self-pay | Admitting: Physician Assistant

## 2018-11-02 NOTE — Telephone Encounter (Addendum)
Pt. Reports she started having chest pain this morning at work and had her Mom pick her up. Pain is 5/10 and hurts in center of chest and radiates to right arm and shoulder and back. Feels dizzy and sweaty. BP 154/101. Will have Mom take her to [facility] ED. States she is closer to this facility and does not want to go to a [facility] facility.  Reason for Disposition . Pain also in shoulder(s) or arm(s) or jaw  Answer Assessment - Initial Assessment Questions 1. LOCATION: "Where does it hurt?"       Middle of chest 2. RADIATION: "Does the pain go anywhere else?" (e.g., into neck, jaw, arms, back)     Back and right arm 3. ONSET: "When did the chest pain begin?" (Minutes, hours or days)      This morning 4. PATTERN "Does the pain come and go, or has it been constant since it started?"  "Does it get worse with exertion?"      Constant now 5. DURATION: "How long does it last" (e.g., seconds, minutes, hours)     Constant 6. SEVERITY: "How bad is the pain?"  (e.g., Scale 1-10; mild, moderate, or severe)    - MILD (1-3): doesn't interfere with normal activities     - MODERATE (4-7): interferes with normal activities or awakens from sleep    - SEVERE (8-10): excruciating pain, unable to do any normal activities       5 7. CARDIAC RISK FACTORS: "Do you have any history of heart problems or risk factors for heart disease?" (e.g., angina, prior heart attack; diabetes, high blood pressure, high cholesterol, smoker, or strong family history of heart disease)     Saw a cardiologist last year 83. PULMONARY RISK FACTORS: "Do you have any history of lung disease?"  (e.g., blood clots in lung, asthma, emphysema, birth control pills)     No 9. CAUSE: "What do you think is causing the chest pain?"     Unsure 10. OTHER SYMPTOMS: "Do you have any other symptoms?" (e.g., dizziness, nausea, vomiting, sweating, fever, difficulty breathing, cough)       Nausea, dizzy, headache, sweaty 11. PREGNANCY: "Is there  any chance you are pregnant?" "When was your last menstrual period?"       No  Protocols used: CHEST PAIN-A-AH

## 2018-11-02 NOTE — Telephone Encounter (Signed)
FYI

## 2018-11-02 NOTE — Telephone Encounter (Signed)
See note

## 2018-11-03 ENCOUNTER — Other Ambulatory Visit: Payer: Self-pay | Admitting: Physician Assistant

## 2019-01-21 DIAGNOSIS — Z20828 Contact with and (suspected) exposure to other viral communicable diseases: Secondary | ICD-10-CM | POA: Diagnosis not present

## 2019-01-25 ENCOUNTER — Encounter: Payer: Self-pay | Admitting: Radiology

## 2019-02-03 ENCOUNTER — Ambulatory Visit
Admission: RE | Admit: 2019-02-03 | Discharge: 2019-02-03 | Disposition: A | Discharge status: Home / Self Care | Payer: Self-pay | Source: Ambulatory Visit | Attending: Family Medicine | Admitting: Family Medicine

## 2019-02-03 ENCOUNTER — Ambulatory Visit: Payer: Self-pay | Admitting: *Deleted

## 2019-02-03 ENCOUNTER — Telehealth: Payer: Self-pay

## 2019-02-03 ENCOUNTER — Encounter: Payer: Self-pay | Admitting: Family Medicine

## 2019-02-03 ENCOUNTER — Encounter: Payer: Self-pay | Admitting: Physician Assistant

## 2019-02-03 ENCOUNTER — Ambulatory Visit (INDEPENDENT_AMBULATORY_CARE_PROVIDER_SITE_OTHER): Payer: BC Managed Care – PPO | Admitting: Family Medicine

## 2019-02-03 ENCOUNTER — Telehealth: Payer: Self-pay | Admitting: Physician Assistant

## 2019-02-03 ENCOUNTER — Telehealth: Payer: Self-pay | Admitting: *Deleted

## 2019-02-03 ENCOUNTER — Other Ambulatory Visit: Payer: Self-pay

## 2019-02-03 VITALS — Ht 63.0 in | Wt 160.0 lb

## 2019-02-03 DIAGNOSIS — R0602 Shortness of breath: Secondary | ICD-10-CM

## 2019-02-03 DIAGNOSIS — L700 Acne vulgaris: Secondary | ICD-10-CM | POA: Diagnosis not present

## 2019-02-03 DIAGNOSIS — R059 Cough, unspecified: Secondary | ICD-10-CM

## 2019-02-03 DIAGNOSIS — R05 Cough: Secondary | ICD-10-CM

## 2019-02-03 DIAGNOSIS — R091 Pleurisy: Secondary | ICD-10-CM | POA: Diagnosis not present

## 2019-02-03 MED ORDER — AZITHROMYCIN 250 MG PO TABS: ORAL_TABLET | ORAL | 0 refills | Status: DC

## 2019-02-03 MED ORDER — AMOXICILLIN 500 MG PO CAPS: 1000.0000 mg | ORAL_CAPSULE | Freq: Three times a day (TID) | ORAL | 0 refills | Status: DC

## 2019-02-03 MED ORDER — BENZONATATE 200 MG PO CAPS: 200.0000 mg | ORAL_CAPSULE | Freq: Two times a day (BID) | ORAL | 0 refills | Status: DC | PRN

## 2019-02-03 NOTE — Progress Notes (Signed)
Patient: Alisha Hughes MRN: [id number] DOB: 01/21/1981 PCP: Inda Coke, PA     I connected with Alisha Hughes on 02/03/19 at 1:12pm by a video enabled telemedicine application and verified that I am speaking with the correct person using two identifiers.  Location patient: Home Location provider: [facility], Office Persons participating in this virtual visit: Alisha Hughes and Dr. Rogers Blocker   I discussed the limitations of evaluation and management by telemedicine and the availability of in person appointments. The patient expressed understanding and agreed to proceed.   Subjective:  Chief Complaint  Patient presents with  . Shortness of Breath  . Chest Pain    HPI: The patient is a 38 y.o. female who presents today for cough, SOB, chest tightness, URI symptoms x 2 wks. She has tested negative for covid. 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 When she started to feel bad she was tested for covid and it was negative. She had a sore throat, headache, diarrhea and congestion. It was a rapid test. She has had cold like symptoms for 2 weeks, no headaches. Her congestion has stayed the same and she keeps getting a sore throat every few days. She has seen the nurse at her shop every Monday and Wednesday. She started to get a cough the past 3 days. The nurse also stated her right lungs sounded like something is in them. She also states her cough is becoming productive. Pain with deep inspiration and cough in her chest. No sinus pain or pressure, no ear pain. She has tried sudafed, mucinex and flonase over the counter. She has been afebrile the entire time.   Review of Systems  Constitutional: Positive for fatigue. Negative for chills and fever.  HENT: Positive for congestion, postnasal drip, rhinorrhea and sore throat. Negative for sinus pressure and sinus pain.   Eyes: Negative for photophobia and pain.  Respiratory: Positive for cough, chest tightness and shortness of  breath.   Cardiovascular: Negative for chest pain, palpitations and leg swelling.  Gastrointestinal: Negative for abdominal pain, diarrhea, nausea and vomiting.  Musculoskeletal: Negative for back pain, myalgias and neck pain.  Neurological: Negative for dizziness and headaches.    Allergies Patient is allergic to latex; reglan [metoclopramide]; and sertraline.  Past Medical History Patient  has a past medical history of Anxiety, Anxiety and depression, Hyperlipidemia, and Hypertension.  Surgical History Patient  has a past surgical history that includes Colposcopy vulva w/ biopsy; Cesarean section; Colonoscopy; Wisdom tooth extraction; Cesarean section (N/A, 12/19/2012); Upper gi endoscopy; Knee arthroscopy; Tubal ligation; laparoscopy (N/A, 07/17/2015); Endometrial ablation (N/A, 07/17/2015); Laparoscopic lysis of adhesions (07/17/2015); and Cholecystectomy (2010).  Family History [name]'s family history includes Anuerysm in her mother; Depression in her brother; Diabetes in her paternal grandmother; Heart disease in her father, maternal grandfather, and paternal grandmother; Hyperlipidemia in her father; Hypertension in her father and mother; Migraines in her mother; Multiple sclerosis in her maternal grandmother; Parkinson's disease in her paternal grandfather; Prostate cancer in her father; Sudden death in her maternal grandfather and maternal grandmother.  Social History Patient  reports that she has never smoked. She has never used smokeless tobacco. She reports current alcohol use. She reports that she does not use drugs.    Objective: Vitals:   02/03/19 1259  Weight: 160 lb (72.6 kg)  Height: 5\' 3"  (1.6 m)    Body mass index is 28.34 kg/m.  Physical Exam Vitals signs reviewed.  Constitutional:      General:  She is not in acute distress.    Appearance: She is well-developed. She is not ill-appearing.  HENT:     Head: Normocephalic and atraumatic.     Comments: No sinus pain or  pressure when she presses on her sinuses Pulmonary:     Effort: Pulmonary effort is normal.     Comments: Talking fast and in full sentences, comfortably. No evidence of any discomfort, shortness of breath  Neurological:     General: No focal deficit present.     Mental Status: She is alert and oriented to person, place, and time.  Psychiatric:        Mood and Affect: Mood normal.        Behavior: Behavior normal.        Assessment/plan: 1. Cough Needs CXR and have future ordered this. covid test negative and past 10 days from symptoms onset. Will send in abx to cover for CAP and will have her hold until I get her CXR back. Recommended supportive therapy with rest, fluids, cool mist humidifier, honey, robitussin DM and will send in tessalon pearls for cough. Recommended flonase bid for her post nasal drip that is likely cause of her sore throat. If not better, will need to be seen in office. Will /fu on chest xray. precautions given.  - DG Chest 2 View; Future  2. Pleurisy Discussed treatment is NSAIDs. Make sure doing deep breathing as well. See above for plan.    Return if symptoms worsen or fail to improve.   Orma Flaming, MD [place]  02/03/2019

## 2019-02-03 NOTE — Telephone Encounter (Signed)
Please call pt and schedule virtual with Dr. Rogers Blocker today.

## 2019-02-03 NOTE — Telephone Encounter (Signed)
  Reason for Disposition . [1] MILD difficulty breathing (e.g., minimal/no SOB at rest, SOB with walking, pulse <100) AND [2] NEW-onset or WORSE than normal  Answer Assessment - Initial Assessment Questions 1. RESPIRATORY STATUS: "Describe your breathing?" (e.g., wheezing, shortness of breath, unable to speak, severe coughing)      Uncomfortable with deep breaths and exertion 2. ONSET: "When did this breathing problem begin?"      2 weeks ago 3. PATTERN "Does the difficult breathing come and go, or has it been constant since it started?"      Developed within 2 days- began with sore throat and drainage- now coughs and has discomfort in chest 4. SEVERITY: "How bad is your breathing?" (e.g., mild, moderate, severe)    - MILD: No SOB at rest, mild SOB with walking, speaks normally in sentences, can lay down, no retractions, pulse < 100.    - MODERATE: SOB at rest, SOB with minimal exertion and prefers to sit, cannot lie down flat, speaks in phrases, mild retractions, audible wheezing, pulse 100-120.    - SEVERE: Very SOB at rest, speaks in single words, struggling to breathe, sitting hunched forward, retractions, pulse > 120      mild 5. RECURRENT SYMPTOM: "Have you had difficulty breathing before?" If so, ask: "When was the last time?" and "What happened that time?"      Yes- bronchitis, hx pneumonia  6. CARDIAC HISTORY: "Do you have any history of heart disease?" (e.g., heart attack, angina, bypass surgery, angioplasty)      No- BP medication only 7. LUNG HISTORY: "Do you have any history of lung disease?"  (e.g., pulmonary embolus, asthma, emphysema)     no 8. CAUSE: "What do you think is causing the breathing problem?"      Patient was seen by office nurse and she heard fluid/movement in the R lung 9. OTHER SYMPTOMS: "Do you have any other symptoms? (e.g., dizziness, runny nose, cough, chest pain, fever)     Nasal congestion, cough,chest tightness with exertion on the R 10. PREGNANCY: "Is  there any chance you are pregnant?" "When was your last menstrual period?"       No- LMP- beginning of October 11. TRAVEL: "Have you traveled out of the country in the last month?" (e.g., travel history, exposures)       no  Protocols used: BREATHING DIFFICULTY-A-AH

## 2019-02-03 NOTE — Telephone Encounter (Signed)
See note

## 2019-02-03 NOTE — Telephone Encounter (Signed)
Copied from [place] [id number]. Topic: General - Other >> Feb 03, 2019  3:45 PM Keene Breath wrote: Reason for CRM: Called to inform the doctor that the order that was put in electronically has to be signed electronically.  Please advise and call to discuss if needed.  [phone]

## 2019-02-03 NOTE — Telephone Encounter (Signed)
Left detailed message on personal voicemail. Aldona Bar saw you were on the schedule tomorrow and with your symptoms she wants you to be tested for COVID and go to Urgent Care today to be evaluated. Any questions please call office.

## 2019-02-03 NOTE — Telephone Encounter (Signed)
Patient returned call- but hung up before she could be connected. Attempted to call patient back- got voice mail- left message to call back.

## 2019-02-03 NOTE — Telephone Encounter (Signed)
Please triage, per pt comment for appt scheduled through mychart, chest tightness and difficulty taking deep breaths. Thank you!  Patient Comments: two weeks of cold symptoms persistent drainage with meds. Started having chest tightness and difficulty taking deep breaths. Feels tight and pinching.  Been tested for covid negative. Met with work nurse on Wednesday and she said if worse to contact you possible bronchitis or walking pneumonia and need to be seen.

## 2019-02-03 NOTE — Telephone Encounter (Signed)
Note   Please triage, per pt comment for appt scheduled through mychart, chest tightness and difficulty taking deep breaths. Thank you!  Patient Comments: two weeks of cold symptoms persistent drainage with meds. Started having chest tightness and difficulty taking deep breaths. Feels tight and pinching. Been tested for covid negative. Met with work nurse on Wednesday and she said if worse to contact you possible bronchitis or walking pneumonia and need to be seen.

## 2019-02-03 NOTE — Telephone Encounter (Signed)
See other message

## 2019-02-04 ENCOUNTER — Ambulatory Visit: Payer: 59 | Admitting: Physician Assistant

## 2019-02-04 NOTE — Telephone Encounter (Signed)
This was already taken care of.  Order was printed out and stamped with signature stamp and faxed to [facility].

## 2019-02-14 DIAGNOSIS — H6690 Otitis media, unspecified, unspecified ear: Secondary | ICD-10-CM | POA: Diagnosis not present

## 2019-02-14 DIAGNOSIS — J9801 Acute bronchospasm: Secondary | ICD-10-CM | POA: Diagnosis not present

## 2019-02-14 DIAGNOSIS — H6500 Acute serous otitis media, unspecified ear: Secondary | ICD-10-CM | POA: Diagnosis not present

## 2019-02-14 DIAGNOSIS — J019 Acute sinusitis, unspecified: Secondary | ICD-10-CM | POA: Diagnosis not present

## 2019-02-16 DIAGNOSIS — R062 Wheezing: Secondary | ICD-10-CM | POA: Diagnosis not present

## 2019-02-16 DIAGNOSIS — Z20828 Contact with and (suspected) exposure to other viral communicable diseases: Secondary | ICD-10-CM | POA: Diagnosis not present

## 2019-02-16 DIAGNOSIS — R05 Cough: Secondary | ICD-10-CM | POA: Diagnosis not present

## 2019-02-16 DIAGNOSIS — R0602 Shortness of breath: Secondary | ICD-10-CM | POA: Diagnosis not present

## 2019-02-22 ENCOUNTER — Other Ambulatory Visit: Payer: Self-pay

## 2019-02-22 ENCOUNTER — Ambulatory Visit (INDEPENDENT_AMBULATORY_CARE_PROVIDER_SITE_OTHER): Payer: BC Managed Care – PPO | Admitting: Family Medicine

## 2019-02-22 ENCOUNTER — Encounter: Payer: Self-pay | Admitting: Family Medicine

## 2019-02-22 VITALS — BP 156/97 | HR 74 | Temp 98.0°F | Ht 63.0 in | Wt 175.2 lb

## 2019-02-22 DIAGNOSIS — R0602 Shortness of breath: Secondary | ICD-10-CM

## 2019-02-22 DIAGNOSIS — R079 Chest pain, unspecified: Secondary | ICD-10-CM | POA: Diagnosis not present

## 2019-02-22 DIAGNOSIS — I10 Essential (primary) hypertension: Secondary | ICD-10-CM

## 2019-02-22 LAB — CBC
HCT: 45.2 % (ref 36.0–46.0)
Hemoglobin: 15 g/dL (ref 12.0–15.0)
MCHC: 33.2 g/dL (ref 30.0–36.0)
MCV: 86.2 fl (ref 78.0–100.0)
Platelets: 369 10*3/uL (ref 150.0–400.0)
RBC: 5.24 Mil/uL — ABNORMAL HIGH (ref 3.87–5.11)
RDW: 13.6 % (ref 11.5–15.5)
WBC: 11.5 10*3/uL — ABNORMAL HIGH (ref 4.0–10.5)

## 2019-02-22 LAB — TSH: TSH: 2.88 u[IU]/mL (ref 0.35–4.50)

## 2019-02-22 LAB — COMPREHENSIVE METABOLIC PANEL
ALT: 19 U/L (ref 0–35)
AST: 11 U/L (ref 0–37)
Albumin: 3.9 g/dL (ref 3.5–5.2)
Alkaline Phosphatase: 62 U/L (ref 39–117)
BUN: 17 mg/dL (ref 6–23)
CO2: 31 mEq/L (ref 19–32)
Calcium: 9 mg/dL (ref 8.4–10.5)
Chloride: 99 mEq/L (ref 96–112)
Creatinine, Ser: 0.78 mg/dL (ref 0.40–1.20)
GFR: 83.15 mL/min (ref >60.00)
Glucose, Bld: 92 mg/dL (ref 70–99)
Potassium: 4.1 mEq/L (ref 3.5–5.1)
Sodium: 136 mEq/L (ref 135–145)
Total Bilirubin: 0.4 mg/dL (ref 0.2–1.2)
Total Protein: 6.6 g/dL (ref 6.0–8.3)

## 2019-02-22 LAB — D-DIMER, QUANTITATIVE: D-Dimer, Quant: <0.19 mcg/mL FEU (ref <0.50)

## 2019-02-22 MED ORDER — BUDESONIDE-FORMOTEROL FUMARATE 80-4.5 MCG/ACT IN AERO: 2.0000 | INHALATION_SPRAY | Freq: Every day | RESPIRATORY_TRACT | 3 refills | Status: DC

## 2019-02-22 NOTE — Progress Notes (Signed)
Please inform patient of the following:  Patient does not have a blood clot. Her white blood cell counts are elevated - this is probably due to the prednisone. All of her other labs are NORMAL with no clear cause of her symptoms. Please send in symbicort 80/4.5 take 2 puffs once daily - this should help with any underlying inflammed airways. Would like for her to follow up with me or her PCP if symptoms are not improving over the next few days.   Algis Greenhouse. Jerline Pain, MD 02/22/2019 3:34 PM

## 2019-02-22 NOTE — Progress Notes (Signed)
   Chief Complaint:  Alisha Hughes is a 38 y.o. female who presents today with a chief complaint of shortness of breath.   Assessment/Plan:  Shortness of breath Exam today within normal limits.  She has normal O2 saturation on room air.  Normal work of breathing and lung exam is clear.  Additionally has had x-ray done within the last week was clear.  Do not think that she has a pulmonary etiology.  Given her pleurisy does raise concern for possible PE-we will obtain D-dimer to rule this out today.  EKG today is stable from previous-doubt cardiac etiology given reassuring EKG and prolonged course of symptoms.  We will additionally check CBC, C met, and TSH.  It is possible this could be due to reactive airways/airway inflammation.  If above work-up is negative, would consider trial of Symbicort or other ICS.  Discussed strict reasons to return to care and seek emergent care.  Essential hypertension Slightly above goal today.  Will continue amlodipine '10mg'$ , lisinopril-HCTZ 20-12.5 daily. Continue home monitoring with goal 140/90 or lower.     Subjective:  HPI:  Shortness of Breath Started 3-4 weeks ago.  At that time was having URI symptoms including rhinorrhea and sore throat. Over the last few weeks, she has had 3 covid tests which were negative. Had an xray which was negative as well about 2 weeks ago. Went to urgent care and was started prednisone and amoxicillin. Symptoms worsened. She was then started on azithromycin and another round of prednisone a few days ago. Today, she feels more short of breath this morning.  Has pain with deep inspiration in the center of her chest.   ROS: Per HPI  PMH: She reports that she has never smoked. She has never used smokeless tobacco. She reports current alcohol use. She reports that she does not use drugs.      Objective:  Physical Exam: BP (!) 156/97   Pulse 74   Temp 98 F (36.7 C)   Ht '5\' 3"'$  (1.6 m)   Wt 175 lb 4 oz (79.5 kg)   SpO2 97%    BMI 31.04 kg/m   Gen: NAD, resting comfortably CV: Regular rate and rhythm with no murmurs appreciated Pulm: Normal work of breathing, clear to auscultation bilaterally with no crackles, wheezes, or rhonchi GI: Normal bowel sounds present. Soft, Nontender, Nondistended. MSK: No edema, cyanosis, or clubbing noted Skin: Warm, dry Neuro: Grossly normal, moves all extremities Psych: Normal affect and thought content  EKG: NSR.  Slight ST elevation noted in V1 and V2 consistent with prior EKGs.  Time Spent: I spent >25 minutes face-to-face with the patient, with more than half spent on counseling for management plan for her chest pain and shortness of breath.      Algis Greenhouse. Jerline Pain, MD 02/22/2019 11:36 AM

## 2019-02-22 NOTE — Patient Instructions (Signed)
It was very nice to see you today!  Your EKG today is similar to previous EKGs.  We will check blood work today to make sure there is nothing else going on.  Please keep an eye on your blood pressure at home and let us know if persistently 140/90 or higher.  Take care, Dr Jerline Pain  Please try these tips to maintain a healthy lifestyle:   Eat at least 3 REAL meals and 1-2 snacks per day.  Aim for no more than 5 hours between eating.  If you eat breakfast, please do so within one hour of getting up.    Obtain twice as many fruits/vegetables as protein or carbohydrate foods for both lunch and dinner. (Half of each meal should be fruits/vegetables, one quarter protein, and one quarter starchy carbs)   Cut down on sweet beverages. This includes juice, soda, and sweet tea.    Exercise at least 150 minutes every week.

## 2019-02-23 ENCOUNTER — Encounter: Payer: Self-pay | Admitting: Family Medicine

## 2019-02-23 NOTE — Telephone Encounter (Signed)
Spoke to pt told her Aldona Bar and Dr. Jerline Pain are both out of the office so I had Dr. Jonni Sanger review your chart in detail and she said: Because she has already seen several different providers for this ongoing problem, tell her to complete todays dose and then I defer to Jerline Pain or Aldona Bar for their input upon continuation of prednisone.  I haven't seen her and the final decision can be defered until tomorrow. Pt verbalized understanding. Told her I will forward message to Dr. Jerline Pain and talk to him first thing in the morning and get back to you. Pt verbalized understanding and said okay. Pt denies SOB.

## 2019-02-23 NOTE — Telephone Encounter (Signed)
Please see message and advise due to Aldona Bar and Jerline Pain out of the office.

## 2019-02-24 NOTE — Telephone Encounter (Signed)
Dr. Jerline Pain, please see message and advise.

## 2019-02-24 NOTE — Telephone Encounter (Signed)
Left detailed message on personal voicemail, Dr. Jerline Pain said It is ok for her to stop her prednisone if it is making her feel bad - do not need to taper. Can stop completely. If you have any questions please call office.

## 2019-03-03 ENCOUNTER — Encounter: Payer: Self-pay | Admitting: Physician Assistant

## 2019-03-04 ENCOUNTER — Other Ambulatory Visit (INDEPENDENT_AMBULATORY_CARE_PROVIDER_SITE_OTHER): Payer: BC Managed Care – PPO

## 2019-03-04 ENCOUNTER — Encounter: Payer: Self-pay | Admitting: Physician Assistant

## 2019-03-04 ENCOUNTER — Other Ambulatory Visit: Payer: Self-pay

## 2019-03-04 ENCOUNTER — Ambulatory Visit (INDEPENDENT_AMBULATORY_CARE_PROVIDER_SITE_OTHER): Payer: BC Managed Care – PPO | Admitting: Physician Assistant

## 2019-03-04 VITALS — BP 158/114 | Ht 63.0 in | Wt 175.0 lb

## 2019-03-04 DIAGNOSIS — R635 Abnormal weight gain: Secondary | ICD-10-CM | POA: Diagnosis not present

## 2019-03-04 LAB — HEMOGLOBIN A1C: Hgb A1c MFr Bld: 5.5 % (ref 4.6–6.5)

## 2019-03-04 LAB — POCT URINE PREGNANCY: Preg Test, Ur: NEGATIVE

## 2019-03-04 MED ORDER — LISINOPRIL-HYDROCHLOROTHIAZIDE 20-25 MG PO TABS: 1.0000 | ORAL_TABLET | Freq: Every day | ORAL | 0 refills | Status: DC

## 2019-03-04 NOTE — Progress Notes (Signed)
Virtual Visit via Video   I connected with Alisha Hughes on 03/04/19 at  1:00 PM EST by a video enabled telemedicine application and verified that I am speaking with the correct person using two identifiers. Location patient: Home Location provider: [facility], Office Persons participating in the virtual visit: Khris, Reil PA-C, Anselmo Pickler, LPN   I discussed the limitations of evaluation and management by telemedicine and the availability of in person appointments. The patient expressed understanding and agreed to proceed.  Subjective:   HPI:   Weight Pt c/o weight gain for the past month, gained 15 lbs. Pt said she was on prednisone for several days, and the highest dose was 60 mg.  In total she has gained somewhere between 10 to 18 pounds over the past month.  She has been trying to increase her exercise, however with the extra weight she is having more knee pain and is limited.  Patient's last menstrual period was 02/14/2019.  She has had a tubal ligation and ablation.  Breakfast: yogurt with granola OR avocado toast; coffee with creamer Lunch: salad with chicken/fish; diet coke Dinner: lean protein, non-starchy veggies, lemon orzo Dessert: sugar-free jello  Rarely drinks, has occasional regular Coke.  Wt Readings from Last 10 Encounters:  03/04/19 175 lb (79.4 kg)  02/22/19 175 lb 4 oz (79.5 kg)  02/03/19 160 lb (72.6 kg)  04/12/18 160 lb 8 oz (72.8 kg)  02/11/18 156 lb 6.4 oz (70.9 kg)  02/10/18 155 lb (70.3 kg)  02/04/18 155 lb (70.3 kg)  02/02/18 155 lb (70.3 kg)  11/25/17 156 lb 3.2 oz (70.9 kg)  08/12/17 155 lb (70.3 kg)     ROS: See pertinent positives and negatives per HPI.  Patient Active Problem List   Diagnosis Date Noted  . HSV (herpes simplex virus) infection 02/10/2018  . Hyperlipidemia 02/03/2018  . Abnormal ECG 02/03/2018  . 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 Pain in right knee 09/29/2017  . Overweight  08/12/2017  . Irritable bowel syndrome with diarrhea 07/15/2016  . Vitamin D deficiency 07/15/2016  . Anxiety and depression 02/11/2016  . Essential hypertension 02/08/2013  . Chronic migraine without aura 12/07/2012    Social History   Tobacco Use  . Smoking status: Never Smoker  . Smokeless tobacco: Never Used  Substance Use Topics  . Alcohol use: Yes    Alcohol/week: 0.0 standard drinks    Comment: rarely    Current Outpatient Medications:  .  amLODipine (NORVASC) 10 MG tablet, Take 1 tablet (10 mg total) by mouth daily., Disp: 30 tablet, Rfl: 5 .  buPROPion (WELLBUTRIN XL) 150 MG 24 hr tablet, Take 150 mg ER PO qam., Disp: 30 tablet, Rfl: 0 .  SUMAtriptan (IMITREX) 100 MG tablet, sumatriptan 100 mg tablet PRN, Disp: , Rfl:  .  valACYclovir (VALTREX) 500 MG tablet, Take 1 tablet (500 mg total) by mouth daily. Can increase to twice a day for 5 days in the event of a recurrence, Disp: 30 tablet, Rfl: 12 .  lisinopril-hydrochlorothiazide (ZESTORETIC) 20-25 MG tablet, Take 1 tablet by mouth daily., Disp: 90 tablet, Rfl: 0  Allergies  Allergen Reactions  . Latex Rash  . Reglan [Metoclopramide] Nausea Only  . Sertraline Rash    Objective:   VITALS: Per patient if applicable, see vitals. GENERAL: Alert, appears well and in no acute distress. HEENT: Atraumatic, conjunctiva clear, no obvious abnormalities on inspection of external nose and ears. NECK: Normal movements of the head  and neck. CARDIOPULMONARY: No increased WOB. Speaking in clear sentences. I:E ratio WNL.  MS: Moves all visible extremities without noticeable abnormality. PSYCH: Pleasant and cooperative, well-groomed. Speech normal rate and rhythm. Affect is appropriate. Insight and judgement are appropriate. Attention is focused, linear, and appropriate.  NEURO: CN grossly intact. Oriented as arrived to appointment on time with no prompting. Moves both UE equally.  SKIN: No obvious lesions, wounds, erythema, or  cyanosis noted on face or hands.  Assessment and Plan:   Alisha Hughes was seen today for weight gain.  Diagnoses and all orders for this visit:  Weight gain Suspect that her weight gain is due to recent intake of prednisone.  Likely dealing with some slight fluid accumulation.  We are going to increase her lisinopril hydrochlorothiazide to 20-25 mg daily to see if the additional diuretic will help her symptoms.  Also will rule out pregnancy with point of care urine.  Additionally we will obtain a hemoglobin A1c as this is never been checked in her. -     POCT urine pregnancy; Future -     Hemoglobin A1c; Future  Other orders -     lisinopril-hydrochlorothiazide (ZESTORETIC) 20-25 MG tablet; Take 1 tablet by mouth daily.    . Reviewed expectations re: course of current medical issues. . Discussed self-management of symptoms. . Outlined signs and symptoms indicating need for more acute intervention. . Patient verbalized understanding and all questions were answered. Marland Kitchen Health Maintenance issues including appropriate healthy diet, exercise, and smoking avoidance were discussed with patient. . See orders for this visit as documented in the electronic medical record.  I discussed the assessment and treatment plan with the patient. The patient was provided an opportunity to ask questions and all were answered. The patient agreed with the plan and demonstrated an understanding of the instructions.   The patient was advised to call back or seek an in-person evaluation if the symptoms worsen or if the condition fails to improve as anticipated.   [place], [place] 03/04/2019

## 2019-03-07 ENCOUNTER — Other Ambulatory Visit: Payer: Self-pay | Admitting: *Deleted

## 2019-03-07 DIAGNOSIS — Z20822 Contact with and (suspected) exposure to covid-19: Secondary | ICD-10-CM

## 2019-03-08 LAB — NOVEL CORONAVIRUS, NAA: SARS-CoV-2, NAA: NOT DETECTED

## 2019-04-21 ENCOUNTER — Ambulatory Visit: Payer: BC Managed Care – PPO | Attending: Internal Medicine

## 2019-04-21 DIAGNOSIS — Z20822 Contact with and (suspected) exposure to covid-19: Secondary | ICD-10-CM | POA: Diagnosis not present

## 2019-04-23 LAB — NOVEL CORONAVIRUS, NAA: SARS-CoV-2, NAA: NOT DETECTED

## 2019-04-26 ENCOUNTER — Ambulatory Visit: Payer: BC Managed Care – PPO | Attending: Internal Medicine

## 2019-04-26 DIAGNOSIS — Z20822 Contact with and (suspected) exposure to covid-19: Secondary | ICD-10-CM | POA: Diagnosis not present

## 2019-04-27 LAB — NOVEL CORONAVIRUS, NAA: SARS-CoV-2, NAA: NOT DETECTED

## 2019-05-04 ENCOUNTER — Encounter: Payer: Self-pay | Admitting: Physician Assistant

## 2019-05-09 ENCOUNTER — Encounter: Payer: Self-pay | Admitting: Physician Assistant

## 2019-05-09 ENCOUNTER — Ambulatory Visit (INDEPENDENT_AMBULATORY_CARE_PROVIDER_SITE_OTHER): Payer: BC Managed Care – PPO | Admitting: Physician Assistant

## 2019-05-09 VITALS — Ht 63.0 in | Wt 175.0 lb

## 2019-05-09 DIAGNOSIS — R635 Abnormal weight gain: Secondary | ICD-10-CM | POA: Diagnosis not present

## 2019-05-09 MED ORDER — SAXENDA 18 MG/3ML ~~LOC~~ SOPN: 0.6000 mg | PEN_INJECTOR | Freq: Every day | SUBCUTANEOUS | 1 refills | Status: DC

## 2019-05-09 NOTE — Progress Notes (Signed)
Virtual Visit via Video   I connected with Alisha Hughes on 05/09/19 at  7:40 AM EST by a video enabled telemedicine application and verified that I am speaking with the correct person using two identifiers. Location patient: Home Location provider: [facility], Office Persons participating in the virtual visit: Fiamma, [place] PA-C, Anselmo Pickler, LPN   I discussed the limitations of evaluation and management by telemedicine and the availability of in person appointments. The patient expressed understanding and agreed to proceed.  I acted as a Education administrator for [employer], [employer], LPN  Subjective:   HPI:   Pt would like to discuss weight gain. She has had approximately 15 pound weight gain since November.  She attributes this to taking prednisone.  She had a similar episode of weight gain with prednisone prior to getting married.  She is currently restricting her calories to 1000 to 1200 cal daily, she is also tried intermittent fasting, stopping all sugary beverages, and increasing her physical activity as much as she can tolerate.  She feels weight gain in her face, thighs, abdomen.  The weight gain is starting to affect her mood.  Denies SI/HI.  Blood pressure last week was checked by an RN, and was 138/75.  She is interested in potentially trying weight loss medication.  Wt Readings from Last 5 Encounters:  05/09/19 175 lb (79.4 kg)  03/04/19 175 lb (79.4 kg)  02/22/19 175 lb 4 oz (79.5 kg)  02/03/19 160 lb (72.6 kg)  04/12/18 160 lb 8 oz (72.8 kg)   Body mass index is 31 kg/m.   ROS: See pertinent positives and negatives per HPI.  Patient Active Problem List   Diagnosis Date Noted  . HSV (herpes simplex virus) infection 02/10/2018  . Hyperlipidemia 02/03/2018  . Abnormal ECG 02/03/2018  . 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 Pain in right knee 09/29/2017  . Overweight 08/12/2017  . Irritable bowel syndrome with diarrhea  07/15/2016  . Vitamin D deficiency 07/15/2016  . Anxiety and depression 02/11/2016  . Essential hypertension 02/08/2013  . Chronic migraine without aura 12/07/2012    Social History   Tobacco Use  . Smoking status: Never Smoker  . Smokeless tobacco: Never Used  Substance Use Topics  . Alcohol use: Yes    Alcohol/week: 0.0 standard drinks    Comment: rarely    Current Outpatient Medications:  .  amLODipine (NORVASC) 10 MG tablet, Take 1 tablet (10 mg total) by mouth daily., Disp: 30 tablet, Rfl: 5 .  lisinopril-hydrochlorothiazide (ZESTORETIC) 20-25 MG tablet, Take 1 tablet by mouth daily., Disp: 90 tablet, Rfl: 0 .  SUMAtriptan (IMITREX) 100 MG tablet, sumatriptan 100 mg tablet PRN, Disp: , Rfl:  .  valACYclovir (VALTREX) 500 MG tablet, Take 1 tablet (500 mg total) by mouth daily. Can increase to twice a day for 5 days in the event of a recurrence, Disp: 30 tablet, Rfl: 12 .  buPROPion (WELLBUTRIN XL) 150 MG 24 hr tablet, Take 150 mg ER PO qam. (Patient not taking: Reported on 05/09/2019), Disp: 30 tablet, Rfl: 0 .  Liraglutide -Weight Management (SAXENDA) 18 MG/3ML SOPN, Inject 0.6 mg into the skin daily., Disp: 1 pen, Rfl: 1  Allergies  Allergen Reactions  . Latex Rash  . Reglan [Metoclopramide] Nausea Only  . Sertraline Rash    Objective:   VITALS: Per patient if applicable, see vitals. GENERAL: Alert, appears well and in no acute distress. HEENT: Atraumatic, conjunctiva clear, no obvious  abnormalities on inspection of external nose and ears. NECK: Normal movements of the head and neck. CARDIOPULMONARY: No increased WOB. Speaking in clear sentences. I:E ratio WNL.  MS: Moves all visible extremities without noticeable abnormality. PSYCH: Pleasant and cooperative, well-groomed. Speech normal rate and rhythm. Affect is appropriate. Insight and judgement are appropriate. Attention is focused, linear, and appropriate.  NEURO: CN grossly intact. Oriented as arrived to appointment  on time with no prompting. Moves both UE equally.  SKIN: No obvious lesions, wounds, erythema, or cyanosis noted on face or hands.  Assessment and Plan:   [name] was seen today for weight gain.  Diagnoses and all orders for this visit:  Weight gain  Other orders -     Liraglutide -Weight Management (SAXENDA) 18 MG/3ML SOPN; Inject 0.6 mg into the skin daily.   Patient is interested in phentermine, however I am reluctant due to labile blood pressures.  We will try daily 0.6 mg Saxenda injections, likely will need a prior authorization.  Discussed potential side effects of medication, and recommended close follow-up in 2 to 4 weeks to discuss how medication is doing.  Patient verbalized understanding to plan.  . Reviewed expectations re: course of current medical issues. . Discussed self-management of symptoms. . Outlined signs and symptoms indicating need for more acute intervention. . Patient verbalized understanding and all questions were answered. Marland Kitchen Health Maintenance issues including appropriate healthy diet, exercise, and smoking avoidance were discussed with patient. . See orders for this visit as documented in the electronic medical record.  I discussed the assessment and treatment plan with the patient. The patient was provided an opportunity to ask questions and all were answered. The patient agreed with the plan and demonstrated an understanding of the instructions.   The patient was advised to call back or seek an in-person evaluation if the symptoms worsen or if the condition fails to improve as anticipated.   CMA or LPN served as scribe during this visit. History, Physical, and Plan performed by medical provider. The above documentation has been reviewed and is accurate and complete.  [place], [place] 05/09/2019

## 2019-05-10 NOTE — Telephone Encounter (Signed)
Called [facility] and spoke to Dominica and asked her if Kirke Shaggy needs a Prior Authorization? Maybell said yes. Told her I have not received the PA. Dominica said she will send it over now. Told her okay thank you.

## 2019-05-11 NOTE — Telephone Encounter (Signed)
Received fax from pharmacy PA needed for Saxenda. Did PA through Covermymeds.Key: [id number]: [id number] This request cannot be processed due to the medication is not covered by the plan.

## 2019-05-12 ENCOUNTER — Other Ambulatory Visit: Payer: Self-pay | Admitting: Physician Assistant

## 2019-05-12 DIAGNOSIS — R635 Abnormal weight gain: Secondary | ICD-10-CM

## 2019-05-17 ENCOUNTER — Telehealth: Payer: Self-pay | Admitting: *Deleted

## 2019-05-17 NOTE — Telephone Encounter (Signed)
Received fax from CoverMyMeds saying Saxenda denied and to do an appeal. Went to [facility] and did appeal Key: [id number].. waiting on response.

## 2019-06-04 DIAGNOSIS — Z20822 Contact with and (suspected) exposure to covid-19: Secondary | ICD-10-CM | POA: Diagnosis not present

## 2019-06-04 DIAGNOSIS — R519 Headache, unspecified: Secondary | ICD-10-CM | POA: Diagnosis not present

## 2019-06-04 DIAGNOSIS — R6884 Jaw pain: Secondary | ICD-10-CM | POA: Diagnosis not present

## 2019-06-04 DIAGNOSIS — Z91040 Latex allergy status: Secondary | ICD-10-CM | POA: Diagnosis not present

## 2019-06-04 DIAGNOSIS — R079 Chest pain, unspecified: Secondary | ICD-10-CM | POA: Diagnosis not present

## 2019-06-04 DIAGNOSIS — Z79899 Other long term (current) drug therapy: Secondary | ICD-10-CM | POA: Diagnosis not present

## 2019-06-04 DIAGNOSIS — R0789 Other chest pain: Secondary | ICD-10-CM | POA: Diagnosis not present

## 2019-06-04 DIAGNOSIS — M549 Dorsalgia, unspecified: Secondary | ICD-10-CM | POA: Diagnosis not present

## 2019-06-04 DIAGNOSIS — Z888 Allergy status to other drugs, medicaments and biological substances status: Secondary | ICD-10-CM | POA: Diagnosis not present

## 2019-06-04 DIAGNOSIS — M79603 Pain in arm, unspecified: Secondary | ICD-10-CM | POA: Diagnosis not present

## 2019-06-04 DIAGNOSIS — F419 Anxiety disorder, unspecified: Secondary | ICD-10-CM | POA: Diagnosis not present

## 2019-06-04 DIAGNOSIS — R110 Nausea: Secondary | ICD-10-CM | POA: Diagnosis not present

## 2019-06-04 DIAGNOSIS — I10 Essential (primary) hypertension: Secondary | ICD-10-CM | POA: Diagnosis not present

## 2019-06-19 DIAGNOSIS — Z20828 Contact with and (suspected) exposure to other viral communicable diseases: Secondary | ICD-10-CM | POA: Diagnosis not present

## 2019-06-19 DIAGNOSIS — Z03818 Encounter for observation for suspected exposure to other biological agents ruled out: Secondary | ICD-10-CM | POA: Diagnosis not present

## 2019-06-20 ENCOUNTER — Ambulatory Visit: Payer: BC Managed Care – PPO | Attending: Internal Medicine

## 2019-06-20 DIAGNOSIS — Z20822 Contact with and (suspected) exposure to covid-19: Secondary | ICD-10-CM

## 2019-06-21 LAB — NOVEL CORONAVIRUS, NAA: SARS-CoV-2, NAA: NOT DETECTED

## 2019-06-24 ENCOUNTER — Other Ambulatory Visit: Payer: BC Managed Care – PPO

## 2019-06-24 ENCOUNTER — Ambulatory Visit: Payer: BC Managed Care – PPO | Attending: Internal Medicine

## 2019-06-24 DIAGNOSIS — Z20822 Contact with and (suspected) exposure to covid-19: Secondary | ICD-10-CM

## 2019-06-25 LAB — NOVEL CORONAVIRUS, NAA: SARS-CoV-2, NAA: NOT DETECTED

## 2019-06-30 DIAGNOSIS — E669 Obesity, unspecified: Secondary | ICD-10-CM | POA: Diagnosis not present

## 2019-08-09 ENCOUNTER — Other Ambulatory Visit: Payer: Self-pay

## 2019-08-09 ENCOUNTER — Ambulatory Visit (INDEPENDENT_AMBULATORY_CARE_PROVIDER_SITE_OTHER): Payer: BC Managed Care – PPO | Admitting: Obstetrics and Gynecology

## 2019-08-09 ENCOUNTER — Encounter: Payer: Self-pay | Admitting: Obstetrics and Gynecology

## 2019-08-09 ENCOUNTER — Encounter: Payer: Self-pay | Admitting: Radiology

## 2019-08-09 ENCOUNTER — Other Ambulatory Visit (HOSPITAL_COMMUNITY)
Admission: RE | Admit: 2019-08-09 | Discharge: 2019-08-09 | Disposition: A | Discharge status: Home / Self Care | Payer: BC Managed Care – PPO | Source: Ambulatory Visit | Attending: Obstetrics and Gynecology | Admitting: Obstetrics and Gynecology

## 2019-08-09 VITALS — BP 130/85 | HR 72 | Wt 171.0 lb

## 2019-08-09 DIAGNOSIS — R1032 Left lower quadrant pain: Secondary | ICD-10-CM

## 2019-08-09 DIAGNOSIS — Z01419 Encounter for gynecological examination (general) (routine) without abnormal findings: Secondary | ICD-10-CM

## 2019-08-09 DIAGNOSIS — Z3202 Encounter for pregnancy test, result negative: Secondary | ICD-10-CM

## 2019-08-09 DIAGNOSIS — Z1389 Encounter for screening for other disorder: Secondary | ICD-10-CM

## 2019-08-09 DIAGNOSIS — N946 Dysmenorrhea, unspecified: Secondary | ICD-10-CM | POA: Insufficient documentation

## 2019-08-09 DIAGNOSIS — N920 Excessive and frequent menstruation with regular cycle: Secondary | ICD-10-CM | POA: Insufficient documentation

## 2019-08-09 LAB — POCT URINE QUALITATIVE DIPSTICK BLOOD: Blood, UA: NEGATIVE

## 2019-08-09 LAB — POCT URINE PREGNANCY: Preg Test, Ur: NEGATIVE

## 2019-08-09 NOTE — Progress Notes (Signed)
Obstetrics and Gynecology Annual Patient Evaluation  Appointment Date: 08/09/2019  OB[facility]N Clinic: [facility]  Primary Care Provider: Inda Coke  Chief Complaint:  Chief Complaint  Patient presents with  . Gynecologic Exam    History of Present Illness: Alisha Hughes is a 39 y.o. Caucasian [id number] (LMP: one month ago), seen for the above chief complaint. Her past medical history is significant for c-section x 2, BTL, h/o endometrial ablation.   Patient noted some llq pain that started on Sunday, was bad yesterday and better today. Feels like prior cyst. Pt would like pap today.   Review of Systems: Pertinent items noted in HPI and remainder of comprehensive ROS otherwise negative.   Patient Active Problem List   Diagnosis Date Noted  . Hyperlipidemia 02/03/2018  . 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 Pain in right knee 09/29/2017  . Overweight 08/12/2017  . Irritable bowel syndrome with diarrhea 07/15/2016  . Vitamin D deficiency 07/15/2016  . Anxiety and depression 02/11/2016  . Essential hypertension 02/08/2013  . Chronic migraine without aura 12/07/2012    Past Medical History:  Past Medical History:  Diagnosis Date  . Abnormal ECG 02/03/2018  . Anxiety and depression   . HSV (herpes simplex virus) infection 02/10/2018  . Hyperlipidemia   . Hypertension     Past Surgical History:  Past Surgical History:  Procedure Laterality Date  . CESAREAN SECTION    . CESAREAN SECTION N/A 12/19/2012   Procedure: Repeat cesarean section with delivery of baby girl at 0855.    Bilateral tubal ligation with filshie clips.;  Surgeon: Tanya S Pratt, MD;  Location: [facility];  Service: Obstetrics;  Laterality: N/A;  . CHOLECYSTECTOMY  2010  . COLONOSCOPY     20 09  . COLPOSCOPY VULVA W/ BIOPSY     abnormal pap  . ENDOMETRIAL ABLATION N/A 07/17/2015   Procedure: ENDOMETRIAL ABLATION WITH HTA, DILATATION AND CURRATAGE;  Surgeon: Emily Filbert, MD;   Location: [facility];  Service: Gynecology;  Laterality: N/A;  . KNEE ARTHROSCOPY    . LAPAROSCOPIC LYSIS OF ADHESIONS  07/17/2015   Procedure: LAPAROSCOPIC LYSIS OF ADHESIONS;  Surgeon: Emily Filbert, MD;  Location: [facility];  Service: Gynecology;;  . LAPAROSCOPY N/A 07/17/2015   Procedure: LAPAROSCOPY DIAGNOSTIC;  Surgeon: Emily Filbert, MD;  Location: [facility];  Service: Gynecology;  Laterality: N/A;  . TUBAL LIGATION    . UPPER GI ENDOSCOPY    . WISDOM TOOTH EXTRACTION      Past Obstetrical History:  OB History  Gravida Para Term Preterm AB Living  2 2 1 1   2   SAB TAB Ectopic Multiple Live Births          2    # Outcome Date GA Lbr Len/2nd Weight Sex Delivery Anes PTL Lv  2 Term 12/19/12 [redacted]w[redacted]d   F CS-LTranv Spinal    1 Preterm 12/11/09 [redacted]w[redacted]d   F CS-LTranv EPI  LIV    Past Gynecological History: As per HPI. Periods: qmonth, regular, 5-7d, heavy and painful. Prior to ablation, she would have periods that lasted almost the entire month History of Pap Smear(s): Yes.   Last pap 2019, which was neg and hpv neg  She is currently using bilateral tubal ligation for contraception.   Social History:  Social History   Socioeconomic History  . Marital status: Married    Spouse name: Not on file  . Number of children: Not on file  . Years of education: Not  on file  . Highest education level: Not on file  Occupational History  . Not on file  Tobacco Use  . Smoking status: Never Smoker  . Smokeless tobacco: Never Used  Substance and Sexual Activity  . Alcohol use: Yes    Alcohol/week: 0.0 standard drinks    Comment: rarely  . Drug use: No  . Sexual activity: Yes    Partners: Male    Birth control/protection: Surgical  Other Topics Concern  . 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 Difficulty of Paying Living Expenses:   Food Insecurity:   . Worried About Charity fundraiser in the Last Year:   . Arboriculturist in the Last Year:   Transportation Needs:   . Film/video editor (Medical):   Marland Kitchen Lack of Transportation (Non-Medical):   Physical Activity:   . Days of Exercise per Week:   . Minutes of Exercise per Session:   Stress:   . Feeling of Stress :   Social Connections:   . Frequency of Communication with Friends and Family:   . Frequency of Social Gatherings with Friends and Family:   . Attends Religious Services:   . Active Member of Clubs or Organizations:   . Attends Archivist Meetings:   Marland Kitchen Marital Status:   Intimate Partner Violence:   . Fear of Current or Ex-Partner:   . Emotionally Abused:   Marland Kitchen Physically Abused:   . Sexually Abused:     Family History:  Family History  Problem Relation Age of Onset  . Hypertension Mother   . Anuerysm Mother        brain  . Migraines Mother   . Heart disease Father   . Hypertension Father   . Hyperlipidemia Father   . Prostate cancer Father   . Depression Brother   . Multiple sclerosis Maternal Grandmother   . Sudden death Maternal Grandmother   . Heart disease Maternal Grandfather   . Sudden death Maternal Grandfather   . Heart disease Paternal Grandmother   . Diabetes Paternal Grandmother   . Parkinson's disease Paternal Grandfather     Medications Alisha Hughes had no medications administered during this visit. Current Outpatient Medications  Medication Sig Dispense Refill  . amLODipine (NORVASC) 10 MG tablet Take 1 tablet (10 mg total) by mouth daily. 30 tablet 5  . lisinopril-hydrochlorothiazide (ZESTORETIC) 20-25 MG tablet Take 1 tablet by mouth daily. 90 tablet 0   No current facility-administered medications for this visit.    Allergies Latex, Reglan [metoclopramide], and Sertraline   Physical Exam:  BP 130/85    Pulse 72   Wt 171 lb (77.6 kg)   BMI 30.29 kg/m  Body mass index is 30.29 kg/m. General appearance: Well nourished, well developed female in no acute distress.  Neck:  Supple, normal appearance, and no thyromegaly  Cardiovascular: normal s1 and s2.  No murmurs, rubs or gallops. Respiratory:  Clear to auscultation bilateral. Normal respiratory effort Abdomen: positive bowel sounds and no masses, hernias; diffusely non tender to palpation, non distended Breasts: breasts appear normal, no suspicious masses, no skin or nipple changes or axillary nodes, and normal palpation. Neuro/Psych:  Normal mood and affect.  Skin:  Warm and dry.  Lymphatic:  No inguinal lymphadenopathy.   Pelvic exam: is not limited by body habitus EGBUS: within normal limits Vagina: within normal limits and with no blood or discharge in the vault Cervix: normal appearing cervix without tenderness, discharge or lesions.  Uterus:  nonenlarged and non tender Adnexa:  normal adnexa and no mass, fullness, tenderness Rectovaginal: deferred  Laboratory: negative u/a, upt  Radiology: no new imaging  Assessment: pt stable  Plan:  1. Well woman exam Routine care. I d/w her that if pap normal that she can do routine pap screening - Cytology - PAP( [place])  2. LLQ pain Likely cyst pain. See below   3. Dysmenorrhea and Menorrhagia I d/w her that options would include depo provera or progestin only pills. She's tried DP and OCPs in the past and didn't like them due to weight gain. I also told her that the only surgical option at this point would be a hysterectomy. I told her that she can also do exp management if her period s/s are managable and that the ablation did what it was intended which is to give her a regular, monthly period. I also told her that only hormones and not a hyst, since I recommend leaving her ovaries, would help manage any cysts in the future.  Pt would like to do exp management and keep an eye  on her s/s for now.    RTC 1 year  Aletha Halim, Brooke Bonito MD Attending [facility] [employer] farm manager)

## 2019-08-09 NOTE — Progress Notes (Signed)
Patient reports having painful periods the last couple of months.  Last pap 2019- normal

## 2019-08-10 LAB — CYTOLOGY - PAP
Comment: NEGATIVE
Diagnosis: NEGATIVE
High risk HPV: NEGATIVE

## 2019-08-10 NOTE — Addendum Note (Signed)
Addended by: Crosby Oyster on: 08/10/2019 09:42 AM   Modules accepted: Orders

## 2019-08-15 ENCOUNTER — Encounter: Payer: Self-pay | Admitting: Physician Assistant

## 2019-08-16 ENCOUNTER — Other Ambulatory Visit: Payer: Self-pay | Admitting: Physician Assistant

## 2019-08-16 MED ORDER — NITROFURANTOIN MONOHYD MACRO 100 MG PO CAPS: 100.0000 mg | ORAL_CAPSULE | Freq: Every day | ORAL | 3 refills | Status: DC | PRN

## 2019-08-23 ENCOUNTER — Encounter: Payer: Self-pay | Admitting: Physician Assistant

## 2019-08-23 MED ORDER — VALACYCLOVIR HCL 500 MG PO TABS: 500.0000 mg | ORAL_TABLET | Freq: Every day | ORAL | 5 refills | Status: DC

## 2019-08-25 ENCOUNTER — Telehealth: Payer: BC Managed Care – PPO | Admitting: Family

## 2019-08-25 DIAGNOSIS — B001 Herpesviral vesicular dermatitis: Secondary | ICD-10-CM | POA: Diagnosis not present

## 2019-08-25 MED ORDER — VALACYCLOVIR HCL 1 G PO TABS: 1000.0000 mg | ORAL_TABLET | Freq: Two times a day (BID) | ORAL | 0 refills | Status: DC

## 2019-08-25 MED ORDER — ACYCLOVIR 5 % EX OINT
1.0000 | TOPICAL_OINTMENT | CUTANEOUS | 0 refills | Status: DC
Start: 2019-08-25 — End: 2020-01-04

## 2019-08-25 NOTE — Progress Notes (Signed)
We are sorry that you are not feeling well.  Here is how we plan to help!  Based on what you have shared with me it does look like you have a viral infection.    Most cold sores or fever blisters are small fluid filled blisters around the mouth caused by herpes simplex virus.  The most common strain of the virus causing cold sores is herpes simplex virus 1.  It can be spread by skin contact, sharing eating utensils, or even sharing towels.  Cold sores are contagious to other people until dry. (Approximately 5-7 days).  Wash your hands. You can spread the virus to your eyes through handling your contact lenses after touching the lesions.  Most people experience pain at the sight or tingling sensations in their lips that may begin before the ulcers erupt.  Herpes simplex is treatable but not curable.  It may lie dormant for a long time and then reappear due to stress or prolonged sun exposure.  Many patients have success in treating their cold sores with an over the counter topical called Abreva.  You may apply the cream up to 5 times daily (maximum 10 days) until healing occurs.  If you would like to use an oral antiviral medication to speed the healing of your cold sore, I have sent a prescription to your local pharmacy Valacyclovir 2 gm take one by mouth twice a day for 1 day    HOME CARE:   Wash your hands frequently.  Do not pick at or rub the sore.  Don't open the blisters.  Avoid kissing other people during this time.  Avoid sharing drinking glasses, eating utensils, or razors.  Do not handle contact lenses unless you have thoroughly washed your hands with soap and warm water!  Avoid oral sex during this time.  Herpes from sores on your mouth can spread to your partner's genital area.  Avoid contact with anyone who has eczema or a weakened immune system.  Cold sores are often triggered by exposure to intense sunlight, use a lip balm containing a sunscreen (SPF 30 or  higher).  GET HELP RIGHT AWAY IF:   Blisters look infected.  Blisters occur near or in the eye.  Symptoms last longer than 10 days.  Your symptoms become worse.  MAKE SURE YOU:   Understand these instructions.  Will watch your condition.  Will get help right away if you are not doing well or get worse.    Your e-visit answers were reviewed by a board certified advanced clinical practitioner to complete your personal care plan.  Depending upon the condition, your plan could have  Included both over the counter or prescription medications.    Please review your pharmacy choice.  Be sure that the pharmacy you have chosen is open so that you can pick up your prescription now.  If there is a problem you can message your provider in MyChart to have the prescription routed to another pharmacy.    Your safety is important to us.  If you have drug allergies check our prescription carefully.  For the next 24 hours you can use MyChart to ask questions about today's visit, request a non-urgent call back, or ask for a work or school excuse from your e-visit provider.  You will get an email in the next two days asking about your experience.  I hope that your e-visit has been valuable and will speed your recovery.   Greater than 5 minutes, yet less   10 minutes of time have been spent researching, coordinating, and implementing care for this patient today.  Thank you for the details you included in the comment boxes. Those details are very helpful in determining the best course of treatment for you and help us to provide the best care.  

## 2019-08-25 NOTE — Addendum Note (Signed)
Addended by: Dutch Quint B on: 08/25/2019 01:47 PM   Modules accepted: Orders

## 2019-10-12 DIAGNOSIS — Z719 Counseling, unspecified: Secondary | ICD-10-CM | POA: Diagnosis not present

## 2019-10-19 DIAGNOSIS — Z789 Other specified health status: Secondary | ICD-10-CM | POA: Diagnosis not present

## 2019-10-24 DIAGNOSIS — Z789 Other specified health status: Secondary | ICD-10-CM | POA: Diagnosis not present

## 2019-10-26 DIAGNOSIS — Z789 Other specified health status: Secondary | ICD-10-CM | POA: Diagnosis not present

## 2020-01-02 DIAGNOSIS — Z20822 Contact with and (suspected) exposure to covid-19: Secondary | ICD-10-CM | POA: Diagnosis not present

## 2020-01-02 DIAGNOSIS — Z03818 Encounter for observation for suspected exposure to other biological agents ruled out: Secondary | ICD-10-CM | POA: Diagnosis not present

## 2020-01-04 ENCOUNTER — Ambulatory Visit (INDEPENDENT_AMBULATORY_CARE_PROVIDER_SITE_OTHER): Payer: BC Managed Care – PPO

## 2020-01-04 ENCOUNTER — Ambulatory Visit
Admission: EM | Admit: 2020-01-04 | Discharge: 2020-01-04 | Disposition: A | Discharge status: Home / Self Care | Payer: BC Managed Care – PPO

## 2020-01-04 ENCOUNTER — Encounter: Payer: Self-pay | Admitting: Emergency Medicine

## 2020-01-04 ENCOUNTER — Other Ambulatory Visit: Payer: Self-pay

## 2020-01-04 ENCOUNTER — Ambulatory Visit
Admission: EM | Admit: 2020-01-04 | Discharge: 2020-01-04 | Disposition: A | Discharge status: Home / Self Care | Payer: BC Managed Care – PPO | Attending: Emergency Medicine | Admitting: Emergency Medicine

## 2020-01-04 ENCOUNTER — Ambulatory Visit: Payer: Self-pay

## 2020-01-04 DIAGNOSIS — J069 Acute upper respiratory infection, unspecified: Secondary | ICD-10-CM | POA: Diagnosis not present

## 2020-01-04 DIAGNOSIS — R05 Cough: Secondary | ICD-10-CM

## 2020-01-04 DIAGNOSIS — I10 Essential (primary) hypertension: Secondary | ICD-10-CM | POA: Diagnosis not present

## 2020-01-04 DIAGNOSIS — J019 Acute sinusitis, unspecified: Secondary | ICD-10-CM

## 2020-01-04 DIAGNOSIS — E785 Hyperlipidemia, unspecified: Secondary | ICD-10-CM | POA: Diagnosis not present

## 2020-01-04 DIAGNOSIS — Z79899 Other long term (current) drug therapy: Secondary | ICD-10-CM | POA: Insufficient documentation

## 2020-01-04 DIAGNOSIS — K580 Irritable bowel syndrome with diarrhea: Secondary | ICD-10-CM | POA: Insufficient documentation

## 2020-01-04 DIAGNOSIS — R509 Fever, unspecified: Secondary | ICD-10-CM

## 2020-01-04 DIAGNOSIS — Z20822 Contact with and (suspected) exposure to covid-19: Secondary | ICD-10-CM | POA: Diagnosis not present

## 2020-01-04 LAB — SARS CORONAVIRUS 2 (TAT 6-24 HRS): SARS Coronavirus 2: NEGATIVE

## 2020-01-04 LAB — GROUP A STREP BY PCR: Group A Strep by PCR: NOT DETECTED

## 2020-01-04 MED ORDER — HYDROCOD POLST-CPM POLST ER 10-8 MG/5ML PO SUER: 5.0000 mL | Freq: Two times a day (BID) | ORAL | 0 refills | Status: DC | PRN

## 2020-01-04 MED ORDER — IBUPROFEN 600 MG PO TABS
600.0000 mg | ORAL_TABLET | Freq: Four times a day (QID) | ORAL | 0 refills | Status: DC | PRN
Start: 2020-01-04 — End: 2020-06-24

## 2020-01-04 MED ORDER — AEROCHAMBER PLUS MISC: 2 refills | Status: DC

## 2020-01-04 MED ORDER — AMOXICILLIN-POT CLAVULANATE 875-125 MG PO TABS
1.0000 | ORAL_TABLET | Freq: Two times a day (BID) | ORAL | 0 refills | Status: DC
Start: 2020-01-04 — End: 2020-06-24

## 2020-01-04 NOTE — Discharge Instructions (Addendum)
Finish the Augmentin, even if you feel better.  This will treat a sinus infection.  Your chest x-ray was negative for pneumonia.  Your Covid test will be back in 24 hours.  You will be a candidate for monoclonal antibody infusion.  Tessalon for the cough during the day, Tussionex for the cough at night.  You may take 600 mg of motrin with 1 gram of tylenol up to 3-4 times a day as needed for pain. This is an effective combination for pain.  2 puffs from your albuterol inhaler using your inhaler every 4-6 hours as needed for coughing, chest tightness, shortness of breath.   use a NeilMed sinus rinse as often as you want to to reduce nasal congestion. Follow the directions on the box.   Go to www.goodrx.com to look up your medications. This will give you a list of where you can find your prescriptions at the most affordable prices. Or you can ask the pharmacist what the cash price is. This is frequently cheaper than going through insurance.

## 2020-01-04 NOTE — ED Triage Notes (Signed)
Pt is here with a sore throat that started last Wednesday and a cough that started Saturday. Pt was tested Monday for Strep & COVID both were Negative, pt had an evisit Monday & was prescribed Telsson Pearls and Albuterol inhaler to relieve discomfort.

## 2020-01-04 NOTE — ED Provider Notes (Signed)
HPI  SUBJECTIVE:  Patient reports sore throat, headache starting 1 week ago. Sx worse with swallowing.  No aggravating, alleviating factors.  She has tried alternating 800 mg ibuprofen with 500 mg of Tylenol 3-4 times a day. + Fever starting 2 days ago T-max 101   No neck stiffness  + Nonproductive cough starting 2 days ago.  Reports chest soreness secondary to coughing.  States that she cannot sleep secondary to the cough. No wheezing, shortness of breath. + nasal congestion, clear rhinorrhea + Myalgias + Frontal headache/sinus pain and pressure.  Mild photophobia.  no Rash  No loss of taste or smell No difficulty breathing No nausea, vomiting + diarrhea-attributing this to IBS + Intermittent crampy abdominal pain      No Recent  COVID exposure + No reflux sxs No Allergy sxs  No Breathing difficulty, voice changes, sensation of throat swelling shut No Drooling No Trismus  Got both doses of the Covid vaccine, last dose in July + antipyretic in past 4-6 hr-took Tylenol Had a negative Covid PCR test 1 week ago, the same day symptoms started on 9/16.  She had a negative Covid and strep rapid test 2 days ago.  She had an E-visit and was prescribed Tessalon, albuterol which patient states is not helping. Past medical history of frequent sinusitis, IBS, pneumonia, headaches, hypertension.  No history of diabetes, mono, asthma, smoking, coronary disease, chronic kidney disease, HIV, cancer, immunocompromise. LMP: Last week.  Denies the possibility being pregnant. [id number], [place], [place]     Past Medical History:  Diagnosis Date  . Abnormal ECG 02/03/2018  . Anxiety and depression   . HSV (herpes simplex virus) infection 02/10/2018  . Hyperlipidemia   . Hypertension     Past Surgical History:  Procedure Laterality Date  . CESAREAN SECTION    . CESAREAN SECTION N/A 12/19/2012   Procedure: Repeat cesarean section with delivery of baby girl at [place].    Bilateral tubal ligation  with filshie clips.;  Surgeon: Donnamae Jude, MD;  Location: [facility];  Service: Obstetrics;  Laterality: N/A;  . CHOLECYSTECTOMY  2010  . COLONOSCOPY     2009  . COLPOSCOPY VULVA W/ BIOPSY     abnormal pap  . ENDOMETRIAL ABLATION N/A 07/17/2015   Procedure: ENDOMETRIAL ABLATION WITH HTA, DILATATION AND CURRATAGE;  Surgeon: Emily Filbert, MD;  Location: [facility];  Service: Gynecology;  Laterality: N/A;  . KNEE ARTHROSCOPY    . LAPAROSCOPIC LYSIS OF ADHESIONS  07/17/2015   Procedure: LAPAROSCOPIC LYSIS OF ADHESIONS;  Surgeon: Emily Filbert, MD;  Location: [facility];  Service: Gynecology;;  . LAPAROSCOPY N/A 07/17/2015   Procedure: LAPAROSCOPY DIAGNOSTIC;  Surgeon: Emily Filbert, MD;  Location: [facility];  Service: Gynecology;  Laterality: N/A;  . TUBAL LIGATION    . UPPER GI ENDOSCOPY    . WISDOM TOOTH EXTRACTION      Family History  Problem Relation Age of Onset  . Hypertension Mother   . Anuerysm Mother        brain  . Migraines Mother   . Heart disease Father   . Hypertension Father   . Hyperlipidemia Father   . Prostate cancer Father   . Depression Brother   . Multiple sclerosis Maternal Grandmother   . Sudden death Maternal Grandmother   . Heart disease Maternal Grandfather   . Sudden death Maternal Grandfather   . Heart disease Paternal Grandmother   . Diabetes Paternal Grandmother   . Parkinson's disease Paternal Grandfather  Social History   Tobacco Use  . Smoking status: Never Smoker  . Smokeless tobacco: Never Used  Vaping Use  . Vaping Use: Never used  Substance Use Topics  . Alcohol use: Yes    Alcohol/week: 0.0 standard drinks    Comment: rarely  . Drug use: No    No current facility-administered medications for this encounter.  Current Outpatient Medications:  .  amLODipine (NORVASC) 10 MG tablet, Take 1 tablet (10 mg total) by mouth daily., Disp: 30 tablet, Rfl: 5 .  lisinopril-hydrochlorothiazide (ZESTORETIC) 20-25 MG tablet, Take 1 tablet by mouth daily., Disp: 90  tablet, Rfl: 0 .  valACYclovir (VALTREX) 500 MG tablet, Take 1 tablet (500 mg total) by mouth daily. Can increase to twice a day for 5 days in the event of a recurrence, Disp: 30 tablet, Rfl: 5 .  amoxicillin-clavulanate (AUGMENTIN) 875-125 MG tablet, Take 1 tablet by mouth 2 (two) times daily. X 7 days, Disp: 14 tablet, Rfl: 0 .  chlorpheniramine-HYDROcodone (TUSSIONEX PENNKINETIC ER) 10-8 MG/5ML SUER, Take 5 mLs by mouth every 12 (twelve) hours as needed for cough., Disp: 60 mL, Rfl: 0 .  ibuprofen (ADVIL) 600 MG tablet, Take 1 tablet (600 mg total) by mouth every 6 (six) hours as needed., Disp: 30 tablet, Rfl: 0 .  nitrofurantoin, macrocrystal-monohydrate, (MACROBID) 100 MG capsule, Take 1 capsule (100 mg total) by mouth daily as needed (after intercourse)., Disp: 30 capsule, Rfl: 3 .  Spacer/Aero-Holding Chambers (AEROCHAMBER PLUS) inhaler, Use as instructed, Disp: 1 each, Rfl: 2  Allergies  Allergen Reactions  . Latex Rash  . Metoclopramide Nausea Only and Nausea And Vomiting    Other reaction(s): Other (See Comments) Dizziness Dizziness Dizziness Dizziness  . Sertraline Rash     ROS  As noted in HPI.   Physical Exam  BP (!) 138/104 (BP Location: Right Arm)   Pulse 93   Temp 98.6 F (37 C) (Oral)   Resp 18   Ht 5\' 3"  (1.6 m)   Wt 77.1 kg   LMP 12/23/2019 (Approximate)   SpO2 100%   BMI 30.11 kg/m   Constitutional: Well developed, well nourished, no acute distress Eyes:  EOMI, conjunctiva normal bilaterally HENT: Normocephalic, atraumatic,mucus membranes moist. + clear nasal congestion + erythematous, swollen turbinates.  No maxillary, frontal sinus tenderness.  + erythematous oropharynx  - enlarged tonsils  - exudates. Uvula midline..  Extensive postnasal drip Respiratory: Normal inspiratory effort, single ronchi RUL.  Positive Anterior and lateral chest wall tenderness Cardiovascular: Normal rate, no murmurs, rubs, gallops GI: Soft, nondistended, nontender. No  appreciable splenomegaly skin: No rash, skin intact Lymph: -  Anterior cervical LN.  No posterior cervical lymphadenopathy Musculoskeletal: no deformities Neurologic: Alert & oriented x 3, no focal neuro deficits Psychiatric: Speech and behavior appropriate.   ED Course   Medications - No data to display  Orders Placed This Encounter  Procedures  . Group A Strep by PCR    Standing Status:   Standing    Number of Occurrences:   1  . SARS CORONAVIRUS 2 (TAT 6-24 HRS) Nasopharyngeal Nasopharyngeal Swab    Standing Status:   Standing    Number of Occurrences:   1    Order Specific Question:   Is this test for diagnosis or screening    Answer:   Diagnosis of ill patient    Order Specific Question:   Symptomatic for COVID-19 as defined by CDC    Answer:   Yes    Order Specific Question:  Date of Symptom Onset    Answer:   12/28/2019    Order Specific Question:   Hospitalized for COVID-19    Answer:   No    Order Specific Question:   Admitted to ICU for COVID-19    Answer:   No    Order Specific Question:   Previously tested for COVID-19    Answer:   Yes    Order Specific Question:   Resident in a congregate (group) care setting    Answer:   No    Order Specific Question:   Employed in healthcare setting    Answer:   No    Order Specific Question:   Pregnant    Answer:   No    Order Specific Question:   Has patient completed COVID vaccination(s) (2 doses of Pfizer/Moderna 1 dose of [employer])    Answer:   Yes  . DG Chest 2 View    Standing Status:   Standing    Number of Occurrences:   1    Order Specific Question:   Reason for Exam (SYMPTOM  OR DIAGNOSIS REQUIRED)    Answer:   cough fever r/o PNA    Results for orders placed or performed during the hospital encounter of 01/04/20 (from the past 24 hour(s))  Group A Strep by PCR     Status: None   Collection Time: 01/04/20 11:53 AM   Specimen: Throat; Sterile Swab  Result Value Ref Range   Group A Strep by PCR NOT  DETECTED NOT DETECTED   DG Chest 2 View  Result Date: 01/04/2020 CLINICAL DATA:  Fever and cough. EXAM: CHEST - 2 VIEW COMPARISON:  Chest x-ray dated February 03, 2019. FINDINGS: The heart size and mediastinal contours are within normal limits. Both lungs are clear. The visualized skeletal structures are unremarkable. IMPRESSION: No active cardiopulmonary disease. Electronically Signed   By: Titus Dubin M.D.   On: 01/04/2020 12:43    ED Clinical Impression  1. Upper respiratory tract infection, unspecified type   2. Acute non-recurrent sinusitis, unspecified location    ED Assessment/Plan   Strep PCR negative.  Checking Covid.  She will be a candidate for monoclonal antibody infusion because of hypertension and BMI.  Given that she has a new cough with fever, will check chest x-ray.  Imaging independently reviewed.  No pneumonia.  See radiology report for details.  [place] chronic database reviewed.  No opiate prescriptions since 2020.  Chest x-ray negative for pneumonia.  Strep negative.  Patient most likely with viral illness, however given duration of symptoms, new fever, and the sinus pain and pressure, will send home with Augmentin.  Tussionex, albuterol spacer to go with the inhaler, Tylenol/ibuprofen, saline nasal irrigation.  Patient states Flonase usually "gives her sinus infection".    Patient to followup with PMD when necessary.  To the ER if she gets worse  Discussed labs,  MDM, plan and followup with patient. Discussed sn/sx that should prompt return to the ED. patient agrees with plan.   Meds ordered this encounter  Medications  . amoxicillin-clavulanate (AUGMENTIN) 875-125 MG tablet    Sig: Take 1 tablet by mouth 2 (two) times daily. X 7 days    Dispense:  14 tablet    Refill:  0  . ibuprofen (ADVIL) 600 MG tablet    Sig: Take 1 tablet (600 mg total) by mouth every 6 (six) hours as needed.    Dispense:  30 tablet    Refill:  0  . chlorpheniramine-HYDROcodone  (TUSSIONEX PENNKINETIC ER) 10-8 MG/5ML SUER    Sig: Take 5 mLs by mouth every 12 (twelve) hours as needed for cough.    Dispense:  60 mL    Refill:  0  . Spacer/Aero-Holding Chambers (AEROCHAMBER PLUS) inhaler    Sig: Use as instructed    Dispense:  1 each    Refill:  2     *This clinic note was created using Dragon dictation software. Therefore, there may be occasional mistakes despite careful proofreading.     Melynda Ripple, MD 01/04/20 [phone]

## 2020-01-04 NOTE — ED Triage Notes (Signed)
Patient in today c/o headache, sore throat x 1 week, cough x 4 days, runny nose x today and body aches x 2 days. Fever (99.9-101) since yesterday. Patient states she had a covid and strep test at work last Wednesday (12/28/19) and both were negative. Patient did a virtual visit on Monday (01/02/20) and was prescribed Tessalon and an Albuterol inhaler without relief.

## 2020-01-11 ENCOUNTER — Ambulatory Visit: Payer: BC Managed Care – PPO | Admitting: Physician Assistant

## 2020-01-13 ENCOUNTER — Ambulatory Visit: Payer: BC Managed Care – PPO | Admitting: Physician Assistant

## 2020-02-23 ENCOUNTER — Ambulatory Visit (INDEPENDENT_AMBULATORY_CARE_PROVIDER_SITE_OTHER): Payer: BC Managed Care – PPO

## 2020-02-23 DIAGNOSIS — Z23 Encounter for immunization: Secondary | ICD-10-CM | POA: Diagnosis not present

## 2020-03-01 ENCOUNTER — Ambulatory Visit: Payer: BC Managed Care – PPO | Admitting: Obstetrics & Gynecology

## 2020-03-27 ENCOUNTER — Telehealth: Payer: BC Managed Care – PPO | Admitting: Physician Assistant

## 2020-03-27 DIAGNOSIS — T7840XA Allergy, unspecified, initial encounter: Secondary | ICD-10-CM

## 2020-03-27 MED ORDER — FLUTICASONE PROPIONATE 50 MCG/ACT NA SUSP: 2.0000 | Freq: Every day | NASAL | 0 refills | Status: DC

## 2020-03-27 MED ORDER — PREDNISONE 20 MG PO TABS: 40.0000 mg | ORAL_TABLET | Freq: Every day | ORAL | 0 refills | Status: DC

## 2020-03-27 MED ORDER — FAMOTIDINE 20 MG PO TABS: 20.0000 mg | ORAL_TABLET | Freq: Two times a day (BID) | ORAL | 0 refills | Status: DC

## 2020-03-27 NOTE — Progress Notes (Signed)
E visit for Allergic Rhinitis We are sorry that you are not feeling well.  Here is how we plan to help!  Based on what you have shared with me it looks like you have Allergic Rhinitis.  Rhinitis is when a reaction occurs that causes nasal congestion, runny nose, sneezing, and itching.  Most types of rhinitis are caused by an inflammation and are associated with symptoms in the eyes ears or throat. There are several types of rhinitis.  The most common are acute rhinitis, which is usually caused by a viral illness, allergic or seasonal rhinitis, and nonallergic or year-round rhinitis.  Nasal allergies occur certain times of the year.  Allergic rhinitis is caused when allergens in the air trigger the release of histamine in the body.  Histamine causes itching, swelling, and fluid to build up in the fragile linings of the nasal passages, sinuses and eyelids.  An itchy nose and clear discharge are common.  I recommend the following over the counter treatments: OTC - Zyrtec 10mg  2x per day for 5 days  I also would recommend a nasal spray: Flonase 2 sprays into each nostril twice daily  I would also recommend famotidine and prednisone.  These prescriptions will be sent to your pharmacy.  HOME CARE:   You can use an over-the-counter saline nasal spray as needed  Avoid areas where there is heavy dust, mites, or molds  Stay indoors on windy days during the pollen season  Keep windows closed in home, at least in bedroom; use air conditioner.  Use high-efficiency house air filter  Keep windows closed in car, turn AC on re-circulate  Avoid playing out with dog during pollen season  GET HELP RIGHT AWAY IF:   If your symptoms do not improve within 10 days  You become short of breath  You develop yellow or green discharge from your nose for over 3 days  You have coughing fits  MAKE SURE YOU:   Understand these instructions  Will watch your condition  Will get help right away if you  are not doing well or get worse  Thank you for choosing an e-visit. Your e-visit answers were reviewed by a board certified advanced clinical practitioner to complete your personal care plan. Depending upon the condition, your plan could have included both over the counter or prescription medications. Please review your pharmacy choice. Be sure that the pharmacy you have chosen is open so that you can pick up your prescription now.  If there is a problem you may message your provider in [place] to have the prescription routed to another pharmacy. Your safety is important to [place]. If you have drug allergies check your prescription carefully.  For the next 24 hours, you can use MyChart to ask questions about today's visit, request a non-urgent call back, or ask for a work or school excuse from your e-visit provider. You will get an email in the next two days asking about your experience. I hope that your e-visit has been valuable and will speed your recovery.       Greater than 5 minutes, yet less than 10 minutes of time have been spent researching, coordinating, and implementing care for this patient today

## 2020-04-17 ENCOUNTER — Ambulatory Visit: Payer: BC Managed Care – PPO | Admitting: Family Medicine

## 2020-04-17 ENCOUNTER — Encounter: Payer: Self-pay | Admitting: Family Medicine

## 2020-04-17 NOTE — Progress Notes (Signed)
Patient did not keep appointment today. She may call to reschedule.  

## 2020-04-26 ENCOUNTER — Ambulatory Visit: Payer: BC Managed Care – PPO | Admitting: Dermatology

## 2020-04-26 ENCOUNTER — Ambulatory Visit: Payer: BC Managed Care – PPO | Admitting: Obstetrics and Gynecology

## 2020-04-26 ENCOUNTER — Other Ambulatory Visit: Payer: Self-pay

## 2020-04-26 ENCOUNTER — Encounter: Payer: Self-pay | Admitting: Dermatology

## 2020-04-26 DIAGNOSIS — H01119 Allergic dermatitis of unspecified eye, unspecified eyelid: Secondary | ICD-10-CM

## 2020-04-26 DIAGNOSIS — D489 Neoplasm of uncertain behavior, unspecified: Secondary | ICD-10-CM

## 2020-04-26 DIAGNOSIS — Z20822 Contact with and (suspected) exposure to covid-19: Secondary | ICD-10-CM | POA: Diagnosis not present

## 2020-04-26 DIAGNOSIS — Z03818 Encounter for observation for suspected exposure to other biological agents ruled out: Secondary | ICD-10-CM | POA: Diagnosis not present

## 2020-04-26 MED ORDER — HYDROCORTISONE 2.5 % EX OINT: TOPICAL_OINTMENT | Freq: Two times a day (BID) | CUTANEOUS | 0 refills | Status: DC

## 2020-04-26 NOTE — Progress Notes (Signed)
   Follow-Up Visit   Subjective  Alisha Hughes is a 40 y.o. female who presents for the following: Rash (Pt c/o dry itchy rough skin on the right top eyelid started about 3 weeks ago after pt had fake eyelashes, pt removed the eyelashes but she still has a rash ).   The following portions of the chart were reviewed this encounter and updated as appropriate:  Tobacco  Allergies  Meds  Problems  Med Hx  Surg Hx  Fam Hx      Objective  Well appearing patient in no apparent distress; mood and affect are within normal limits.  A focused examination was performed including right eyelid and right aerola . Relevant physical exam findings are noted in the Assessment and Plan.  Objective  Right eyelid: Scaly erythematous patches   Objective  left areola: 0.3 cm erythamtous skin colored papule   Assessment & Plan  Eyelid dermatitis, allergic/contact Right eyelid  ACD vs irritant contact dermatitis secondary to eyelash extensions.  Start Hydrocortisone 2.5 % ointment twice a day for up to a week.   Topical steroids (such as triamcinolone, fluocinolone, fluocinonide, mometasone, clobetasol, halobetasol, betamethasone, hydrocortisone) can cause thinning and lightening of the skin if they are used for too long in the same area. Your physician has selected the right strength medicine for your problem and area affected on the body. Please use your medication only as directed by your physician to prevent side effects.   Discussed option of latisse and possible side effects. Deferred today.   Ordered Medications: hydrocortisone 2.5 % ointment  Neoplasm of uncertain behavior left areola  Plan snip removal at follow up   R/o irritated skin tag vs verruca vs other   Return in about 2 months (around 06/24/2020) for follow up on skin tag.  I, Ruthell Rummage, CMA, am acting as scribe for Forest Gleason, MD.  Documentation: I have reviewed the above documentation for accuracy and  completeness, and I agree with the above.  Forest Gleason, MD

## 2020-05-03 ENCOUNTER — Other Ambulatory Visit: Payer: BC Managed Care – PPO

## 2020-06-02 IMAGING — CT CT RENAL STONE PROTOCOL
3 of 4 series · 9 of 46 positions shown, 14 images · non-contrast
Comparison: None.

CLINICAL DATA: Pelvic pain with hematuria

EXAM:
CT ABDOMEN AND PELVIS WITHOUT CONTRAST
TECHNIQUE: Multidetector CT imaging of the abdomen and pelvis was performed
following the standard protocol without oral or IV contrast.

[Series 4: lung bases · axial · 0.64mm/px · z∈[-574,-494]mm · 5 of 26 slices shown, 10 images]
[im 5/26  soft-tissue]
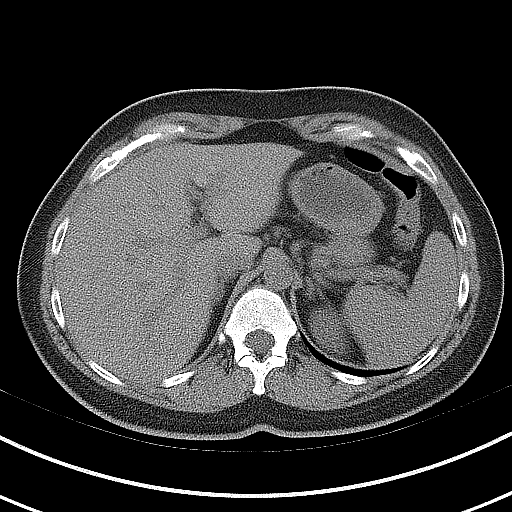
[im 5/26  bone]
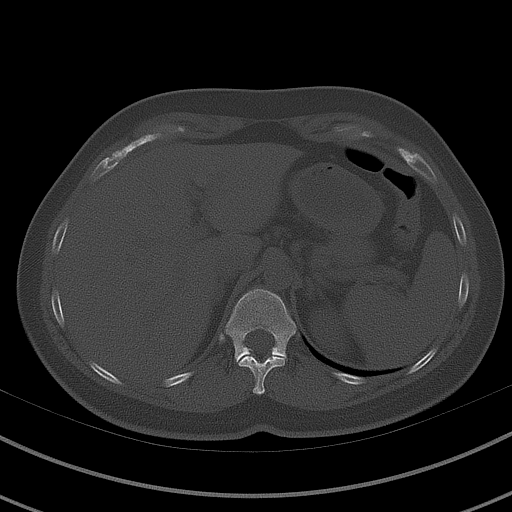
[im 9/26  soft-tissue]
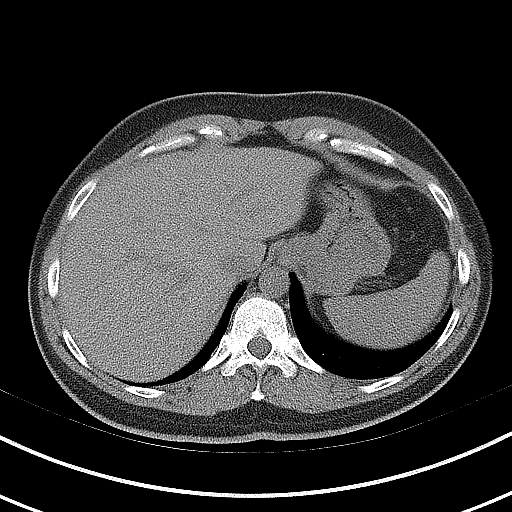
[im 9/26  lung]
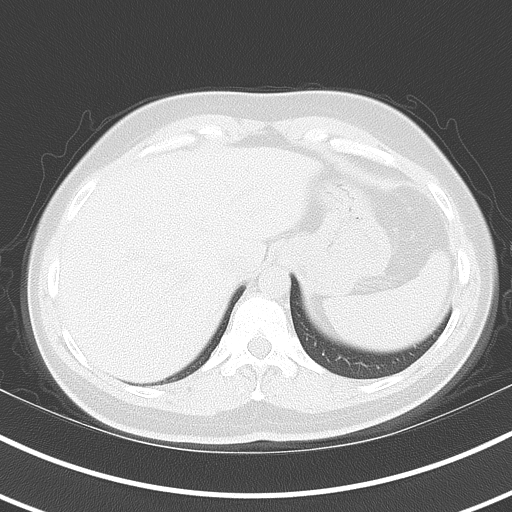
[im 13/26  soft-tissue]
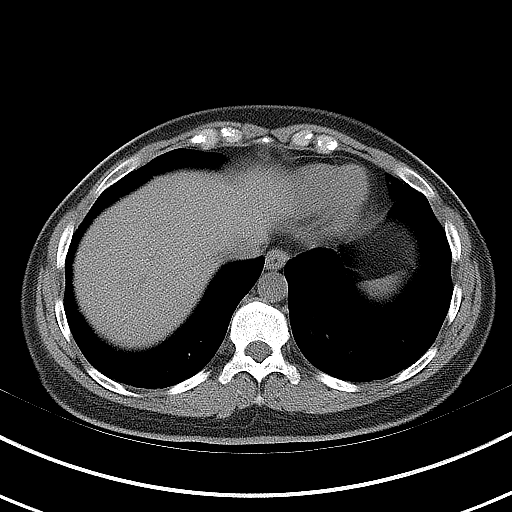
[im 13/26  lung]
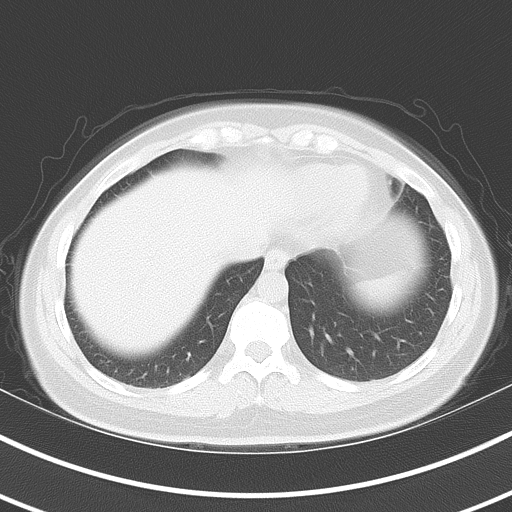
[im 17/26  soft-tissue]
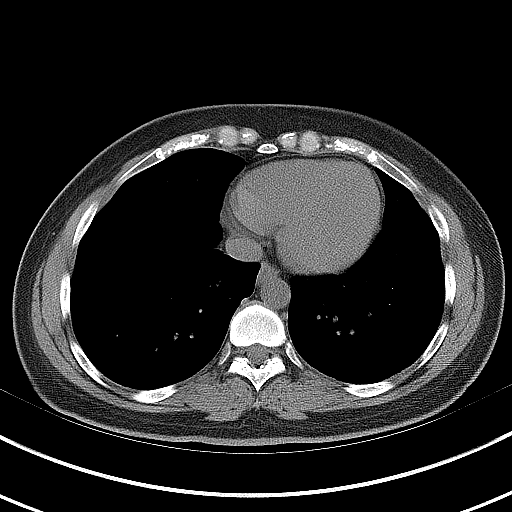
[im 17/26  lung]
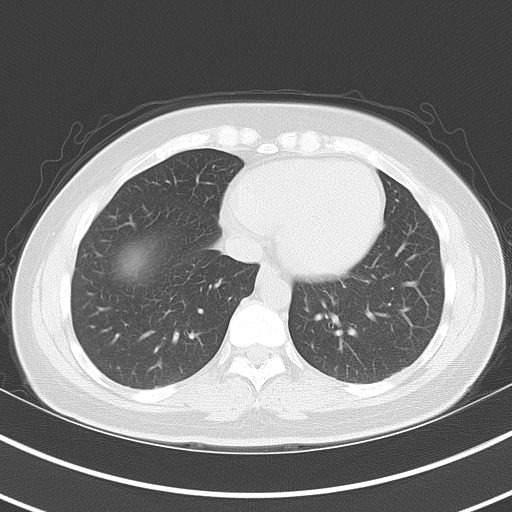
[im 21/26  soft-tissue]
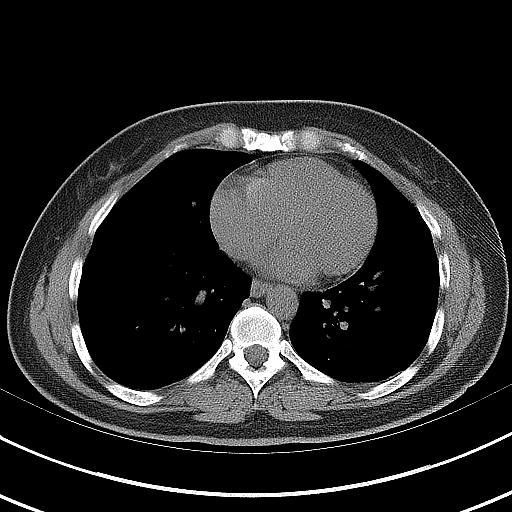
[im 21/26  lung]
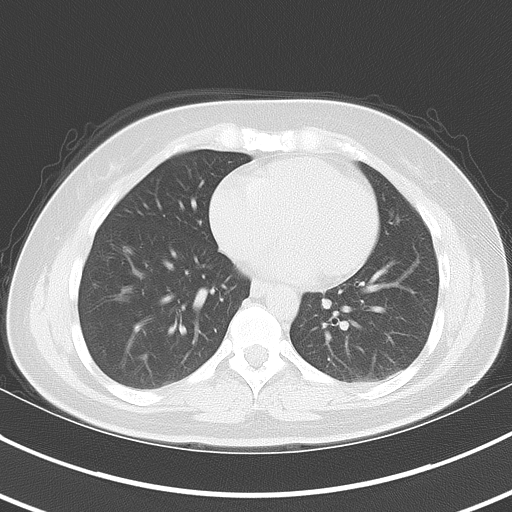

[Series 5: coronal · coronal · 0.66mm/px · 3 of 111 slices shown]
[im 37/111  soft-tissue]
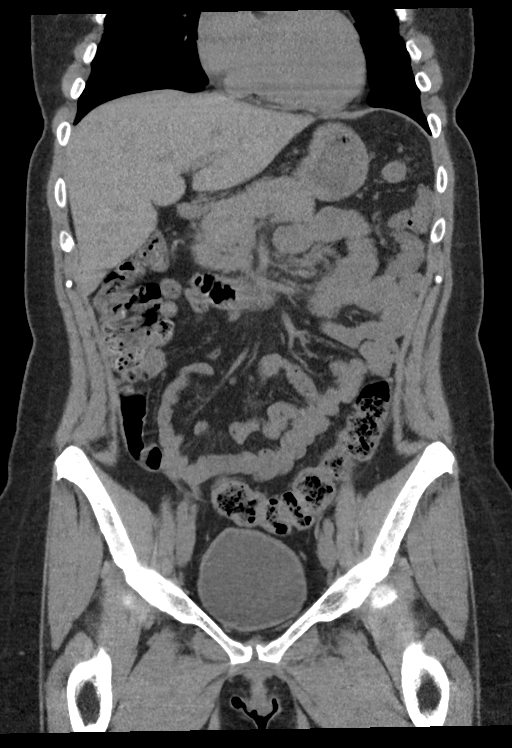
[im 49/111  soft-tissue]
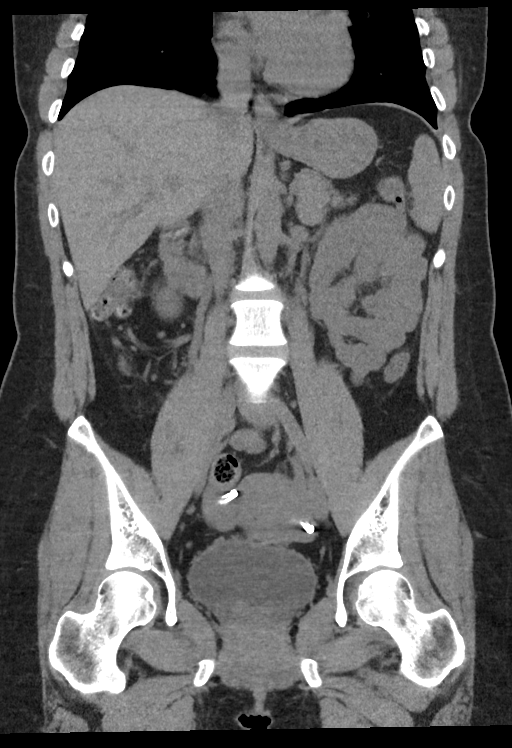
[im 62/111  soft-tissue]
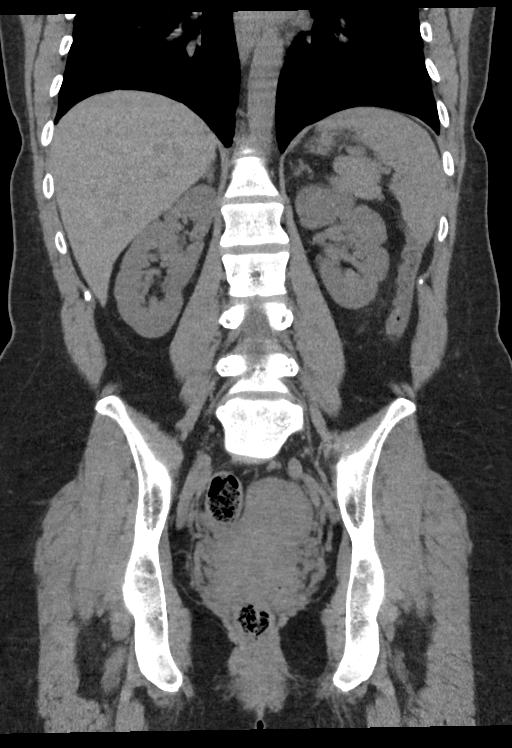

[Series 6: sagittal · sagittal · 0.50mm/px · 1 of 171 slices shown]
[im 57/171  soft-tissue]
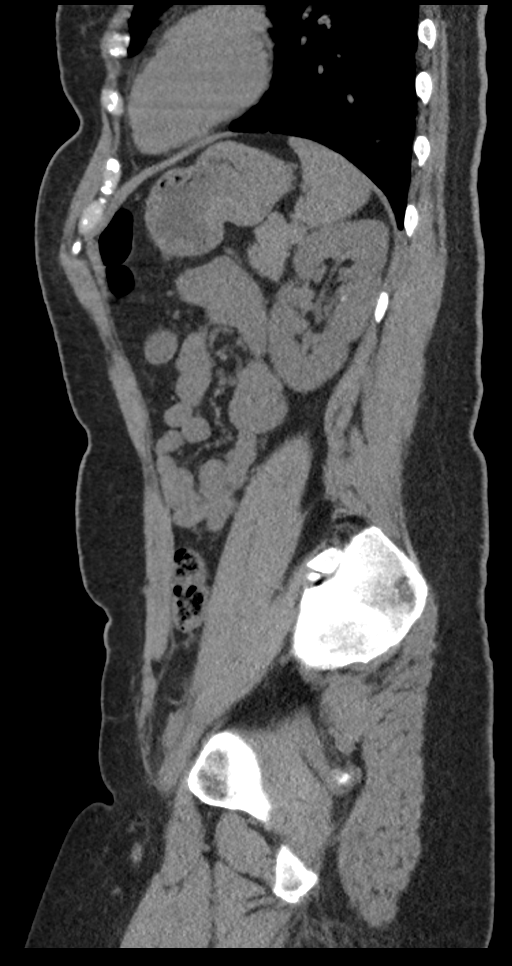

[9 of 46 positions shown; findings below may reference images not displayed]

FINDINGS: Lower chest: There is no edema or consolidation. On axial slice 17
series 4, there is a 2 mm nodular opacity in the lateral segment of
the left lower lobe.

Hepatobiliary: No focal liver lesions are evident on this
noncontrast enhanced study. Gallbladder is absent. There is no
biliary duct dilatation.

Pancreas: No pancreatic mass or inflammatory focus.

Spleen: No splenic lesions are evident.

Adrenals/Urinary Tract: Adrenals bilaterally appear normal. Kidneys
bilaterally show no evident mass or hydronephrosis on either side.
There is a 2 mm calculus in the upper pole of the right kidney.
There is a 2 mm calculus in the lower pole of the left kidney. There
is no evident ureteral calculus on either side. The urinary bladder
is midline with wall thickness within normal limits.

Stomach/Bowel: There is no appreciable bowel wall or mesenteric
thickening. No evident bowel obstruction. No free air or portal
venous air.

Vascular/Lymphatic: No abdominal aortic aneurysm. No vascular
lesions are evident on this noncontrast enhanced study. There is no
evident adenopathy in the abdomen or pelvis.

Reproductive: Uterus is anteverted. Tubal ligation clips are noted
bilaterally. No evident pelvic mass.

Other: There is no periappendiceal region inflammatory change. No
abscess or ascites is evident in the abdomen or pelvis. There is a
minimal ventral hernia containing only fat.

Musculoskeletal: No blastic or lytic bone lesions are evident. No
intramuscular or abdominal wall lesions are evident.
IMPRESSION: 1. There is a 2 mm nonobstructing calculus in each kidney. No
hydronephrosis or ureteral calculus on either side. Urinary bladder
wall thickness normal.

2. No evident bowel obstruction. No abscess in the abdomen or
pelvis. No periappendiceal region inflammatory change.

3.  Gallbladder absent.

4. 2 mm nodular opacity lateral segment left lower lobe. No
follow-up needed if patient is low-risk. Non-contrast chest CT can
be considered in 12 months if patient is high-risk. This
recommendation follows the consensus statement: Guidelines for
Management of Incidental Pulmonary Nodules Detected on CT Images:

5.  Minimal ventral hernia containing only fat.

These results will be called to the ordering clinician or
representative by the [HOSPITAL] at the imaging location.

## 2020-06-05 ENCOUNTER — Encounter: Payer: Self-pay | Admitting: Radiology

## 2020-06-24 ENCOUNTER — Ambulatory Visit
Admission: RE | Admit: 2020-06-24 | Discharge: 2020-06-24 | Disposition: A | Discharge status: Home / Self Care | Payer: BC Managed Care – PPO | Source: Ambulatory Visit | Attending: Family Medicine | Admitting: Family Medicine

## 2020-06-24 ENCOUNTER — Other Ambulatory Visit: Payer: Self-pay

## 2020-06-24 VITALS — BP 146/101 | HR 91 | Temp 98.5°F | Resp 18 | Ht 63.0 in | Wt 172.0 lb

## 2020-06-24 DIAGNOSIS — R059 Cough, unspecified: Secondary | ICD-10-CM | POA: Insufficient documentation

## 2020-06-24 DIAGNOSIS — J101 Influenza due to other identified influenza virus with other respiratory manifestations: Secondary | ICD-10-CM | POA: Diagnosis not present

## 2020-06-24 DIAGNOSIS — R52 Pain, unspecified: Secondary | ICD-10-CM | POA: Insufficient documentation

## 2020-06-24 DIAGNOSIS — Z20822 Contact with and (suspected) exposure to covid-19: Secondary | ICD-10-CM | POA: Insufficient documentation

## 2020-06-24 DIAGNOSIS — R197 Diarrhea, unspecified: Secondary | ICD-10-CM | POA: Diagnosis not present

## 2020-06-24 DIAGNOSIS — R509 Fever, unspecified: Secondary | ICD-10-CM | POA: Insufficient documentation

## 2020-06-24 LAB — RESP PANEL BY RT-PCR (FLU A&B, COVID) ARPGX2
Influenza A by PCR: POSITIVE — AB
Influenza B by PCR: NEGATIVE
SARS Coronavirus 2 by RT PCR: NEGATIVE

## 2020-06-24 MED ORDER — BENZONATATE 200 MG PO CAPS: 200.0000 mg | ORAL_CAPSULE | Freq: Three times a day (TID) | ORAL | 0 refills | Status: DC | PRN

## 2020-06-24 MED ORDER — XOFLUZA (40 MG DOSE) 2 X 20 MG PO TBPK: 40.0000 mg | ORAL_TABLET | Freq: Once | ORAL | 0 refills | Status: AC

## 2020-06-24 NOTE — ED Provider Notes (Signed)
[facility]    CSN: [id number] Arrival date & time: 06/24/20  0933      History   Chief Complaint Chief Complaint  Patient presents with  . Cough   HPI   40 year old female presents with respiratory and GI symptoms.  Patient reports that her symptoms started on Thursday.  She reports cough, fever, diarrhea.  She reports associated body aches.  Her fever has been as high as 102.6.  Currently her pain is generalized and is 7/10 in severity.  She has been taking over-the-counter medication without resolution.  She has other sick individuals at home.  No relieving factors.  She is concerned that she may have influenza.  Home Covid testing was negative.  Past Medical History:  Diagnosis Date  . Abnormal ECG 02/03/2018  . Anxiety and depression   . HSV (herpes simplex virus) infection 02/10/2018  . Hyperlipidemia   . Hypertension     Patient Active Problem List   Diagnosis Date Noted  . Menorrhagia with regular cycle 08/09/2019  . Dysmenorrhea 08/09/2019  . Hyperlipidemia 02/03/2018  . 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 Pain in right knee 09/29/2017  . Overweight 08/12/2017  . Irritable bowel syndrome with diarrhea 07/15/2016  . Vitamin D deficiency 07/15/2016  . Anxiety and depression 02/11/2016  . Essential hypertension 02/08/2013  . Chronic migraine without aura 12/07/2012    Past Surgical History:  Procedure Laterality Date  . CESAREAN SECTION    . CESAREAN SECTION N/A 12/19/2012   Procedure: Repeat cesarean section with delivery of baby girl at [place].    Bilateral tubal ligation with filshie clips.;  Surgeon: Donnamae Jude, MD;  Location: [facility];  Service: Obstetrics;  Laterality: N/A;  . CHOLECYSTECTOMY  2010  . COLONOSCOPY     2009  . COLPOSCOPY VULVA W/ BIOPSY     abnormal pap  . ENDOMETRIAL ABLATION N/A 07/17/2015   Procedure: ENDOMETRIAL ABLATION WITH HTA, DILATATION AND CURRATAGE;  Surgeon: Emily Filbert, MD;  Location: [facility];  Service:  Gynecology;  Laterality: N/A;  . KNEE ARTHROSCOPY    . LAPAROSCOPIC LYSIS OF ADHESIONS  07/17/2015   Procedure: LAPAROSCOPIC LYSIS OF ADHESIONS;  Surgeon: Emily Filbert, MD;  Location: [facility];  Service: Gynecology;;  . LAPAROSCOPY N/A 07/17/2015   Procedure: LAPAROSCOPY DIAGNOSTIC;  Surgeon: Emily Filbert, MD;  Location: [facility];  Service: Gynecology;  Laterality: N/A;  . TUBAL LIGATION    . UPPER GI ENDOSCOPY    . WISDOM TOOTH EXTRACTION      OB History    Gravida  2   Para  2   Term  1   Preterm  1   AB      Living  2     SAB      IAB      Ectopic      Multiple      Live Births  2            Home Medications    Prior to Admission medications   Medication Sig Start Date End Date Taking? Authorizing Provider  amLODipine (NORVASC) 10 MG tablet Take 1 tablet (10 mg total) by mouth daily. 08/12/17  Yes Worley, Aldona Bar, PA  Baloxavir Marboxil,40 MG Dose, (XOFLUZA, 40 MG DOSE,) 2 x 20 MG TBPK Take 40 mg by mouth once for 1 dose. 06/24/20 06/24/20 Yes Dashun Borre G, DO  benzonatate (TESSALON) 200 MG capsule Take 1 capsule (200 mg total) by mouth 3 (three)  times daily as needed for cough. 06/24/20  Yes Jaiden Dinkins G, DO  fluticasone (FLONASE) 50 MCG/ACT nasal spray Place 2 sprays into both nostrils daily. 03/27/20  Yes Muthersbaugh, Jarrett Soho, PA-C  lisinopril-hydrochlorothiazide (ZESTORETIC) 20-25 MG tablet Take 1 tablet by mouth daily. 03/04/19  Yes Inda Coke, PA  valACYclovir (VALTREX) 500 MG tablet Take 1 tablet (500 mg total) by mouth daily. Can increase to twice a day for 5 days in the event of a recurrence 08/23/19  Yes Inda Coke, PA  albuterol (VENTOLIN HFA) 108 (90 Base) MCG/ACT inhaler Inhale 2 puffs into the lungs every 4 (four) hours. 01/03/20   [provider]  famotidine (PEPCID) 20 MG tablet Take 1 tablet (20 mg total) by mouth 2 (two) times daily. 03/27/20   Muthersbaugh, Jarrett Soho, PA-C  hydrocortisone 2.5 % ointment Apply topically 2 (two) times  daily. 04/26/20   Moye, [place], MD  buPROPion (WELLBUTRIN XL) 150 MG 24 hr tablet Take 150 mg ER PO qam. Patient not taking: Reported on 05/09/2019 11/01/18 01/04/20  Inda Coke, PA    Family History Family History  Problem Relation Age of Onset  . Hypertension Mother   . Anuerysm Mother        brain  . Migraines Mother   . Heart disease Father   . Hypertension Father   . Hyperlipidemia Father   . Prostate cancer Father   . Depression Brother   . Multiple sclerosis Maternal Grandmother   . Sudden death Maternal Grandmother   . Heart disease Maternal Grandfather   . Sudden death Maternal Grandfather   . Heart disease Paternal Grandmother   . Diabetes Paternal Grandmother   . Parkinson's disease Paternal Grandfather     Social History Social History   Tobacco Use  . Smoking status: Never Smoker  . Smokeless tobacco: Never Used  Vaping Use  . Vaping Use: Never used  Substance Use Topics  . Alcohol use: Yes    Alcohol/week: 0.0 standard drinks    Comment: rarely  . Drug use: No     Allergies   Latex, Metoclopramide, and Sertraline   Review of Systems Review of Systems  Constitutional: Positive for fever.  HENT: Positive for rhinorrhea.   Respiratory: Positive for cough.   Gastrointestinal: Positive for diarrhea.  Musculoskeletal:       Body aches.   Physical Exam Triage Vital Signs ED Triage Vitals  Enc Vitals Group     BP 06/24/20 0950 (!) 146/101     Pulse Rate 06/24/20 0950 91     Resp 06/24/20 0950 18     Temp 06/24/20 0950 98.5 F (36.9 C)     Temp Source 06/24/20 0950 Oral     SpO2 06/24/20 0950 100 %     Weight 06/24/20 0947 172 lb (78 kg)     Height 06/24/20 0947 5\' 3"  (1.6 m)     Head Circumference --      Peak Flow --      Pain Score 06/24/20 0947 7     Pain Loc --      Pain Edu? --      Excl. in [place]? --    Updated Vital Signs BP (!) 146/101 (BP Location: Left Arm)   Pulse 91   Temp 98.5 F (36.9 C) (Oral)   Resp 18   Ht 5\' 3"   (1.6 m)   Wt 78 kg   LMP 06/20/2020 (Approximate)   SpO2 100%   BMI 30.47 kg/m   Visual Acuity  Right Eye Distance:   Left Eye Distance:   Bilateral Distance:    Right Eye Near:   Left Eye Near:    Bilateral Near:     Physical Exam Vitals and nursing note reviewed.  Constitutional:      General: She is not in acute distress. HENT:     Head: Normocephalic and atraumatic.     Right Ear: Tympanic membrane normal.     Left Ear: Tympanic membrane normal.     Mouth/Throat:     Pharynx: Oropharynx is clear.  Eyes:     General:        Right eye: No discharge.        Left eye: No discharge.     Conjunctiva/sclera: Conjunctivae normal.  Cardiovascular:     Rate and Rhythm: Normal rate and regular rhythm.  Pulmonary:     Effort: Pulmonary effort is normal.     Breath sounds: Normal breath sounds. No wheezing, rhonchi or rales.  Neurological:     Mental Status: She is alert.  Psychiatric:        Mood and Affect: Mood normal.        Behavior: Behavior normal.    UC Treatments / Results  Labs (all labs ordered are listed, but only abnormal results are displayed) Labs Reviewed  RESP PANEL BY RT-PCR (FLU A&B, COVID) ARPGX2 - Abnormal; Notable for the following components:      Result Value   Influenza A by PCR POSITIVE (*)    All other components within normal limits    EKG   Radiology No results found.  Procedures Procedures (including critical care time)  Medications Ordered in UC Medications - No data to display  Initial Impression / Assessment and Plan / UC Course  I have reviewed the triage vital signs and the nursing notes.  Pertinent labs & imaging results that were available during my care of the patient were reviewed by me and considered in my medical decision making (see chart for details).    40 year old female presents with influenza.  Acute illness with systemic symptoms. Treating with Xofluza.  Tessalon Perles for cough.  Final Clinical  Impressions(s) / UC Diagnoses   Final diagnoses:  Influenza A   Discharge Instructions   None    ED Prescriptions    Medication Sig Dispense Auth. Provider   Baloxavir Marboxil,40 MG Dose, (XOFLUZA, 40 MG DOSE,) 2 x 20 MG TBPK Take 40 mg by mouth once for 1 dose. 2 each Coral Spikes, DO   benzonatate (TESSALON) 200 MG capsule Take 1 capsule (200 mg total) by mouth 3 (three) times daily as needed for cough. 30 capsule Coral Spikes, DO     PDMP not reviewed this encounter.   Coral Spikes, [place] 06/24/20 1149

## 2020-06-24 NOTE — ED Triage Notes (Signed)
Pt c/o cough since Thursday that has not improved. Pt reports fever up to 102.6. Pt also states she is having abd pain and diarrhea, her family has also had these symptoms. Pt would like to be tested for flu. Pt denies emesis but has had some nausea. Pt has taken at-home COVID test and was negative.

## 2020-07-05 DIAGNOSIS — M25561 Pain in right knee: Secondary | ICD-10-CM | POA: Diagnosis not present

## 2020-07-10 ENCOUNTER — Ambulatory Visit: Payer: BC Managed Care – PPO | Admitting: Dermatology

## 2020-07-11 ENCOUNTER — Encounter: Payer: Self-pay | Admitting: Physician Assistant

## 2020-07-11 ENCOUNTER — Other Ambulatory Visit: Payer: Self-pay

## 2020-07-11 ENCOUNTER — Ambulatory Visit: Payer: BC Managed Care – PPO | Admitting: Physician Assistant

## 2020-07-11 ENCOUNTER — Ambulatory Visit (INDEPENDENT_AMBULATORY_CARE_PROVIDER_SITE_OTHER): Payer: BC Managed Care – PPO

## 2020-07-11 VITALS — BP 112/80 | HR 90 | Temp 98.3°F | Ht 63.0 in | Wt 179.0 lb

## 2020-07-11 DIAGNOSIS — R079 Chest pain, unspecified: Secondary | ICD-10-CM

## 2020-07-11 DIAGNOSIS — R0602 Shortness of breath: Secondary | ICD-10-CM | POA: Diagnosis not present

## 2020-07-11 DIAGNOSIS — I10 Essential (primary) hypertension: Secondary | ICD-10-CM | POA: Diagnosis not present

## 2020-07-11 DIAGNOSIS — R635 Abnormal weight gain: Secondary | ICD-10-CM

## 2020-07-11 LAB — CBC WITH DIFFERENTIAL/PLATELET
Basophils Absolute: 0 10*3/uL (ref 0.0–0.1)
Basophils Relative: 0.5 % (ref 0.0–3.0)
Eosinophils Absolute: 0.1 10*3/uL (ref 0.0–0.7)
Eosinophils Relative: 1.2 % (ref 0.0–5.0)
HCT: 43.7 % (ref 36.0–46.0)
Hemoglobin: 14.5 g/dL (ref 12.0–15.0)
Lymphocytes Relative: 32.2 % (ref 12.0–46.0)
Lymphs Abs: 2 10*3/uL (ref 0.7–4.0)
MCHC: 33.1 g/dL (ref 30.0–36.0)
MCV: 83.2 fl (ref 78.0–100.0)
Monocytes Absolute: 0.7 10*3/uL (ref 0.1–1.0)
Monocytes Relative: 11.6 % (ref 3.0–12.0)
Neutro Abs: 3.4 10*3/uL (ref 1.4–7.7)
Neutrophils Relative %: 54.5 % (ref 43.0–77.0)
Platelets: 315 10*3/uL (ref 150.0–400.0)
RBC: 5.25 Mil/uL — ABNORMAL HIGH (ref 3.87–5.11)
RDW: 14 % (ref 11.5–15.5)
WBC: 6.3 10*3/uL (ref 4.0–10.5)

## 2020-07-11 LAB — COMPREHENSIVE METABOLIC PANEL
ALT: 10 U/L (ref 0–35)
AST: 13 U/L (ref 0–37)
Albumin: 4.2 g/dL (ref 3.5–5.2)
Alkaline Phosphatase: 71 U/L (ref 39–117)
BUN: 17 mg/dL (ref 6–23)
CO2: 28 mEq/L (ref 19–32)
Calcium: 9.4 mg/dL (ref 8.4–10.5)
Chloride: 101 mEq/L (ref 96–112)
Creatinine, Ser: 0.77 mg/dL (ref 0.40–1.20)
GFR: 97.95 mL/min (ref >60.00)
Glucose, Bld: 78 mg/dL (ref 70–99)
Potassium: 3.8 mEq/L (ref 3.5–5.1)
Sodium: 138 mEq/L (ref 135–145)
Total Bilirubin: 0.7 mg/dL (ref 0.2–1.2)
Total Protein: 7.4 g/dL (ref 6.0–8.3)

## 2020-07-11 LAB — TSH: TSH: 1.08 u[IU]/mL (ref 0.35–4.50)

## 2020-07-11 MED ORDER — LISINOPRIL-HYDROCHLOROTHIAZIDE 20-25 MG PO TABS: 1.0000 | ORAL_TABLET | Freq: Every day | ORAL | 3 refills | Status: DC

## 2020-07-11 MED ORDER — AMLODIPINE BESYLATE 10 MG PO TABS: 10.0000 mg | ORAL_TABLET | Freq: Every day | ORAL | 3 refills | Status: DC

## 2020-07-11 NOTE — Patient Instructions (Signed)
It was great to see you!  EKG and chest xray today. Update blood work today Referral to [facility] cardiology.  Saxenda sample given today. Don't start until we figure out your blood pressure and heart issues.  Take care,  Inda Coke PA-C

## 2020-07-11 NOTE — Progress Notes (Signed)
Alisha Hughes is a 40 y.o. female is here to discuss:  I acted as a Education administrator for [employer], PA-C Anselmo Pickler, LPN   History of Present Illness:   Chief Complaint  Patient presents with  . Hypertension  . weight loss    HPI    Hypertension Pt c/o blood pressure elevated for the past week, headache. Bp running 140-160's, 95-102. This morning Bp 120/95. Currently taking Amlodipine 10 mg daily and Lisinopril- HCTZ 20-25 mg daily. Pt is c/o elevated heart rate, chest pain and SOB. Pt had influenza 2 weeks ago and still feels not herself. Pt denies, dizziness, blurred vision,  or lower leg edema. Denies excessive caffeine intake, stimulant usage, excessive alcohol intake or increase in salt consumption.  Has been out of amlodopine for awhile -- states that she cannot remember when she last took this.  Has been trying to eat better, denies changes in caffeine intake or stress level. She has been going to kickboxing class -- this does not cause any worsening chest pain or SOB for her.  Did see cardiology in 2019 -- [facility]. Records in chart.   Wt Readings from Last 4 Encounters:  07/11/20 179 lb (81.2 kg)  06/24/20 172 lb (78 kg)  01/04/20 170 lb (77.1 kg)  08/09/19 171 lb (77.6 kg)    Weight loss Pt would ike to discuss weight loss medication. Has not tried any medications in the past. Having knee pain that is limiting her activity.      Health Maintenance Due  Topic Date Due  . Hepatitis C Screening  Never done    Past Medical History:  Diagnosis Date  . Abnormal ECG 02/03/2018  . Anxiety and depression   . HSV (herpes simplex virus) infection 02/10/2018  . Hyperlipidemia   . Hypertension      Social History   Tobacco Use  . Smoking status: Never Smoker  . Smokeless tobacco: Never Used  Vaping Use  . Vaping Use: Never used  Substance Use Topics  . Alcohol use: Yes    Alcohol/week: 0.0 standard drinks    Comment: rarely  . Drug use: No     Past Surgical History:  Procedure Laterality Date  . CESAREAN SECTION    . CESAREAN SECTION N/A 12/19/2012   Procedure: Repeat cesarean section with delivery of baby girl at [place].    Bilateral tubal ligation with filshie clips.;  Surgeon: Donnamae Jude, MD;  Location: [facility];  Service: Obstetrics;  Laterality: N/A;  . CHOLECYSTECTOMY  2010  . COLONOSCOPY     2009  . COLPOSCOPY VULVA W/ BIOPSY     abnormal pap  . ENDOMETRIAL ABLATION N/A 07/17/2015   Procedure: ENDOMETRIAL ABLATION WITH HTA, DILATATION AND CURRATAGE;  Surgeon: Emily Filbert, MD;  Location: [facility];  Service: Gynecology;  Laterality: N/A;  . KNEE ARTHROSCOPY    . LAPAROSCOPIC LYSIS OF ADHESIONS  07/17/2015   Procedure: LAPAROSCOPIC LYSIS OF ADHESIONS;  Surgeon: Emily Filbert, MD;  Location: [facility];  Service: Gynecology;;  . LAPAROSCOPY N/A 07/17/2015   Procedure: LAPAROSCOPY DIAGNOSTIC;  Surgeon: Emily Filbert, MD;  Location: [facility];  Service: Gynecology;  Laterality: N/A;  . TUBAL LIGATION    . UPPER GI ENDOSCOPY    . WISDOM TOOTH EXTRACTION      Family History  Problem Relation Age of Onset  . Hypertension Mother   . Anuerysm Mother        brain  . Migraines Mother   .  Heart disease Father   . Hypertension Father   . Hyperlipidemia Father   . Prostate cancer Father   . Depression Brother   . Multiple sclerosis Maternal Grandmother   . Sudden death Maternal Grandmother   . Heart disease Maternal Grandfather   . Sudden death Maternal Grandfather   . Heart disease Paternal Grandmother   . Diabetes Paternal Grandmother   . Parkinson's disease Paternal Grandfather     PMHx, SurgHx, SocialHx, FamHx, Medications, and Allergies were reviewed in the Visit Navigator and updated as appropriate.   Patient Active Problem List   Diagnosis Date Noted  . Menorrhagia with regular cycle 08/09/2019  . Dysmenorrhea 08/09/2019  . Hyperlipidemia 02/03/2018  . 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 Pain in right knee  09/29/2017  . Overweight 08/12/2017  . Irritable bowel syndrome with diarrhea 07/15/2016  . Vitamin D deficiency 07/15/2016  . Anxiety and depression 02/11/2016  . Essential hypertension 02/08/2013  . Chronic migraine without aura 12/07/2012    Social History   Tobacco Use  . Smoking status: Never Smoker  . Smokeless tobacco: Never Used  Vaping Use  . Vaping Use: Never used  Substance Use Topics  . Alcohol use: Yes    Alcohol/week: 0.0 standard drinks    Comment: rarely  . Drug use: No    Current Medications and Allergies:    Current Outpatient Medications:  .  fluticasone (FLONASE) 50 MCG/ACT nasal spray, Place 2 sprays into both nostrils daily., Disp: 9.9 g, Rfl: 0 .  valACYclovir (VALTREX) 500 MG tablet, Take 1 tablet (500 mg total) by mouth daily. Can increase to twice a day for 5 days in the event of a recurrence, Disp: 30 tablet, Rfl: 5 .  amLODipine (NORVASC) 10 MG tablet, Take 1 tablet (10 mg total) by mouth daily., Disp: 90 tablet, Rfl: 3 .  lisinopril-hydrochlorothiazide (ZESTORETIC) 20-25 MG tablet, Take 1 tablet by mouth daily., Disp: 90 tablet, Rfl: 3   Allergies  Allergen Reactions  . Latex Rash  . Metoclopramide Nausea Only and Nausea And Vomiting    Other reaction(s): Other (See Comments) Dizziness Dizziness Dizziness Dizziness  . Sertraline Rash    Review of Systems   ROS Negative unless otherwise specified per HPI.  Vitals:   Vitals:   07/11/20 0729  BP: 112/80  Pulse: 90  Temp: 98.3 F (36.8 C)  TempSrc: Temporal  SpO2: 98%  Weight: 179 lb (81.2 kg)  Height: 5\' 3"  (1.6 m)     Body mass index is 31.71 kg/m.   Physical Exam:    Physical Exam Vitals and nursing note reviewed.  Constitutional:      General: She is not in acute distress.    Appearance: She is well-developed. She is not ill-appearing or toxic-appearing.  Cardiovascular:     Rate and Rhythm: Normal rate and regular rhythm.     Pulses: Normal pulses.     Heart  sounds: Normal heart sounds, S1 normal and S2 normal.     Comments: No LE edema Pulmonary:     Effort: Pulmonary effort is normal.     Breath sounds: Normal breath sounds.  Skin:    General: Skin is warm and dry.  Neurological:     Mental Status: She is alert.     GCS: GCS eye subscore is 4. GCS verbal subscore is 5. GCS motor subscore is 6.  Psychiatric:        Speech: Speech normal.        Behavior:  Behavior normal. Behavior is cooperative.      Assessment and Plan:    [place] was seen today for hypertension and weight loss.  Diagnoses and all orders for this visit:  Chest pain, unspecified type; Essential hypertension EKG tracing is personally reviewed.  EKG notes NSR.  Slight ST elevation in V1. Denies active symptoms. Update CXR and blood work. Urgent referral back to cardiology for evaluation. Recommend no aggressive activity until seen by cards. Worsening precautions advised. Restart norvasc 10 mg daily and continue lisinopril-hydrochlorothiazide (ZESTORETIC) 20-25 MG tablet. -     DG Chest 2 View; Future -     EKG 12-Lead -     CBC with Differential/Platelet -     Comprehensive metabolic panel -     TSH -     Ambulatory referral to Cardiology  Weight gain Saxenda sample provided. Recommend starting this after cardiology eval and symptoms have improved.  Other orders -     lisinopril-hydrochlorothiazide (ZESTORETIC) 20-25 MG tablet; Take 1 tablet by mouth daily. -     amLODipine (NORVASC) 10 MG tablet; Take 1 tablet (10 mg total) by mouth daily.  CMA or LPN served as scribe during this visit. History, Physical, and Plan performed by medical provider. The above documentation has been reviewed and is accurate and complete.  Inda Coke, PA-C [place], [place] 07/11/2020  Follow-up: No follow-ups on file.

## 2020-07-12 DIAGNOSIS — R0602 Shortness of breath: Secondary | ICD-10-CM | POA: Diagnosis not present

## 2020-07-12 DIAGNOSIS — R079 Chest pain, unspecified: Secondary | ICD-10-CM | POA: Diagnosis not present

## 2020-07-12 DIAGNOSIS — I10 Essential (primary) hypertension: Secondary | ICD-10-CM | POA: Diagnosis not present

## 2020-07-12 DIAGNOSIS — R000 Tachycardia, unspecified: Secondary | ICD-10-CM | POA: Diagnosis not present

## 2020-07-13 ENCOUNTER — Ambulatory Visit: Payer: BC Managed Care – PPO | Admitting: Physician Assistant

## 2020-07-18 DIAGNOSIS — R079 Chest pain, unspecified: Secondary | ICD-10-CM | POA: Diagnosis not present

## 2020-07-25 ENCOUNTER — Encounter: Payer: Self-pay | Admitting: Physician Assistant

## 2020-07-25 ENCOUNTER — Other Ambulatory Visit: Payer: Self-pay | Admitting: Physician Assistant

## 2020-07-25 MED ORDER — CEPHALEXIN 500 MG PO CAPS: 500.0000 mg | ORAL_CAPSULE | Freq: Two times a day (BID) | ORAL | 0 refills | Status: DC

## 2020-11-05 ENCOUNTER — Encounter: Payer: Self-pay | Admitting: Physician Assistant

## 2020-11-05 NOTE — Telephone Encounter (Signed)
Dr. Jerline Pain, please see message and advise. Pt has hx of UTI's.

## 2020-11-06 MED ORDER — NITROFURANTOIN MONOHYD MACRO 100 MG PO CAPS: 100.0000 mg | ORAL_CAPSULE | Freq: Two times a day (BID) | ORAL | 0 refills | Status: DC

## 2020-11-08 ENCOUNTER — Emergency Department: Payer: BC Managed Care – PPO

## 2020-11-08 ENCOUNTER — Emergency Department
Admission: EM | Admit: 2020-11-08 | Discharge: 2020-11-09 | Disposition: A | Discharge status: Home / Self Care | Payer: BC Managed Care – PPO | Attending: Emergency Medicine | Admitting: Emergency Medicine

## 2020-11-08 ENCOUNTER — Ambulatory Visit (HOSPITAL_COMMUNITY): Admit: 2020-11-08 | Discharge status: Against Medical Advice | Payer: BC Managed Care – PPO

## 2020-11-08 DIAGNOSIS — I10 Essential (primary) hypertension: Secondary | ICD-10-CM | POA: Diagnosis not present

## 2020-11-08 DIAGNOSIS — Z91040 Latex allergy status: Secondary | ICD-10-CM | POA: Diagnosis not present

## 2020-11-08 DIAGNOSIS — R350 Frequency of micturition: Secondary | ICD-10-CM | POA: Diagnosis not present

## 2020-11-08 DIAGNOSIS — Z9851 Tubal ligation status: Secondary | ICD-10-CM | POA: Diagnosis not present

## 2020-11-08 DIAGNOSIS — N200 Calculus of kidney: Secondary | ICD-10-CM | POA: Diagnosis not present

## 2020-11-08 DIAGNOSIS — R300 Dysuria: Secondary | ICD-10-CM | POA: Insufficient documentation

## 2020-11-08 DIAGNOSIS — Z79899 Other long term (current) drug therapy: Secondary | ICD-10-CM | POA: Insufficient documentation

## 2020-11-08 DIAGNOSIS — N3289 Other specified disorders of bladder: Secondary | ICD-10-CM | POA: Diagnosis not present

## 2020-11-08 DIAGNOSIS — R109 Unspecified abdominal pain: Secondary | ICD-10-CM | POA: Diagnosis not present

## 2020-11-08 DIAGNOSIS — K429 Umbilical hernia without obstruction or gangrene: Secondary | ICD-10-CM | POA: Diagnosis not present

## 2020-11-08 LAB — COMPREHENSIVE METABOLIC PANEL
ALT: 13 U/L (ref 0–44)
AST: 15 U/L (ref 15–41)
Albumin: 4 g/dL (ref 3.5–5.0)
Alkaline Phosphatase: 65 U/L (ref 38–126)
Anion gap: 7 (ref 5–15)
BUN: 11 mg/dL (ref 6–20)
CO2: 26 mmol/L (ref 22–32)
Calcium: 9.3 mg/dL (ref 8.9–10.3)
Chloride: 106 mmol/L (ref 98–111)
Creatinine, Ser: 0.75 mg/dL (ref 0.44–1.00)
GFR, Estimated: >60 mL/min (ref >60)
Glucose, Bld: 90 mg/dL (ref 70–99)
Potassium: 3.7 mmol/L (ref 3.5–5.1)
Sodium: 139 mmol/L (ref 135–145)
Total Bilirubin: 0.6 mg/dL (ref 0.3–1.2)
Total Protein: 7.2 g/dL (ref 6.5–8.1)

## 2020-11-08 LAB — URINALYSIS, COMPLETE (UACMP) WITH MICROSCOPIC
Bacteria, UA: NONE SEEN
Bilirubin Urine: NEGATIVE
Glucose, UA: NEGATIVE mg/dL
Ketones, ur: NEGATIVE mg/dL
Leukocytes,Ua: NEGATIVE
Nitrite: NEGATIVE
Protein, ur: NEGATIVE mg/dL
Specific Gravity, Urine: 1.004 — ABNORMAL LOW (ref 1.005–1.030)
pH: 7 (ref 5.0–8.0)

## 2020-11-08 LAB — POC URINE PREG, ED: Preg Test, Ur: NEGATIVE

## 2020-11-08 LAB — CBC WITH DIFFERENTIAL/PLATELET
Abs Immature Granulocytes: 0.01 10*3/uL (ref 0.00–0.07)
Basophils Absolute: 0 10*3/uL (ref 0.0–0.1)
Basophils Relative: 1 %
Eosinophils Absolute: 0.1 10*3/uL (ref 0.0–0.5)
Eosinophils Relative: 1 %
HCT: 41.6 % (ref 36.0–46.0)
Hemoglobin: 13.6 g/dL (ref 12.0–15.0)
Immature Granulocytes: 0 %
Lymphocytes Relative: 42 %
Lymphs Abs: 2.7 10*3/uL (ref 0.7–4.0)
MCH: 28.6 pg (ref 26.0–34.0)
MCHC: 32.7 g/dL (ref 30.0–36.0)
MCV: 87.6 fL (ref 80.0–100.0)
Monocytes Absolute: 0.7 10*3/uL (ref 0.1–1.0)
Monocytes Relative: 11 %
Neutro Abs: 2.9 10*3/uL (ref 1.7–7.7)
Neutrophils Relative %: 45 %
Platelets: 267 10*3/uL (ref 150–400)
RBC: 4.75 MIL/uL (ref 3.87–5.11)
RDW: 13.3 % (ref 11.5–15.5)
WBC: 6.4 10*3/uL (ref 4.0–10.5)
nRBC: 0 % (ref 0.0–0.2)

## 2020-11-08 MED ORDER — KETOROLAC TROMETHAMINE 30 MG/ML IJ SOLN
15.0000 mg | Freq: Once | INTRAMUSCULAR | Status: AC
  Administered 2020-11-08: 15 mg via INTRAVENOUS
  Filled 2020-11-08: qty 1

## 2020-11-08 MED ORDER — AMLODIPINE BESYLATE 5 MG PO TABS
10.0000 mg | ORAL_TABLET | Freq: Once | ORAL | Status: AC
  Administered 2020-11-09: 10 mg via ORAL
  Filled 2020-11-08: qty 2

## 2020-11-08 NOTE — ED Triage Notes (Signed)
Pt presents to the ED with c/o right sided flank pain that began tonight. Pt reports several episodes of kidneys stones in the past and symptoms are the same.

## 2020-11-08 NOTE — ED Provider Notes (Signed)
[facility] Emergency Department Provider Note   ____________________________________________   Event Date/Time   First MD Initiated Contact with Patient 11/08/20 2216     (approximate)  I have reviewed the triage vital signs and the nursing notes.   HISTORY  Chief Complaint Flank Pain    HPI Alisha Hughes is a 40 y.o. female with past medical history of hypertension, hyperlipidemia, migraines, and recurrent UTI who presents to the ED complaining of flank pain.  Patient reports that on 'Sunday she developed dysuria, urinary frequency, and suprapubic pressure.  She has been told by her PCP in the past to take Macrobid after any sexual activity, states she did so and spoke with her PCP, who prescribed her an additional course of Macrobid.  She states that her symptoms have not improved since then and she continues to have dysuria, has now developed pain in her right flank.  She describes his pain as constant and sharp, not exacerbated or alleviated by anything.  She has not had any fevers and denies nausea, vomiting, or diarrhea.  She describes current symptoms as similar to prior urinary tract infections as well as prior kidney stones.        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    Bilateral tubal ligation with filshie clips.;  Surgeon: Tanya S Pratt, MD;  Location: [facility];  Service: Obstetrics;  Laterality: N/A;   CHOLECYSTECTOMY  2010   COLONOSCOPY     20'$ 09   COLPOSCOPY VULVA W/ BIOPSY     abnormal pap   ENDOMETRIAL ABLATION N/A 07/17/2015   Procedure: ENDOMETRIAL ABLATION WITH HTA, [place];  Surgeon: Emily Filbert, MD;  Location: [facility];  Service: Gynecology;  Laterality: N/A;   KNEE ARTHROSCOPY     LAPAROSCOPIC LYSIS OF ADHESIONS  07/17/2015   Procedure: LAPAROSCOPIC LYSIS OF ADHESIONS;  Surgeon: Emily Filbert, MD;  Location: [facility];  Service: Gynecology;;   LAPAROSCOPY N/A 07/17/2015   Procedure: LAPAROSCOPY DIAGNOSTIC;  Surgeon: Emily Filbert, MD;  Location: [facility];  Service: Gynecology;  Laterality: N/A;   TUBAL LIGATION     UPPER GI ENDOSCOPY     WISDOM TOOTH EXTRACTION      Prior to Admission medications   Medication Sig Start Date End Date Taking? Authorizing Provider  amLODipine (NORVASC) 10 MG tablet Take 1 tablet (10 mg total) by mouth daily. 07/11/20   Inda Coke, PA  cephALEXin (KEFLEX) 500 MG capsule Take 1 capsule (500 mg total) by mouth 2 (two) times daily. 07/25/20   Inda Coke, PA  fluticasone (FLONASE) 50 MCG/ACT nasal spray Place 2 sprays into both nostrils daily. 03/27/20   Muthersbaugh, Jarrett Soho,  PA-C  lisinopril-hydrochlorothiazide (ZESTORETIC) 20-25 MG tablet Take 1 tablet by mouth daily. 07/11/20   Inda Coke, PA  nitrofurantoin, macrocrystal-monohydrate, (MACROBID) 100 MG capsule Take 1 capsule (100 mg total) by mouth 2 (two) times daily. 11/06/20   Vivi Barrack, MD  valACYclovir (VALTREX) 500 MG tablet Take 1 tablet (500 mg total) by mouth daily. Can increase to twice a day for 5 days in the event of a recurrence 08/23/19   Inda Coke, PA  buPROPion (WELLBUTRIN XL) 150 MG 24 hr tablet Take 150 mg ER PO  qam. Patient not taking: Reported on 05/09/2019 11/01/18 01/04/20  Inda Coke, PA    Allergies Latex, Metoclopramide, and Sertraline  Family History  Problem Relation Age of Onset   Hypertension Mother    Anuerysm Mother        brain   Migraines Mother    Heart disease Father    Hypertension Father    Hyperlipidemia Father    Prostate cancer Father    Depression Brother    Multiple sclerosis Maternal Grandmother    Sudden death Maternal Grandmother    Heart disease Maternal Grandfather    Sudden death Maternal Grandfather    Heart disease Paternal Grandmother    Diabetes Paternal Grandmother    Parkinson's disease Paternal Grandfather     Social History Social History   Tobacco Use   Smoking status: Never   Smokeless tobacco: Never  Vaping Use   Vaping Use: Never used  Substance Use Topics   Alcohol use: Yes    Alcohol/week: 0.0 standard drinks    Comment: rarely   Drug use: No    Review of Systems  Constitutional: No fever/chills Eyes: No visual changes. ENT: No sore throat. Cardiovascular: Denies chest pain. Respiratory: Denies shortness of breath. Gastrointestinal: No abdominal pain.  Positive for flank pain.  No nausea, no vomiting.  No diarrhea.  No constipation. Genitourinary: Positive for dysuria and frequency. Musculoskeletal: Negative for back pain. Skin: Negative for rash. Neurological: Negative for headaches, focal weakness or numbness.  ____________________________________________   PHYSICAL EXAM:  VITAL SIGNS: ED Triage Vitals  Enc Vitals Group     BP 11/08/20 1804 (!) 172/124     Pulse Rate 11/08/20 1804 87     Resp 11/08/20 1804 16     Temp 11/08/20 1804 98 F (36.7 C)     Temp Source 11/08/20 1804 Oral     SpO2 11/08/20 1804 99 %     Weight 11/08/20 1805 178 lb 9.2 oz (81 kg)     Height 11/08/20 1805 '5\' 3"'$  (1.6 m)     Head Circumference --      Peak Flow --      Pain Score 11/08/20 1805 8     Pain Loc --      Pain Edu? --       Excl. in [place]? --     Constitutional: Alert and oriented. Eyes: Conjunctivae are normal. Head: Atraumatic. Nose: No congestion/rhinnorhea. Mouth/Throat: Mucous membranes are moist. Neck: Normal ROM Cardiovascular: Normal rate, regular rhythm. Grossly normal heart sounds. Respiratory: Normal respiratory effort.  No retractions. Lungs CTAB. Gastrointestinal: Soft and nontender.  Right CVA tenderness noted.  No distention. Genitourinary: deferred Musculoskeletal: No lower extremity tenderness nor edema. Neurologic:  Normal speech and language. No gross focal neurologic deficits are appreciated. Skin:  Skin is warm, dry and intact. No rash noted. Psychiatric: Mood and affect are normal. Speech and behavior are normal.  ____________________________________________   LABS (all labs  ordered are listed, but only abnormal results are displayed)  Labs Reviewed  URINALYSIS, COMPLETE (UACMP) WITH MICROSCOPIC - Abnormal; Notable for the following components:      Result Value   Color, Urine STRAW (*)    APPearance CLEAR (*)    Specific Gravity, Urine 1.004 (*)    Hgb urine dipstick SMALL (*)    All other components within normal limits  URINE CULTURE  COMPREHENSIVE METABOLIC PANEL  CBC WITH DIFFERENTIAL/PLATELET  POC URINE PREG, ED    PROCEDURES  Procedure(s) performed (including Critical Care):  Procedures   ____________________________________________   INITIAL IMPRESSION / ASSESSMENT AND PLAN / ED COURSE      40 year old female with past medical history of hypertension, hyperlipidemia, migraines, and recurrent UTI who presents to the ED with urinary symptoms for the past 4 days, developing right flank pain yesterday.  Vital signs are reassuring and not consistent with sepsis, patient does have right CVA tenderness concerning for pyelonephritis versus kidney stones.  Pregnancy testing is negative, we will treat symptomatically with IV Toradol.  Initial labs are  unremarkable, we will further assess with UA and CT scan.  Patient turned over to oncoming provider pending additional results and reassessment.      ____________________________________________   FINAL CLINICAL IMPRESSION(S) / ED DIAGNOSES  Final diagnoses:  Right flank pain     ED Discharge Orders     None        Note:  This document was prepared using Dragon voice recognition software and may include unintentional dictation errors.    Blake Divine, MD 11/09/20 [phone]

## 2020-11-08 NOTE — ED Notes (Signed)
Pt up to the bathroom indep. Urine specimen obtained.

## 2020-11-08 NOTE — ED Provider Notes (Signed)
-----------------------------------------   11:07 PM on 11/08/2020 -----------------------------------------  Assuming care from Dr. Charna Archer.  In short, Alisha Hughes is a 40 y.o. female with a chief complaint of flank pain.  Refer to the original H&P for additional details.  The current plan of care is to follow up on UA and CT scan.   ----------------------------------------- 12:41 AM on 11/09/2020 -----------------------------------------  Urinalysis is reassuring, just a very small amount of hemoglobin which is nonspecific.  No evidence of infection.  CT scan shows nephrolithiasis but without ureterolithiasis.  Patient reports that she knows she has had kidney stones in the past.  We discussed the possibility that she had a small stone which she passed and is still having some residual discomfort.  No indication for antibiotics other than the Macrobid she takes at baseline.  I recommended ibuprofen 3 times a day with meals and 1 g of Tylenol every 6 hours, drink plenty fluids, etc.  I gave my usual and customary return precautions and she understands and agrees with the plan.   Hinda Kehr, MD 11/09/20 [phone]

## 2020-11-09 ENCOUNTER — Other Ambulatory Visit: Payer: Self-pay

## 2020-11-09 MED ORDER — TAMSULOSIN HCL 0.4 MG PO CAPS
0.4000 mg | ORAL_CAPSULE | Freq: Every day | ORAL | 3 refills | Status: DC
Start: 2020-11-09 — End: 2021-03-05

## 2020-11-09 NOTE — Telephone Encounter (Signed)
See note

## 2020-11-09 NOTE — Discharge Instructions (Addendum)
Your workup in the Emergency Department today was reassuring.  We did not find any specific abnormalities to account for your symptoms.  You have kidney stones but no stones in your ureter; it is possible that she had a small stone which she passed but you are still having some residual discomfort.  We recommend you drink plenty of fluids, take your regular medications and/or any new ones prescribed today, and follow up with the doctor(s) listed in these documents as recommended.  You may consider taking ibuprofen 600 mg 3 times a day with meals, in addition to Tylenol 1000 mg (2 extra strength) no more often than every 6 hours.  Be sure to drink plenty of fluids.  Return to the Emergency Department if you develop new or worsening symptoms that concern you.

## 2020-11-10 LAB — URINE CULTURE

## 2020-12-18 ENCOUNTER — Encounter: Payer: Self-pay | Admitting: Physician Assistant

## 2020-12-28 ENCOUNTER — Emergency Department
Admission: EM | Admit: 2020-12-28 | Discharge: 2020-12-28 | Disposition: A | Discharge status: Home / Self Care | Payer: BC Managed Care – PPO | Attending: Emergency Medicine | Admitting: Emergency Medicine

## 2020-12-28 ENCOUNTER — Emergency Department: Payer: BC Managed Care – PPO

## 2020-12-28 ENCOUNTER — Other Ambulatory Visit: Payer: Self-pay

## 2020-12-28 DIAGNOSIS — I10 Essential (primary) hypertension: Secondary | ICD-10-CM | POA: Insufficient documentation

## 2020-12-28 DIAGNOSIS — R1030 Lower abdominal pain, unspecified: Secondary | ICD-10-CM | POA: Diagnosis not present

## 2020-12-28 DIAGNOSIS — R102 Pelvic and perineal pain: Secondary | ICD-10-CM | POA: Diagnosis not present

## 2020-12-28 DIAGNOSIS — Z91040 Latex allergy status: Secondary | ICD-10-CM | POA: Insufficient documentation

## 2020-12-28 DIAGNOSIS — N83201 Unspecified ovarian cyst, right side: Secondary | ICD-10-CM | POA: Diagnosis not present

## 2020-12-28 DIAGNOSIS — Z79899 Other long term (current) drug therapy: Secondary | ICD-10-CM | POA: Diagnosis not present

## 2020-12-28 LAB — URINALYSIS, COMPLETE (UACMP) WITH MICROSCOPIC
Bacteria, UA: NONE SEEN
Bilirubin Urine: NEGATIVE
Glucose, UA: NEGATIVE mg/dL
Ketones, ur: NEGATIVE mg/dL
Leukocytes,Ua: NEGATIVE
Nitrite: NEGATIVE
Protein, ur: NEGATIVE mg/dL
Specific Gravity, Urine: 1.004 — ABNORMAL LOW (ref 1.005–1.030)
pH: 7 (ref 5.0–8.0)

## 2020-12-28 LAB — COMPREHENSIVE METABOLIC PANEL
ALT: 11 U/L (ref 0–44)
AST: 14 U/L — ABNORMAL LOW (ref 15–41)
Albumin: 4 g/dL (ref 3.5–5.0)
Alkaline Phosphatase: 69 U/L (ref 38–126)
Anion gap: 3 — ABNORMAL LOW (ref 5–15)
BUN: 9 mg/dL (ref 6–20)
CO2: 31 mmol/L (ref 22–32)
Calcium: 8.9 mg/dL (ref 8.9–10.3)
Chloride: 104 mmol/L (ref 98–111)
Creatinine, Ser: 0.74 mg/dL (ref 0.44–1.00)
GFR, Estimated: >60 mL/min (ref >60)
Glucose, Bld: 97 mg/dL (ref 70–99)
Potassium: 4.3 mmol/L (ref 3.5–5.1)
Sodium: 138 mmol/L (ref 135–145)
Total Bilirubin: 0.7 mg/dL (ref 0.3–1.2)
Total Protein: 7.2 g/dL (ref 6.5–8.1)

## 2020-12-28 LAB — CBC
HCT: 43.3 % (ref 36.0–46.0)
Hemoglobin: 14.4 g/dL (ref 12.0–15.0)
MCH: 28.5 pg (ref 26.0–34.0)
MCHC: 33.3 g/dL (ref 30.0–36.0)
MCV: 85.6 fL (ref 80.0–100.0)
Platelets: 303 10*3/uL (ref 150–400)
RBC: 5.06 MIL/uL (ref 3.87–5.11)
RDW: 13.2 % (ref 11.5–15.5)
WBC: 5.2 10*3/uL (ref 4.0–10.5)
nRBC: 0 % (ref 0.0–0.2)

## 2020-12-28 LAB — LIPASE, BLOOD: Lipase: 28 U/L (ref 11–51)

## 2020-12-28 LAB — POC URINE PREG, ED: Preg Test, Ur: NEGATIVE

## 2020-12-28 MED ORDER — KETOROLAC TROMETHAMINE 60 MG/2ML IM SOLN
60.0000 mg | Freq: Once | INTRAMUSCULAR | Status: AC
  Administered 2020-12-28: 60 mg via INTRAMUSCULAR
  Filled 2020-12-28: qty 2

## 2020-12-28 MED ORDER — ONDANSETRON 4 MG PO TBDP
4.0000 mg | ORAL_TABLET | Freq: Once | ORAL | Status: AC
  Administered 2020-12-28: 4 mg via ORAL
  Filled 2020-12-28: qty 1

## 2020-12-28 MED ORDER — OXYCODONE-ACETAMINOPHEN 5-325 MG PO TABS: 1.0000 | ORAL_TABLET | Freq: Four times a day (QID) | ORAL | 0 refills | Status: DC | PRN

## 2020-12-28 MED ORDER — OXYCODONE-ACETAMINOPHEN 5-325 MG PO TABS
2.0000 | ORAL_TABLET | Freq: Once | ORAL | Status: AC
  Administered 2020-12-28: 2 via ORAL
  Filled 2020-12-28: qty 2

## 2020-12-28 MED ORDER — ONDANSETRON 4 MG PO TBDP: 4.0000 mg | ORAL_TABLET | Freq: Three times a day (TID) | ORAL | 0 refills | Status: DC | PRN

## 2020-12-28 MED ORDER — IBUPROFEN 600 MG PO TABS: 600.0000 mg | ORAL_TABLET | Freq: Three times a day (TID) | ORAL | 0 refills | Status: DC | PRN

## 2020-12-28 NOTE — ED Triage Notes (Signed)
[  T c/o LLQ pain , states "I have left ovarian pain". States she has  a hx of ovarian cyst and this feels the same, states the pain started last night

## 2020-12-28 NOTE — ED Provider Notes (Signed)
[facility] Emergency Department Provider Note  ____________________________________________   Event Date/Time   First MD Initiated Contact with Patient 12/28/20 1034     (approximate)  I have reviewed the triage vital signs and the nursing notes.   HISTORY  Chief Complaint Pelvic Pain    HPI Alisha Hughes is a 40 y.o. female  with PMHx HTN, HLD, here with lower abdominal pain.  The patient states that she was sexually active with her husband last night around 10:00 when she expects acute onset of severe, sharp, stabbing, lower abdominal pain.  She states it was primarily in the left adnexal/lower abdominal pain.  The pain has persisted since then.  She was able to sleep with remaining still but anytime she moves now the pain becomes more severe.  It feels very similar to her recurrent pelvic cyst pain.  She has been on multiple hormone treatments for this in the past without any kind of relief.  She is had no nausea or vomiting.  No syncope.  No vaginal bleeding.  No discharge.  No concern for STDs or STIs.    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    Bilateral tubal ligation with  filshie clips.;  Surgeon: Donnamae Jude, MD;  Location: [facility];  Service: Obstetrics;  Laterality: N/A;   CHOLECYSTECTOMY  2010   COLONOSCOPY     2009   COLPOSCOPY VULVA W/ BIOPSY     abnormal pap   ENDOMETRIAL ABLATION N/A 07/17/2015   Procedure: ENDOMETRIAL ABLATION WITH HTA, DILAT[place] AND CURRATAGE;  Surgeon: Emily Filbert, MD;  Location: [facility];  Service: Gynecology;  Laterality: N/A;   KNEE ARTHROSCOPY     LAPAROSCOPIC LYSIS OF ADHESIONS  07/17/2015   Procedure: LAPAROSCOPIC LYSIS OF ADHESIONS;  Surgeon: Emily Filbert, MD;  Location: [facility];  Service: Gynecology;;   LAPAROSCOPY N/A 07/17/2015   Procedure: LAPAROSCOPY DIAGNOSTIC;  Surgeon: Emily Filbert, MD;  Location: [facility];  Service: Gynecology;  Laterality: N/A;   TUBAL LIGATION     UPPER GI ENDOSCOPY     WISDOM TOOTH EXTRACTION      Prior to Admission medications   Medication Sig Start Date End Date Taking? Authorizing Provider  amLODipine (NORVASC) 10 MG tablet Take 1 tablet (10 mg total) by mouth daily. 07/11/20  Yes Inda Coke, PA  ibuprofen (ADVIL) 600 MG tablet Take 1 tablet (600 mg total) by mouth every 8 (eight) hours as needed for moderate pain. 12/28/20  Yes Duffy Bruce, MD  lisinopril-hydrochlorothiazide (ZESTORETIC) 20-25 MG tablet Take 1 tablet by mouth daily. 07/11/20  Yes Inda Coke, PA  ondansetron (ZOFRAN ODT) 4 MG disintegrating tablet Take 1 tablet (  4 mg total) by mouth every 8 (eight) hours as needed for nausea or vomiting. 12/28/20  Yes Duffy Bruce, MD  oxyCODONE-acetaminophen (PERCOCET) 5-325 MG tablet Take 1-2 tablets by mouth every 6 (six) hours as needed for severe pain (no more than 6 tabs daily). 12/28/20 12/28/21 Yes Duffy Bruce, MD  cephALEXin (KEFLEX) 500 MG capsule Take 1 capsule (500 mg total) by mouth 2 (two) times daily. Patient not taking: No sig reported 07/25/20   Inda Coke, PA  fluticasone [facility]) 50 MCG/ACT nasal spray Place 2 sprays into both nostrils daily. Patient not  taking: Reported on 12/28/2020 03/27/20   Muthersbaugh, Jarrett Soho, PA-C  nitrofurantoin, macrocrystal-monohydrate, (MACROBID) 100 MG capsule Take 1 capsule (100 mg total) by mouth 2 (two) times daily. Patient not taking: No sig reported 11/06/20   Vivi Barrack, MD  tamsulosin (FLOMAX) 0.4 MG CAPS capsule Take 1 capsule (0.4 mg total) by mouth daily. Patient not taking: Reported on 12/28/2020 11/09/20   Vivi Barrack, MD  valACYclovir (VALTREX) 500 MG tablet Take 1 tablet (500 mg total) by mouth daily. Can increase to twice a day for 5 days in the event of a recurrence Patient not taking: Reported on 12/28/2020 08/23/19   Inda Coke, PA  buPROPion (WELLBUTRIN XL) 150 MG 24 hr tablet Take 150 mg ER PO qam. Patient not taking: Reported on 05/09/2019 11/01/18 01/04/20  Inda Coke, PA    Allergies Latex, Metoclopramide, and Sertraline  Family History  Problem Relation Age of Onset   Hypertension Mother    Anuerysm Mother        brain   Migraines Mother    Heart disease Father    Hypertension Father    Hyperlipidemia Father    Prostate cancer Father    Depression Brother    Multiple sclerosis Maternal Grandmother    Sudden death Maternal Grandmother    Heart disease Maternal Grandfather    Sudden death Maternal Grandfather    Heart disease Paternal Grandmother    Diabetes Paternal Grandmother    Parkinson's disease Paternal Grandfather     Social History Social History   Tobacco Use   Smoking status: Never   Smokeless tobacco: Never  Vaping Use   Vaping Use: Never used  Substance Use Topics   Alcohol use: Yes    Alcohol/week: 0.0 standard drinks    Comment: rarely   Drug use: No    Review of Systems  Review of Systems  Constitutional:  Negative for chills and fever.  HENT:  Negative for sore throat.   Respiratory:  Negative for shortness of breath.   Cardiovascular:  Negative for chest pain.  Gastrointestinal:  Positive for abdominal pain.  Genitourinary:   Positive for pelvic pain. Negative for flank pain.  Musculoskeletal:  Negative for neck pain.  Skin:  Negative for rash and wound.  Allergic/Immunologic: Negative for immunocompromised state.  Neurological:  Negative for weakness and numbness.  Hematological:  Does not bruise/bleed easily.  All other systems reviewed and are negative.   ____________________________________________  PHYSICAL EXAM:      VITAL SIGNS: ED Triage Vitals [12/28/20 0857]  Enc Vitals Group     BP (!) 164/106     Pulse Rate 79     Resp 18     Temp 98 F (36.7 C)     Temp Source Oral     SpO2 99 %     Weight      Height      Head Circumference  Peak Flow      Pain Score      Pain Loc      Pain Edu?      Excl. in [place]?      Physical Exam Vitals and nursing note reviewed.  Constitutional:      General: She is not in acute distress.    Appearance: She is well-developed.  HENT:     Head: Normocephalic and atraumatic.  Eyes:     Conjunctiva/sclera: Conjunctivae normal.  Cardiovascular:     Rate and Rhythm: Normal rate and regular rhythm.     Heart sounds: Normal heart sounds.  Pulmonary:     Effort: Pulmonary effort is normal. No respiratory distress.     Breath sounds: No wheezing.  Abdominal:     General: There is no distension.     Tenderness: There is abdominal tenderness (Mild lower abdominal, particular left lower quadrant).  Musculoskeletal:     Cervical back: Neck supple.  Skin:    General: Skin is warm.     Capillary Refill: Capillary refill takes less than 2 seconds.     Findings: No rash.  Neurological:     Mental Status: She is alert and oriented to person, place, and time.     Motor: No abnormal muscle tone.      ____________________________________________   LABS (all labs ordered are listed, but only abnormal results are displayed)  Labs Reviewed  COMPREHENSIVE METABOLIC PANEL - Abnormal; Notable for the following components:      Result Value   AST 14 (*)     Anion gap 3 (*)    All other components within normal limits  URINALYSIS, COMPLETE (UACMP) WITH MICROSCOPIC - Abnormal; Notable for the following components:   Color, Urine STRAW (*)    APPearance CLEAR (*)    Specific Gravity, Urine 1.004 (*)    Hgb urine dipstick SMALL (*)    All other components within normal limits  LIPASE, BLOOD  CBC  POC URINE PREG, ED    ____________________________________________  EKG:  ________________________________________  RADIOLOGY All imaging, including plain films, CT scans, and ultrasounds, independently reviewed by me, and interpretations confirmed via formal radiology reads.  ED MD interpretation:   Ultrasound: Thick-walled cyst of the right ovary with internal echoes likely representing corpus luteal cyst, small volume free fluid  Official radiology report(s): US PELVIC COMPLETE W TRANSVAGINAL AND TORSION R/O  Result Date: 12/28/2020 CLINICAL DATA:  Left-sided pelvic pain since yesterday EXAM: TRANSABDOMINAL AND TRANSVAGINAL ULTRASOUND OF PELVIS DOPPLER ULTRASOUND OF OVARIES TECHNIQUE: Both transabdominal and transvaginal ultrasound examinations of the pelvis were performed. Transabdominal technique was performed for global imaging of the pelvis including uterus, ovaries, adnexal regions, and pelvic cul-de-sac. It was necessary to proceed with endovaginal exam following the transabdominal exam to visualize the ovaries and adnexa. Color and duplex Doppler ultrasound was utilized to evaluate blood flow to the ovaries. COMPARISON:  Ultrasound December 31, 2016 and CT November 08, 2020 FINDINGS: Uterus Measurements: 8.3 x 4.6 x 3.8 cm = volume: 73 mL. No fibroids or other mass visualized. Endometrium Thickness: 10 mm.  No focal abnormality visualized. Right ovary Measurements: 3.2 x 2.5 x 2.1 cm = volume: 8.8 mL. Thick wall crenulated ovarian cyst with some internal echoes measuring up to 1.9 cm. Left ovary Measurements: 2 x 1.6 x 1.5 cm = volume: 3 mL.  Normal appearance/no adnexal mass. Pulsed Doppler evaluation of both ovaries demonstrates normal low-resistance arterial and venous waveforms. Other findings Small volume simple appearing free  fluid. IMPRESSION: 1. Negative for adnexal torsion. 2. Thick wall crenulated cyst of the right ovary with some internal echoes, likely representing a collapsing corpus luteal cyst. 3. Small volume simple appearing free fluid in the pelvis, which may represent sequela of cyst rupture but is also within upper limits of physiologic normal. Electronically Signed   By: Dahlia Bailiff M.D.   On: 12/28/2020 10:27    ____________________________________________  PROCEDURES   Procedure(s) performed (including Critical Care):  Procedures  ____________________________________________  INITIAL IMPRESSION / MDM / [place] / ED COURSE  As part of my medical decision making, I reviewed the following data within the [place] notes reviewed and incorporated, Old chart reviewed, Notes from prior ED visits, and [place] Controlled Substance Database       *AZALYN MOLE was evaluated in Emergency Department on 12/28/2020 for the symptoms described in the history of present illness. She was evaluated in the context of the global COVID-19 pandemic, which necessitated consideration that the patient might be at risk for infection with the SARS-CoV-2 virus that causes COVID-19. Institutional protocols and algorithms that pertain to the evaluation of patients at risk for COVID-19 are in a state of rapid change based on information released by regulatory bodies including the CDC and federal and state organizations. These policies and algorithms were followed during the patient's care in the ED.  Some ED evaluations and interventions may be delayed as a result of limited staffing during the pandemic.*     Medical Decision Making: 40 year old female here with severe lower abdominal/adnexal pain.   Suspect ruptured ovarian cyst.  Interestingly, ultrasound shows collapsing cyst on the right with free fluid.  This could be referred pain from the right-sided cyst versus a ruptured cyst from the left.  She has a history of bilateral cysts.  Otherwise, she has no vaginal bleeding or evidence to suggest vaginal or cervical trauma.  No concerns for STD or STI.  UA negative.  No leukocytosis or signs of infection.  No evidence of torsion on ultrasound.  She has a well-documented history of similar pain due to cyst.  Will treat symptomatically and discharged with outpatient OB follow-up.  Return precautions given.  ____________________________________________  FINAL CLINICAL IMPRESSION(S) / ED DIAGNOSES  Final diagnoses:  Pelvic pain  Cyst of right ovary     MEDICATIONS GIVEN DURING THIS VISIT:  Medications  ketorolac (TORADOL) injection 60 mg (60 mg Intramuscular Given 12/28/20 1059)  oxyCODONE-acetaminophen (PERCOCET/ROXICET) 5-325 MG per tablet 2 tablet (2 tablets Oral Given 12/28/20 1058)  ondansetron (ZOFRAN-ODT) disintegrating tablet 4 mg (4 mg Oral Given 12/28/20 1058)     ED Discharge Orders          Ordered    oxyCODONE-acetaminophen (PERCOCET) 5-325 MG tablet  Every 6 hours PRN        12/28/20 1055    ondansetron (ZOFRAN ODT) 4 MG disintegrating tablet  Every 8 hours PRN        12/28/20 1055    ibuprofen (ADVIL) 600 MG tablet  Every 8 hours PRN        12/28/20 1055             Note:  This document was prepared using Dragon voice recognition software and may include unintentional dictation errors.   Duffy Bruce, MD 12/28/20 [phone]

## 2021-01-01 ENCOUNTER — Ambulatory Visit: Payer: BC Managed Care – PPO | Admitting: Obstetrics and Gynecology

## 2021-01-01 ENCOUNTER — Encounter: Payer: Self-pay | Admitting: Obstetrics and Gynecology

## 2021-01-01 ENCOUNTER — Other Ambulatory Visit: Payer: Self-pay

## 2021-01-01 VITALS — BP 143/95 | HR 80 | Wt 178.0 lb

## 2021-01-01 DIAGNOSIS — N83201 Unspecified ovarian cyst, right side: Secondary | ICD-10-CM | POA: Diagnosis not present

## 2021-01-01 DIAGNOSIS — I10 Essential (primary) hypertension: Secondary | ICD-10-CM | POA: Diagnosis not present

## 2021-01-01 DIAGNOSIS — N946 Dysmenorrhea, unspecified: Secondary | ICD-10-CM | POA: Diagnosis not present

## 2021-01-01 DIAGNOSIS — N920 Excessive and frequent menstruation with regular cycle: Secondary | ICD-10-CM

## 2021-01-01 NOTE — Progress Notes (Signed)
Obstetrics and Gynecology Established Patient Evaluation  Appointment Date: 01/01/2021  OBGYN Clinic: [facility]  Primary Care Provider: Inda Coke  Referring Provider: [facility] ED  Chief Complaint:  Chief Complaint  Patient presents with   Follow-up    History of Present Illness: Alisha Hughes is a 40 y.o. [id number] (Patient's last menstrual period was 12/14/2020 (approximate).), seen for the above chief complaint. Her past medical history is significant for recurrent symptomatic ovarian cysts, BMI 30s, h/o c/s x 2, h/o BTL with endometrial ablation, HTN   Patient most recently seen in the ED on 9/16 for abdominal pain and dx with presumptive collapsing right ovarian CL cyst based on u/s findings. CBC and other labs normal.    Patient has has multiple episodes of pain attributed to ovarian cysts and has tried OCPs and depo provera in the past but did not like the weight gain.  The Ablation helped with her periods but they are still heavy, painful, regular and qmonth.   Currently still having some mild abdominal pain.   Review of Systems: Pertinent items noted in HPI and remainder of comprehensive ROS otherwise negative.    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   Pain in right knee 09/29/2017   Overweight 08/12/2017   Irritable bowel syndrome with diarrhea 07/15/2016   Vitamin D deficiency 07/15/2016   Anxiety and depression 02/11/2016   Essential hypertension 02/08/2013   Chronic migraine without aura 12/07/2012    Past Medical History:  Past Medical History:  Diagnosis Date   Abnormal ECG 02/03/2018   Anxiety and depression    HSV (herpes simplex virus) infection 02/10/2018   Hyperlipidemia    Hypertension     Past Surgical History:  Past Surgical History:  Procedure  Laterality Date   CESAREAN SECTION     CESAREAN SECTION N/A 12/19/2012   Procedure: Repeat cesarean section with delivery of baby girl at 51.    Bilateral tubal ligation with filshie clips.;  Surgeon: Donnamae Jude, MD;  Location: [facility];  Service: Obstetrics;  Laterality: N/A;   CHOLECYSTECTOMY  2010   COLONOSCOPY     2009   COLPOSCOPY VULVA W/ BIOPSY     abnormal pap   ENDOMETRIAL ABLATION N/A 07/17/2015   Procedure: ENDOMETRIAL ABLATION WITH HTA, DILATATION AND CURRATAGE;  Surgeon: Emily Filbert, MD;  Location: [facility];  Service: Gynecology;  Laterality: N/A;   KNEE ARTHROSCOPY     LAPAROSCOPIC LYSIS OF ADHESIONS  07/17/2015   Procedure: LAPAROSCOPIC LYSIS OF ADHESIONS;  Surgeon: Emily Filbert, MD;  Location: [facility];  Service: Gynecology;;   LAPAROSCOPY N/A 07/17/2015   Procedure: LAPAROSCOPY DIAGNOSTIC;  Surgeon: Emily Filbert, MD;  Location: [facility] [facility];  Service: Gynecology;  Laterality: N/A;   TUBAL LIGATION     UPPER GI ENDOSCOPY     WISDOM TOOTH EXTRACTION      Past Obstetrical History:  OB History  Gravida Para Term Preterm AB Living  2 2 1 1  0 1  SAB IAB Ectopic Multiple Live Births  0 0 0   1    # Outcome Date GA Lbr Len/2nd Weight Sex Delivery Anes PTL Lv  2 Term 12/19/12 [redacted]w[redacted]d   F CS-LTranv Spinal    1 Preterm 12/11/09 [redacted]w[redacted]d   F CS-LTranv EPI  LIV    Past Gynecological History: As  per HPI. History of Pap Smear(s): Yes.   Last pap 2021, which was negative and hpv negative  Social History:  Social History   Socioeconomic History   Marital status: Married    Spouse name: Not on file   Number of children: Not on file   Years of education: Not on file   Highest education level: Not on file  Occupational History   Not on file  Tobacco Use   Smoking status: Never   Smokeless tobacco: Never  Vaping Use   Vaping Use: Never used  Substance and Sexual Activity   Alcohol use: Yes    Alcohol/week: 0.0 standard drinks    Comment: rarely   Drug use: No   Sexual activity: Yes     Partners: Male    Birth control/protection: Surgical  Other Topics Concern   Not on file  Social History Narrative   ** Merged History Encounter **       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, 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 Fun: exercise, running, spending time with family, going to 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   Social Determinants of Health   Financial Resource Strain: Not on file  Food Insecurity: Not on file  Transportation Needs: Not on file  Physical Activity: Not on file  Stress: Not on file  Social Connections: Not on file  Intimate Partner Violence: Not on file    Family History:  Family History  Problem Relation Age of Onset   Hypertension Mother    Anuerysm Mother        brain   Migraines Mother    Heart disease Father    Hypertension Father    Hyperlipidemia Father    Prostate cancer Father    Depression Brother    Multiple sclerosis Maternal Grandmother    Sudden death Maternal Grandmother    Heart disease Maternal Grandfather    Sudden death Maternal Grandfather    Heart disease Paternal Grandmother    Diabetes Paternal Grandmother    Parkinson's disease Paternal Grandfather     Medications Alisha Hughes had no medications administered during this visit. Current Outpatient Medications  Medication Sig Dispense Refill   amLODipine (NORVASC) 10 MG tablet Take 1 tablet (10 mg total) by mouth daily. 90 tablet 3   ibuprofen (ADVIL) 600 MG tablet Take 1 tablet (600 mg total) by mouth every 8 (eight) hours as needed for moderate pain. 20 tablet 0   lisinopril-hydrochlorothiazide (ZESTORETIC) 20-25 MG tablet Take 1 tablet by mouth daily. 90 tablet 3   ondansetron (ZOFRAN ODT) 4 MG disintegrating tablet Take 1 tablet (4 mg total) by mouth every 8 (eight) hours as needed for nausea or vomiting. 20 tablet 0   oxyCODONE-acetaminophen (PERCOCET) 5-325 MG tablet Take 1-2 tablets by mouth every 6 (six) hours as needed for severe pain (no more  than 6 tabs daily). 20 tablet 0   cephALEXin (KEFLEX) 500 MG capsule Take 1 capsule (500 mg total) by mouth 2 (two) times daily. (Patient not taking: No sig reported) 14 capsule 0   fluticasone (FLONASE) 50 MCG/ACT nasal spray Place 2 sprays into both nostrils daily. (Patient not taking: Reported on 12/28/2020) 9.9 g 0   nitrofurantoin, macrocrystal-monohydrate, (MACROBID) 100 MG capsule Take 1 capsule (100 mg total) by mouth 2 (two) times daily. (Patient not taking: No sig reported) 10 capsule 0   tamsulosin (FLOMAX) 0.4 MG CAPS capsule Take 1 capsule (0.4 mg total) by mouth daily. (  Patient not taking: Reported on 12/28/2020) 30 capsule 3   valACYclovir (VALTREX) 500 MG tablet Take 1 tablet (500 mg total) by mouth daily. Can increase to twice a day for 5 days in the event of a recurrence (Patient not taking: Reported on 12/28/2020) 30 tablet 5   No current facility-administered medications for this visit.    Allergies Latex, Metoclopramide, and Sertraline   Physical Exam:  BP (!) 143/95   Pulse 80   Wt 178 lb (80.7 kg)   LMP 12/14/2020 (Approximate)   BMI 31.53 kg/m  Body mass index is 31.53 kg/m. General appearance: Well nourished, well developed female in no acute distress.  Respiratory:  Normal respiratory effort Neuro/Psych:  Normal mood and affect.  Skin:  Warm and dry.   Laboratory: ED negative UPT, u/a, cbc, cmp, lipase  Radiology:  Narrative & Impression  CLINICAL DATA:  Left-sided pelvic pain since yesterday   EXAM: TRANSABDOMINAL AND TRANSVAGINAL ULTRASOUND OF PELVIS   DOPPLER ULTRASOUND OF OVARIES   TECHNIQUE: Both transabdominal and transvaginal ultrasound examinations of the pelvis were performed. Transabdominal technique was performed for global imaging of the pelvis including uterus, ovaries, adnexal regions, and pelvic cul-de-sac.   It was necessary to proceed with endovaginal exam following the transabdominal exam to visualize the ovaries and adnexa. Color  and duplex Doppler ultrasound was utilized to evaluate blood flow to the ovaries.   COMPARISON:  Ultrasound December 31, 2016 and CT November 08, 2020   FINDINGS: Uterus   Measurements: 8.3 x 4.6 x 3.8 cm = volume: 73 mL. No fibroids or other mass visualized.   Endometrium   Thickness: 10 mm.  No focal abnormality visualized.   Right ovary   Measurements: 3.2 x 2.5 x 2.1 cm = volume: 8.8 mL. Thick wall crenulated ovarian cyst with some internal echoes measuring up to 1.9 cm.   Left ovary   Measurements: 2 x 1.6 x 1.5 cm = volume: 3 mL. Normal appearance/no adnexal mass.   Pulsed Doppler evaluation of both ovaries demonstrates normal low-resistance arterial and venous waveforms.   Other findings   Small volume simple appearing free fluid.   IMPRESSION: 1. Negative for adnexal torsion. 2. Thick wall crenulated cyst of the right ovary with some internal echoes, likely representing a collapsing corpus luteal cyst. 3. Small volume simple appearing free fluid in the pelvis, which may represent sequela of cyst rupture but is also within upper limits of physiologic normal.     Electronically Signed   By: Dahlia Bailiff M.D.   On: 12/28/2020 10:27    Assessment: pt stable  Plan:  Options d/w patient in length.  I told her that hormone suppression with OCPs or depo are the only real options for cyst suppression, but may be of limited effectiveness given she can't be on estrogen and their MOA, in addition to her reaction to it in the past.  She is interested in getting her ovaries removed. I told her re: need for HRT until at least age 79 due to help ameliorate CV, GI, bone risks associated with early menopause. I also told her risks with HRT and potential to not be as good as her estrogen in terms of the above mentioned protective effects and with potential to cause mood, sexual, etc changes but that in general HRT especially transdermal is very well tolerated and patients do  very well with it but there are some risks associated with it such as HTN, VTE, MI, CVA, etc. She has heavy  and painful periods and I d/w her options being the same as for treatment of her ovarian cysts, given her h/o ablation and a hysterectomy is a very reasonable option. Patient is interested in hyst and bso and this was d/w her and her husband and they are to think about her options and let me know.    RTC PRN  Durene Romans MD Attending Center for [employer] farm manager)

## 2021-01-01 NOTE — Patient Instructions (Signed)
[facility]

## 2021-01-11 ENCOUNTER — Telehealth: Payer: Self-pay | Admitting: *Deleted
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
## 2021-01-14 ENCOUNTER — Other Ambulatory Visit: Payer: Self-pay | Admitting: Obstetrics and Gynecology

## 2021-01-14 NOTE — Telephone Encounter (Signed)
Call to patient regarding surgery dates. Agreeable to proceed on 10-11. Will schedule and call her back once confirmed.

## 2021-01-14 NOTE — Telephone Encounter (Signed)
Call back to patient and advised surgery is scheduled as discussed.Scheduled on 01-22-21 at 1130 at [facility]. Arrive 0930. Surgery instructions reviewed and will send copy via My Chart.  Encounter closed.

## 2021-01-14 NOTE — H&P (View-Only) (Signed)
Obstetrics and Gynecology Surgical H&P   Appointment Date: 01/01/2021   OBGYN Clinic: [facility]   Primary Care Provider: Inda Coke   Referring Provider: [facility] ED   Chief Complaint:     Chief Complaint  Patient presents with   Follow-up      History of Present Illness: Alisha Hughes is a 40 y.o. [id number] (Patient's last menstrual period was 12/14/2020 (approximate).), seen for the above chief complaint. Her past medical history is significant for recurrent symptomatic ovarian cysts, BMI 30s, h/o c/s x 2, h/o BTL with endometrial ablation, HTN    Patient most recently seen in the ED on 9/16 for abdominal pain and dx with presumptive collapsing right ovarian CL cyst based on u/s findings. CBC and other labs normal.     Patient has has multiple episodes of pain attributed to ovarian cysts and has tried OCPs and depo provera in the past but did not like the weight gain.  The Ablation helped with her periods but they are still heavy, painful, regular and qmonth.    Currently still having some mild abdominal pain.    Review of Systems: Pertinent items noted in HPI and remainder of comprehensive ROS otherwise negative.          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   Pain in right knee 09/29/2017   Overweight 08/12/2017   Irritable bowel syndrome with diarrhea 07/15/2016   Vitamin D deficiency 07/15/2016   Anxiety and depression 02/11/2016   Essential hypertension 02/08/2013   Chronic migraine without aura 12/07/2012      Past Medical History:      Past Medical History:  Diagnosis Date   Abnormal ECG 02/03/2018   Anxiety and depression     HSV (herpes simplex virus) infection 02/10/2018   Hyperlipidemia     Hypertension        Past Surgical History:       Past Surgical  History:  Procedure Laterality Date   CESAREAN SECTION       CESAREAN SECTION N/A 12/19/2012    Procedure: Repeat cesarean section with delivery of baby girl at 44.    Bilateral tubal ligation with filshie clips.;  Surgeon: Donnamae Jude, MD;  Location: [place] [facility];  Service: Obstetrics;  Laterality: N/A;   CHOLECYSTECTOMY   2010   COLONOSCOPY        2009   COLPOSCOPY VULVA W/ BIOPSY        abnormal pap   ENDOMETRIAL ABLATION N/A 07/17/2015    Procedure: ENDOMETRIAL ABLATION WITH HTA, DILATATION AND CURRATAGE;  Surgeon: Emily Filbert, MD;  Location: [facility];  Service: Gynecology;  Laterality: N/A;   KNEE ARTHROSCOPY       LAPAROSCOPIC LYSIS OF ADHESIONS   07/17/2015    Procedure: LAPAROSCOPIC LYSIS OF ADHESIONS;  Surgeon: Emily Filbert, MD;  Location: [place] [facility];  Service: Gynecology;;   LAPAROSCOPY N/A 07/17/2015    Procedure: LAPAROSCOPY DIAGNOSTIC;  Surgeon: Emily Filbert, MD;  Location: [place];  Service: Gynecology;  Laterality: N/A;   TUBAL LIGATION       UPPER GI ENDOSCOPY       WISDOM TOOTH EXTRACTION          Past Obstetrical History:  OB History  Gravida Para Term Preterm AB Living  2 2 1 1  0 1  SAB IAB Ectopic Multiple Live Births     0 0 0   1        # Outcome Date GA Lbr Len/2nd Weight Sex Delivery Anes PTL Lv  2 Term 12/19/12 [redacted]w[redacted]d     F CS-LTranv Spinal      1 Preterm 12/11/09 [redacted]w[redacted]d     F CS-LTranv EPI   LIV      Past Gynecological History: As per HPI. History of Pap Smear(s): Yes.   Last pap 2021, which was negative and hpv negative   Social History:  Social History         Socioeconomic History   Marital status: Married      Spouse name: Not on file   Number of children: Not on file   Years of education: Not on file   Highest education level: Not on file  Occupational History   Not on file  Tobacco Use   Smoking status: Never   Smokeless tobacco: Never  Vaping Use   Vaping Use: Never used  Substance and Sexual Activity   Alcohol use: Yes       Alcohol/week: 0.0 standard drinks      Comment: rarely   Drug use: No   Sexual activity: Yes      Partners: Male      Birth control/protection: Surgical  Other Topics Concern   Not on file  Social History Narrative    ** Merged History Encounter **         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, 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 Fun: exercise, running, spending time with family, going to 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    Social Determinants of Health    Financial Resource Strain: Not on file  Food Insecurity: Not on file  Transportation Needs: Not on file  Physical Activity: Not on file  Stress: Not on file  Social Connections: Not on file  Intimate Partner Violence: Not on file      Family History:       Family History  Problem Relation Age of Onset   Hypertension Mother     Anuerysm Mother          brain   Migraines Mother     Heart disease Father     Hypertension Father     Hyperlipidemia Father     Prostate cancer Father     Depression Brother     Multiple sclerosis Maternal Grandmother     Sudden death Maternal Grandmother     Heart disease Maternal Grandfather     Sudden death Maternal Grandfather     Heart disease Paternal Grandmother     Diabetes Paternal Grandmother     Parkinson's disease Paternal Grandfather        Medications Aysel E. Wisenbaker had no medications administered during this visit.       Current Outpatient Medications  Medication Sig Dispense Refill   amLODipine (NORVASC) 10 MG tablet Take 1 tablet (10 mg total) by mouth daily. 90 tablet 3   ibuprofen (ADVIL) 600 MG tablet Take 1 tablet (600 mg total) by mouth every 8 (eight) hours as needed for moderate pain. 20 tablet 0   lisinopril-hydrochlorothiazide (ZESTORETIC) 20-25 MG tablet Take 1 tablet by mouth daily. 90 tablet 3   ondansetron (ZOFRAN ODT) 4 MG disintegrating tablet Take 1 tablet (4 mg  total) by mouth every 8 (eight) hours as needed for nausea or vomiting. 20  tablet 0   oxyCODONE-acetaminophen (PERCOCET) 5-325 MG tablet Take 1-2 tablets by mouth every 6 (six) hours as needed for severe pain (no more than 6 tabs daily). 20 tablet 0   cephALEXin (KEFLEX) 500 MG capsule Take 1 capsule (500 mg total) by mouth 2 (two) times daily. (Patient not taking: No sig reported) 14 capsule 0   fluticasone (FLONASE) 50 MCG/ACT nasal spray Place 2 sprays into both nostrils daily. (Patient not taking: Reported on 12/28/2020) 9.9 g 0   nitrofurantoin, macrocrystal-monohydrate, (MACROBID) 100 MG capsule Take 1 capsule (100 mg total) by mouth 2 (two) times daily. (Patient not taking: No sig reported) 10 capsule 0   tamsulosin (FLOMAX) 0.4 MG CAPS capsule Take 1 capsule (0.4 mg total) by mouth daily. (Patient not taking: Reported on 12/28/2020) 30 capsule 3   valACYclovir (VALTREX) 500 MG tablet Take 1 tablet (500 mg total) by mouth daily. Can increase to twice a day for 5 days in the event of a recurrence (Patient not taking: Reported on 12/28/2020) 30 tablet 5    No current facility-administered medications for this visit.      Allergies Latex, Metoclopramide, and Sertraline     Physical Exam:  BP (!) 143/95   Pulse 80   Wt 178 lb (80.7 kg)   LMP 12/14/2020 (Approximate)   BMI 31.53 kg/m  Body mass index is 31.53 kg/m. General appearance: Well nourished, well developed female in no acute distress.  Respiratory:  Normal respiratory effort Neuro/Psych:  Normal mood and affect.  Skin:  Warm and dry.    Laboratory: ED negative UPT, u/a, cbc, cmp, lipase   Radiology:  Narrative & Impression  CLINICAL DATA:  Left-sided pelvic pain since yesterday   EXAM: TRANSABDOMINAL AND TRANSVAGINAL ULTRASOUND OF PELVIS   DOPPLER ULTRASOUND OF OVARIES   TECHNIQUE: Both transabdominal and transvaginal ultrasound examinations of the pelvis were performed. Transabdominal technique was performed for global imaging of the pelvis including uterus, ovaries, adnexal regions, and  pelvic cul-de-sac.   It was necessary to proceed with endovaginal exam following the transabdominal exam to visualize the ovaries and adnexa. Color and duplex Doppler ultrasound was utilized to evaluate blood flow to the ovaries.   COMPARISON:  Ultrasound December 31, 2016 and CT November 08, 2020   FINDINGS: Uterus   Measurements: 8.3 x 4.6 x 3.8 cm = volume: 73 mL. No fibroids or other mass visualized.   Endometrium   Thickness: 10 mm.  No focal abnormality visualized.   Right ovary   Measurements: 3.2 x 2.5 x 2.1 cm = volume: 8.8 mL. Thick wall crenulated ovarian cyst with some internal echoes measuring up to 1.9 cm.   Left ovary   Measurements: 2 x 1.6 x 1.5 cm = volume: 3 mL. Normal appearance/no adnexal mass.   Pulsed Doppler evaluation of both ovaries demonstrates normal low-resistance arterial and venous waveforms.   Other findings   Small volume simple appearing free fluid.   IMPRESSION: 1. Negative for adnexal torsion. 2. Thick wall crenulated cyst of the right ovary with some internal echoes, likely representing a collapsing corpus luteal cyst. 3. Small volume simple appearing free fluid in the pelvis, which may represent sequela of cyst rupture but is also within upper limits of physiologic normal.     Electronically Signed   By: Dahlia Bailiff M.D.   On: 12/28/2020 10:27      Assessment: pt stable  Plan:  Options d/w patient in length and she decided on TLH/BSO with cystoscopy, knowing she will need to be on HRT for the next approximately 10 years with risks and limitations associated with HRT.      Durene Romans MD Attending Center for [employer] farm manager)

## 2021-01-14 NOTE — H&P (Signed)
Obstetrics and Gynecology Surgical H&P   Appointment Date: 01/01/2021   OBGYN Clinic: [facility]   Primary Care Provider: Inda Coke   Referring Provider: [facility]   Chief Complaint:     Chief Complaint  Patient presents with   Follow-up      History of Present Illness: Alisha Hughes is a 40 y.o. [id number] (Patient's last menstrual period was 12/14/2020 (approximate).), seen for the above chief complaint. Her past medical history is significant for recurrent symptomatic ovarian cysts, BMI 30s, h/o c/s x 2, h/o BTL with endometrial ablation, HTN    Patient most recently seen in the ED on 9/16 for abdominal pain and dx with presumptive collapsing right ovarian CL cyst based on u/s findings. CBC and other labs normal.     Patient has has multiple episodes of pain attributed to ovarian cysts and has tried OCPs and depo provera in the past but did not like the weight gain.  The Ablation helped with her periods but they are still heavy, painful, regular and qmonth.    Currently still having some mild abdominal pain.    Review of Systems: Pertinent items noted in HPI and remainder of comprehensive ROS otherwise negative.          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   Pain in right knee 09/29/2017   Overweight 08/12/2017   Irritable bowel syndrome with diarrhea 07/15/2016   Vitamin D deficiency 07/15/2016   Anxiety and depression 02/11/2016   Essential hypertension 02/08/2013   Chronic migraine without aura 12/07/2012      Past Medical History:      Past Medical History:  Diagnosis Date   Abnormal ECG 02/03/2018   Anxiety and depression     HSV (herpes simplex virus) infection 02/10/2018   Hyperlipidemia     Hypertension        Past Surgical History:       Past Surgical  History:  Procedure Laterality Date   CESAREAN SECTION       CESAREAN SECTION N/A 12/19/2012    Procedure: Repeat cesarean section with delivery of baby girl at 52.    Bilateral tubal ligation with filshie clips.;  Surgeon: Donnamae Jude, MD;  Location: [facility];  Service: Obstetrics;  Laterality: N/A;   CHOLECYSTECTOMY   2010   COLONOSCOPY        2009   COLPOSCOPY VULVA W/ BIOPSY        abnormal pap   ENDOMETRIAL ABLATION N/A 07/17/2015    Procedure: ENDOMETRIAL ABLATION WITH HTA, DILATATION AND CURRATAGE;  Surgeon: Emily Filbert, MD;  Location: [facility];  Service: Gynecology;  Laterality: N/A;   KNEE ARTHROSCOPY       LAPAROSCOPIC LYSIS OF ADHESIONS   07/17/2015    Procedure: LAPAROSCOPIC LYSIS OF ADHESIONS;  Surgeon: Emily Filbert, MD;  Location: [facility];  Service: Gynecology;;   LAPAROSCOPY N/A 07/17/2015    Procedure: LAPAROSCOPY DIAGNOSTIC;  Surgeon: Emily Filbert, MD;  Location: [facility];  Service: Gynecology;  Laterality: N/A;   TUBAL LIGATION       UPPER GI ENDOSCOPY       WISDOM TOOTH EXTRACTION          Past Obstetrical History:  OB History  Gravida Para Term Preterm AB Living  2 2 1 1  0 1  SAB IAB Ectopic Multiple Live Births     0 0 0   1        # Outcome Date GA Lbr Len/2nd Weight Sex Delivery Anes PTL Lv  2 Term 12/19/12 [redacted]w[redacted]d     F CS-LTranv Spinal      1 Preterm 12/11/09 [redacted]w[redacted]d     F CS-LTranv EPI   LIV      Past Gynecological History: As per HPI. History of Pap Smear(s): Yes.   Last pap 2021, which was negative and hpv negative   Social History:  Social History         Socioeconomic History   Marital status: Married      Spouse name: Not on file   Number of children: Not on file   Years of education: Not on file   Highest education level: Not on file  Occupational History   Not on file  Tobacco Use   Smoking status: Never   Smokeless tobacco: Never  Vaping Use   Vaping Use: Never used  Substance and Sexual Activity   Alcohol use: Yes       Alcohol/week: 0.0 standard drinks      Comment: rarely   Drug use: No   Sexual activity: Yes      Partners: Male      Birth control/protection: Surgical  Other Topics Concern   Not on file  Social History Narrative    ** Merged History Encounter **         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, 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 Fun: exercise, running, spending time with family, going to 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    Social Determinants of Health    Financial Resource Strain: Not on file  Food Insecurity: Not on file  Transportation Needs: Not on file  Physical Activity: Not on file  Stress: Not on file  Social Connections: Not on file  Intimate Partner Violence: Not on file      Family History:       Family History  Problem Relation Age of Onset   Hypertension Mother     Anuerysm Mother          brain   Migraines Mother     Heart disease Father     Hypertension Father     Hyperlipidemia Father     Prostate cancer Father     Depression Brother     Multiple sclerosis Maternal Grandmother     Sudden death Maternal Grandmother     Heart disease Maternal Grandfather     Sudden death Maternal Grandfather     Heart disease Paternal Grandmother     Diabetes Paternal Grandmother     Parkinson's disease Paternal Grandfather        Medications Anayi E. Weatherly had no medications administered during this visit.       Current Outpatient Medications  Medication Sig Dispense Refill   amLODipine (NORVASC) 10 MG tablet Take 1 tablet (10 mg total) by mouth daily. 90 tablet 3   ibuprofen (ADVIL) 600 MG tablet Take 1 tablet (600 mg total) by mouth every 8 (eight) hours as needed for moderate pain. 20 tablet 0   lisinopril-hydrochlorothiazide (ZESTORETIC) 20-25 MG tablet Take 1 tablet by mouth daily. 90 tablet 3   ondansetron (ZOFRAN ODT) 4 MG disintegrating tablet Take 1 tablet (4 mg  total) by mouth every 8 (eight) hours as needed for nausea or vomiting. 20  tablet 0   oxyCODONE-acetaminophen (PERCOCET) 5-325 MG tablet Take 1-2 tablets by mouth every 6 (six) hours as needed for severe pain (no more than 6 tabs daily). 20 tablet 0   cephALEXin (KEFLEX) 500 MG capsule Take 1 capsule (500 mg total) by mouth 2 (two) times daily. (Patient not taking: No sig reported) 14 capsule 0   fluticasone (FLONASE) 50 MCG/ACT nasal spray Place 2 sprays into both nostrils daily. (Patient not taking: Reported on 12/28/2020) 9.9 g 0   nitrofurantoin, macrocrystal-monohydrate, (MACROBID) 100 MG capsule Take 1 capsule (100 mg total) by mouth 2 (two) times daily. (Patient not taking: No sig reported) 10 capsule 0   tamsulosin (FLOMAX) 0.4 MG CAPS capsule Take 1 capsule (0.4 mg total) by mouth daily. (Patient not taking: Reported on 12/28/2020) 30 capsule 3   valACYclovir (VALTREX) 500 MG tablet Take 1 tablet (500 mg total) by mouth daily. Can increase to twice a day for 5 days in the event of a recurrence (Patient not taking: Reported on 12/28/2020) 30 tablet 5    No current facility-administered medications for this visit.      Allergies Latex, Metoclopramide, and Sertraline     Physical Exam:  BP (!) 143/95   Pulse 80   Wt 178 lb (80.7 kg)   LMP 12/14/2020 (Approximate)   BMI 31.53 kg/m  Body mass index is 31.53 kg/m. General appearance: Well nourished, well developed female in no acute distress.  Respiratory:  Normal respiratory effort Neuro/Psych:  Normal mood and affect.  Skin:  Warm and dry.    Laboratory: ED negative UPT, u/a, cbc, cmp, lipase   Radiology:  Narrative & Impression  CLINICAL DATA:  Left-sided pelvic pain since yesterday   EXAM: TRANSABDOMINAL AND TRANSVAGINAL ULTRASOUND OF PELVIS   DOPPLER ULTRASOUND OF OVARIES   TECHNIQUE: Both transabdominal and transvaginal ultrasound examinations of the pelvis were performed. Transabdominal technique was performed for global imaging of the pelvis including uterus, ovaries, adnexal regions, and  pelvic cul-de-sac.   It was necessary to proceed with endovaginal exam following the transabdominal exam to visualize the ovaries and adnexa. Color and duplex Doppler ultrasound was utilized to evaluate blood flow to the ovaries.   COMPARISON:  Ultrasound December 31, 2016 and CT November 08, 2020   FINDINGS: Uterus   Measurements: 8.3 x 4.6 x 3.8 cm = volume: 73 mL. No fibroids or other mass visualized.   Endometrium   Thickness: 10 mm.  No focal abnormality visualized.   Right ovary   Measurements: 3.2 x 2.5 x 2.1 cm = volume: 8.8 mL. Thick wall crenulated ovarian cyst with some internal echoes measuring up to 1.9 cm.   Left ovary   Measurements: 2 x 1.6 x 1.5 cm = volume: 3 mL. Normal appearance/no adnexal mass.   Pulsed Doppler evaluation of both ovaries demonstrates normal low-resistance arterial and venous waveforms.   Other findings   Small volume simple appearing free fluid.   IMPRESSION: 1. Negative for adnexal torsion. 2. Thick wall crenulated cyst of the right ovary with some internal echoes, likely representing a collapsing corpus luteal cyst. 3. Small volume simple appearing free fluid in the pelvis, which may represent sequela of cyst rupture but is also within upper limits of physiologic normal.     Electronically Signed   By: Dahlia Bailiff M.D.   On: 12/28/2020 10:27      Assessment: pt stable  Plan:  Options d/w patient in length and she decided on TLH/BSO with cystoscopy, knowing she will need to be on HRT for the next approximately 10 years with risks and limitations associated with HRT.      Durene Romans MD Attending [facility] [employer] farm manager)

## 2021-01-17 NOTE — Pre-Procedure Instructions (Signed)
Alisha Hughes  01/17/2021     Your procedure is scheduled on Tues., Oct. 11, 2022 from 11:45AM-3:15PM.  Report to [facility] Entrance "A" at 9:45AM  Call this number if you have problems the morning of surgery:  [phone]   Remember:  Do not eat or drink after midnight on Oct. 10th    Take these medicines the morning of surgery with A SIP OF WATER: AmLODipine (NORVASC)  If Needed: OxyCODONE-acetaminophen (PERCOCET) Ondansetron (ZOFRAN ODT) ValACYclovir (VALTREX)  As of today, STOP taking all Aspirin (unless instructed by your doctor) and Other Aspirin containing products, Vitamins, Fish oils, and Herbal medications. Also stop all NSAIDS i.e. Advil, Ibuprofen, Motrin, Aleve, Anaprox, Naproxen, BC, Goody Powders, and all Supplements.    No Smoking of any kind, Tobacco/Vaping, or Alcohol products 24 hours prior to your procedure. If you use a Cpap at night, you may bring all equipment for your overnight stay.    Day of Surgery:  Do not wear jewelry, make-up. Do Not wear nail polish, gel polish, artificial nails, or any other type of covering on  natural nails including finger and toenails. If patients have artificial nails, gel coating, etc. that need to be removed by a nail salon please have this removed prior to surgery or surgery may need to be canceled/delayed if the surgeon/ anesthesia feels like the patient is unable to be adequately monitored.  Do not wear lotions, powders, or perfumes/colognes, or deodorant.  Do not shave 48 hours prior to surgery.  Men may shave face and neck.  Do not bring valuables to the hospital.  [facility] is not responsible for any belongings or valuables.  Contacts, dentures or bridgework may not be worn into surgery.    For patients admitted to the hospital, discharge time will be determined by your treatment team.  Patients discharged the day of surgery will not be allowed to drive home, and someone age 39 and over needs to stay  with them for 24 hours.  Oral Hygiene is also important to reduce your risk of infection.  Remember - BRUSH YOUR TEETH THE MORNING OF SURGERY WITH YOUR REGULAR TOOTHPASTE  Special instructions:  [facility]- Preparing For Surgery  Before surgery, you can play an important role. Because skin is not sterile, your skin needs to be as free of germs as possible. You can reduce the number of germs on your skin by washing with CHG (chlorahexidine gluconate) Soap before surgery.  CHG is an antiseptic cleaner which kills germs and bonds with the skin to continue killing germs even after washing.    Please do not use if you have an allergy to CHG or antibacterial soaps. If your skin becomes reddened/irritated stop using the CHG.  Do not shave (including legs and underarms) for at least 48 hours prior to first CHG shower. It is OK to shave your face.  Please follow these instructions carefully.   Shower the NIGHT BEFORE SURGERY and the MORNING OF SURGERY with CHG.   If you chose to wash your hair, wash your hair first as usual with your normal shampoo.  After you shampoo, rinse your hair and body thoroughly to remove the shampoo.  Use CHG as you would any other liquid soap. You can apply CHG directly to the skin and wash gently with a scrungie or a clean washcloth.   Apply the CHG Soap to your body ONLY FROM THE NECK DOWN.  Do not use on open wounds or open  sores. Avoid contact with your eyes, ears, mouth and genitals (private parts). Wash Face and genitals (private parts)  with your normal soap.  Wash thoroughly, paying special attention to the area where your surgery will be performed.  Thoroughly rinse your body with warm water from the neck down.  DO NOT shower/wash with your normal soap after using and rinsing off the CHG Soap.  Pat yourself dry with a CLEAN TOWEL.  Wear CLEAN PAJAMAS to bed the night before surgery, wear comfortable clothes the morning of surgery  Place CLEAN SHEETS on  your bed the night of your first shower and DO NOT SLEEP WITH PETS.  Reminders: Do not apply any deodorants/lotions.  Please wear clean clothes to the hospital/surgery center.   Remember to brush your teeth WITH YOUR REGULAR TOOTHPASTE.  Please read over the following fact sheets that you were given.

## 2021-01-18 ENCOUNTER — Other Ambulatory Visit: Payer: Self-pay

## 2021-01-18 ENCOUNTER — Encounter (HOSPITAL_COMMUNITY)
Admission: RE | Admit: 2021-01-18 | Discharge: 2021-01-18 | Disposition: A | Discharge status: Home / Self Care | Payer: BC Managed Care – PPO | Source: Ambulatory Visit | Attending: Obstetrics and Gynecology | Admitting: Obstetrics and Gynecology

## 2021-01-18 ENCOUNTER — Encounter (HOSPITAL_COMMUNITY): Payer: Self-pay

## 2021-01-18 DIAGNOSIS — Z79899 Other long term (current) drug therapy: Secondary | ICD-10-CM | POA: Diagnosis not present

## 2021-01-18 DIAGNOSIS — I10 Essential (primary) hypertension: Secondary | ICD-10-CM | POA: Diagnosis not present

## 2021-01-18 DIAGNOSIS — N946 Dysmenorrhea, unspecified: Secondary | ICD-10-CM | POA: Insufficient documentation

## 2021-01-18 DIAGNOSIS — Z7901 Long term (current) use of anticoagulants: Secondary | ICD-10-CM | POA: Insufficient documentation

## 2021-01-18 DIAGNOSIS — N83201 Unspecified ovarian cyst, right side: Secondary | ICD-10-CM | POA: Diagnosis not present

## 2021-01-18 DIAGNOSIS — Z01812 Encounter for preprocedural laboratory examination: Secondary | ICD-10-CM | POA: Diagnosis not present

## 2021-01-18 DIAGNOSIS — Z8616 Personal history of COVID-19: Secondary | ICD-10-CM | POA: Diagnosis not present

## 2021-01-18 DIAGNOSIS — N920 Excessive and frequent menstruation with regular cycle: Secondary | ICD-10-CM | POA: Insufficient documentation

## 2021-01-18 DIAGNOSIS — N83202 Unspecified ovarian cyst, left side: Secondary | ICD-10-CM | POA: Insufficient documentation

## 2021-01-18 HISTORY — DX: Personal history of urinary calculi: Z87.442

## 2021-01-18 LAB — TYPE AND SCREEN
ABO/RH(D): A POS
Antibody Screen: NEGATIVE

## 2021-01-18 NOTE — Progress Notes (Addendum)
PCP - Inda Coke, PA Cardiologist - Dr. Ubaldo Glassing in the past, but patient stated does not see on a regular basis;  Was see in 2019 for chest pain.  Per patient, chest pain was not cardiac related but related to bronchitis.  However patient was seen by Dr. Ubaldo Glassing in 06/2020 for chest pain, SOB and tachycardia.  Patient wore a Holter monitor for the tachycardia.  Patient was to have an exercise stress test in April and follow up with Dr. Ubaldo Glassing however patient did not have stress test nor did I see where she followed up with Dr. Ubaldo Glassing.   PPM/ICD - n/a Device Orders -  Rep Notified -   Chest x-ray - 07/11/20 EKG - 07/11/20 Stress Test - 02/10/2018 (NM stress) ECHO - patient denies Cardiac Cath - patient denies  Sleep Study - patient denies CPAP -   Fasting Blood Sugar - n/a Checks Blood Sugar _____ times a day  Blood Thinner Instructions: n/a Aspirin Instructions: n/a  ERAS Protcol - n/a - NPO after midnight PRE-SURGERY Ensure or G2-  none ordered  COVID TEST- patient tested positive for COVID on 11/17/2020 on a home test.  Patient stated she contacted the nurse from her work through text messages with a picture of her positive test and was instructed to quarantine.  Per Pam, if patient can provide a letter from work nurse stating she tested positive we could accept that and patient will not have to be tested prior to surgery.   Anesthesia review: yes, hx of stress test (in Care Everywhere) and elevated BP at PAT appointment.  Patient denies shortness of breath, fever, cough and chest pain at PAT appointment.  Patient's diastolic BP was elevated at PAT appointment.  Patient states she takes BP medications as prescribed however did not take them the morning of PAT appointment.  Patient does have a BP cuff at home.  Instructed patient to monitor BP at home over the next few days, making sure BP is not too high, and to continue taking BP medications.    All instructions explained to the patient,  with a verbal understanding of the material. Patient agrees to go over the instructions while at home for a better understanding. Patient also instructed to self quarantine after being tested for COVID-19. The opportunity to ask questions was provided.

## 2021-01-21 ENCOUNTER — Telehealth: Payer: Self-pay | Admitting: *Deleted

## 2021-01-21 NOTE — Anesthesia Preprocedure Evaluation (Addendum)
Anesthesia Evaluation  Patient identified by MRN, date of birth, ID band Patient awake    Reviewed: Allergy & Precautions, NPO status , Patient's Chart, lab work & pertinent test results  History of Anesthesia Complications Negative for: history of anesthetic complications  Airway Mallampati: I  TM Distance: >3 FB Neck ROM: Full    Dental  (+) Teeth Intact, Dental Advisory Given   Pulmonary neg pulmonary ROS,    Pulmonary exam normal        Cardiovascular hypertension, Normal cardiovascular exam   Normal stress test 07/18/20   Neuro/Psych Anxiety Depression negative neurological ROS     GI/Hepatic negative GI ROS, Neg liver ROS,   Endo/Other  negative endocrine ROS  Renal/GU negative Renal ROS  negative genitourinary   Musculoskeletal negative musculoskeletal ROS (+)   Abdominal   Peds  Hematology negative hematology ROS (+)   Anesthesia Other Findings   Reproductive/Obstetrics                          Anesthesia Physical Anesthesia Plan  ASA: 2  Anesthesia Plan: General   Post-op Pain Management:    Induction: Intravenous  PONV Risk Score and Plan: 4 or greater and Ondansetron, Dexamethasone, Treatment may vary due to age or medical condition, Midazolam and Scopolamine patch - Pre-op  Airway Management Planned: Oral ETT  Additional Equipment: None  Intra-op Plan:   Post-operative Plan: Extubation in OR  Informed Consent: I have reviewed the patients History and Physical, chart, labs and discussed the procedure including the risks, benefits and alternatives for the proposed anesthesia with the patient or authorized representative who has indicated his/her understanding and acceptance.     Dental advisory given  Plan Discussed with:   Anesthesia Plan Comments: (See APP note by Durel Salts, FNP )       Anesthesia Quick Evaluation

## 2021-01-21 NOTE — Progress Notes (Signed)
Pt made aware of surgery time change to 0730-1100, arrival 0530.

## 2021-01-21 NOTE — Telephone Encounter (Signed)
Call to patient- advised of time change to 0930 for surgery and arrive at 0730 tomorrow.  Patient agreeable, Encounter closed.

## 2021-01-21 NOTE — Progress Notes (Signed)
Anesthesia Chart Review:   Case: [id number] Date/Time: 01/22/21 1130   Procedures:      TOTAL LAPAROSCOPIC HYSTERECTOMY WITH BILATERAL SALPINGO OOPHORECTOMY (Bilateral)     CYSTOSCOPY possible   Anesthesia type: Choice   Pre-op diagnosis: Dysmennorrhea, menorrhagia, ovarian cyst   [facility] / [facility]   Surgeons: Aletha Halim, MD       DISCUSSION: Pt is 40 years old with hx HTN, anxiety & depression  - Cardiologist is Bartholome Bill, MD. Last office visit 07/12/20 (notes in care everywhere) for chest tightness, SOB, and irregular heartbeat. Note documents symptoms can happen with exertion or at rest. Echo, stress test, and cardiac monitor ordered but echo and event monitor not completed.  - I spoke with pt.  She had symptoms in March 2022, and reports she had the cardiac event monitor test (she did not, she had stress test) and then she cancelled her other tests as her symptoms completely resolved prior to scheduled test dates and symptoms never recurred.  Pt had covid at the time of symptoms and she believes covid caused her symptoms and when she recovered from covid, symptoms stopped.  - Records from Dr. Bethanne Ginger office show pt had exercise stress test 07/18/20 that was normal. Pt did not wear/complete cardiac event monitor test or have echo.  - Pt reports she has had no chest pain, no SOB, and no irregular heartbeat since March 2022 (prior to normal stress test 07/18/20)   Tested positive for COVID at home 11/17/20. She texted a picture of her positive test to the nurse at her work; she will bring a letter from the nurse documenting her positive test with her the morning of surgery  BP elevated at pre-admission testing at 154/105 however, pt did not take BP meds morning of PAT. RN instructed pt to take BP meds as prescribed. Pt reports she has been doing so; checking BP at home, reports DBP all <100.     - Reviewed case with Dr. Lanetta Inch   VS: BP (!) 154/105   Pulse 73   Temp 36.8  C (Oral)   Resp 19   Ht 5\' 3"  (1.6 m)   Wt 81.5 kg   SpO2 100%   BMI 31.83 kg/m   PROVIDERS: - PCP is Inda Coke, [place] - Cardiologist is Bartholome Bill, MD. Last office visit 07/12/20 (notes in care everywhere)    LABS:  - UA, CBC, CMP 12/28/20 acceptable for surgery  (all labs ordered are listed, but only abnormal results are displayed)  Labs Reviewed  TYPE AND SCREEN     IMAGES: CXR 07/11/20: No acute cardiopulmonary disease   EKG 07/11/20: sinus rhythm   CV: Nuclear stress test 07/18/20:  - Normal stress test   Past Medical History:  Diagnosis Date   Abnormal ECG 02/03/2018   Anxiety and depression    History of kidney stones    HSV (herpes simplex virus) infection 02/10/2018   Hyperlipidemia    Hypertension     Past Surgical History:  Procedure Laterality Date   CESAREAN SECTION     CESAREAN SECTION N/A 12/19/2012   Procedure: Repeat cesarean section with delivery of baby girl at [place].    Bilateral tubal ligation with filshie clips.;  Surgeon: Donnamae Jude, MD;  Location: [facility];  Service: Obstetrics;  Laterality: N/A;   CHOLECYSTECTOMY  2010   COLONOSCOPY     2009   COLPOSCOPY VULVA W/ BIOPSY     abnormal pap  ENDOMETRIAL ABLATION N/A 07/17/2015   Procedure: ENDOMETRIAL ABLATION WITH HTA, DILATATION AND CURRATAGE;  Surgeon: Emily Filbert, MD;  Location: [facility];  Service: Gynecology;  Laterality: N/A;   KNEE ARTHROSCOPY     LAPAROSCOPIC LYSIS OF ADHESIONS  07/17/2015   Procedure: LAPAROSCOPIC LYSIS OF ADHESIONS;  Surgeon: Emily Filbert, MD;  Location: [facility];  Service: Gynecology;;   LAPAROSCOPY N/A 07/17/2015   Procedure: LAPAROSCOPY DIAGNOSTIC;  Surgeon: Emily Filbert, MD;  Location: [facility];  Service: Gynecology;  Laterality: N/A;   TUBAL LIGATION     UPPER GI ENDOSCOPY     WISDOM TOOTH EXTRACTION      MEDICATIONS:  amLODipine (NORVASC) 10 MG tablet   cephALEXin (KEFLEX) 500 MG capsule   fluticasone (FLONASE) 50 MCG/ACT nasal spray   ibuprofen (ADVIL) 600  MG tablet   lisinopril-hydrochlorothiazide (ZESTORETIC) 20-25 MG tablet   nitrofurantoin, macrocrystal-monohydrate, (MACROBID) 100 MG capsule   ondansetron (ZOFRAN ODT) 4 MG disintegrating tablet   oxyCODONE-acetaminophen (PERCOCET) 5-325 MG tablet   tamsulosin (FLOMAX) 0.4 MG CAPS capsule   valACYclovir (VALTREX) 500 MG tablet   No current facility-administered medications for this encounter.    If no changes, I anticipate pt can proceed with surgery as scheduled.    Willeen Cass, PhD, FNP-BC [facility] Short Stay Surgical Center/Anesthesiology Phone: [phone] 01/21/2021 11:04 AM

## 2021-01-22 ENCOUNTER — Observation Stay (HOSPITAL_COMMUNITY)
Admission: RE | Admit: 2021-01-22 | Discharge: 2021-01-22 | Disposition: A | Discharge status: Home / Self Care | Payer: BC Managed Care – PPO | Source: Ambulatory Visit | Attending: Obstetrics and Gynecology | Admitting: Obstetrics and Gynecology

## 2021-01-22 ENCOUNTER — Other Ambulatory Visit: Payer: Self-pay

## 2021-01-22 ENCOUNTER — Observation Stay (HOSPITAL_COMMUNITY): Payer: BC Managed Care – PPO | Admitting: Anesthesiology

## 2021-01-22 ENCOUNTER — Observation Stay (HOSPITAL_COMMUNITY): Payer: BC Managed Care – PPO | Admitting: Emergency Medicine

## 2021-01-22 ENCOUNTER — Encounter (HOSPITAL_COMMUNITY)
Admission: RE | Disposition: A | Discharge status: Home / Self Care | Payer: Self-pay | Source: Ambulatory Visit | Attending: Obstetrics and Gynecology

## 2021-01-22 ENCOUNTER — Encounter (HOSPITAL_COMMUNITY): Payer: Self-pay | Admitting: Obstetrics and Gynecology

## 2021-01-22 DIAGNOSIS — N8301 Follicular cyst of right ovary: Secondary | ICD-10-CM | POA: Diagnosis not present

## 2021-01-22 DIAGNOSIS — N944 Primary dysmenorrhea: Secondary | ICD-10-CM | POA: Diagnosis not present

## 2021-01-22 DIAGNOSIS — Z90710 Acquired absence of both cervix and uterus: Secondary | ICD-10-CM

## 2021-01-22 DIAGNOSIS — I10 Essential (primary) hypertension: Secondary | ICD-10-CM | POA: Insufficient documentation

## 2021-01-22 DIAGNOSIS — N83201 Unspecified ovarian cyst, right side: Secondary | ICD-10-CM | POA: Diagnosis not present

## 2021-01-22 DIAGNOSIS — N8302 Follicular cyst of left ovary: Secondary | ICD-10-CM | POA: Diagnosis not present

## 2021-01-22 DIAGNOSIS — N83291 Other ovarian cyst, right side: Principal | ICD-10-CM | POA: Insufficient documentation

## 2021-01-22 DIAGNOSIS — N920 Excessive and frequent menstruation with regular cycle: Secondary | ICD-10-CM | POA: Diagnosis not present

## 2021-01-22 DIAGNOSIS — N946 Dysmenorrhea, unspecified: Secondary | ICD-10-CM | POA: Diagnosis not present

## 2021-01-22 DIAGNOSIS — N925 Other specified irregular menstruation: Secondary | ICD-10-CM | POA: Diagnosis not present

## 2021-01-22 DIAGNOSIS — E785 Hyperlipidemia, unspecified: Secondary | ICD-10-CM | POA: Diagnosis not present

## 2021-01-22 DIAGNOSIS — E559 Vitamin D deficiency, unspecified: Secondary | ICD-10-CM | POA: Diagnosis not present

## 2021-01-22 HISTORY — PX: CYSTOSCOPY: SHX5120

## 2021-01-22 HISTORY — PX: TOTAL LAPAROSCOPIC HYSTERECTOMY WITH BILATERAL SALPINGO OOPHORECTOMY: SHX6845

## 2021-01-22 LAB — POCT PREGNANCY, URINE: Preg Test, Ur: NEGATIVE

## 2021-01-22 SURGERY — HYSTERECTOMY, TOTAL, LAPAROSCOPIC, WITH BILATERAL SALPINGO-OOPHORECTOMY
Anesthesia: General

## 2021-01-22 MED ORDER — FENTANYL CITRATE (PF) 100 MCG/2ML IJ SOLN
INTRAMUSCULAR | Status: AC
  Filled 2021-01-22: qty 2

## 2021-01-22 MED ORDER — SODIUM CHLORIDE 0.9 % IR SOLN
Status: DC | PRN
  Administered 2021-01-22: 1000 mL

## 2021-01-22 MED ORDER — HYDROMORPHONE HCL 1 MG/ML IJ SOLN: 0.5000 mg | INTRAMUSCULAR | Status: DC | PRN

## 2021-01-22 MED ORDER — PHENYLEPHRINE HCL (PRESSORS) 10 MG/ML IV SOLN
INTRAVENOUS | Status: DC | PRN
  Administered 2021-01-22: 100 ug via INTRAVENOUS

## 2021-01-22 MED ORDER — KETOROLAC TROMETHAMINE 30 MG/ML IJ SOLN
INTRAMUSCULAR | Status: DC | PRN
  Administered 2021-01-22: 30 mg via INTRAVENOUS

## 2021-01-22 MED ORDER — AMISULPRIDE (ANTIEMETIC) 5 MG/2ML IV SOLN: 10.0000 mg | Freq: Once | INTRAVENOUS | Status: DC | PRN

## 2021-01-22 MED ORDER — DEXMEDETOMIDINE (PRECEDEX) IN NS 20 MCG/5ML (4 MCG/ML) IV SYRINGE
PREFILLED_SYRINGE | INTRAVENOUS | Status: DC | PRN
  Administered 2021-01-22: 8 ug via INTRAVENOUS

## 2021-01-22 MED ORDER — ACETAMINOPHEN 10 MG/ML IV SOLN
INTRAVENOUS | Status: AC
  Filled 2021-01-22: qty 100

## 2021-01-22 MED ORDER — ROCURONIUM BROMIDE 10 MG/ML (PF) SYRINGE
PREFILLED_SYRINGE | INTRAVENOUS | Status: DC | PRN
  Administered 2021-01-22: 60 mg via INTRAVENOUS
  Administered 2021-01-22: 10 mg via INTRAVENOUS
  Administered 2021-01-22 (×2): 20 mg via INTRAVENOUS

## 2021-01-22 MED ORDER — ONDANSETRON HCL 4 MG/2ML IJ SOLN
4.0000 mg | Freq: Once | INTRAMUSCULAR | Status: AC | PRN
  Administered 2021-01-22: 4 mg via INTRAVENOUS

## 2021-01-22 MED ORDER — FENTANYL CITRATE (PF) 100 MCG/2ML IJ SOLN
25.0000 ug | INTRAMUSCULAR | Status: DC | PRN
  Administered 2021-01-22 (×2): 50 ug via INTRAVENOUS
  Administered 2021-01-22 (×2): 25 ug via INTRAVENOUS

## 2021-01-22 MED ORDER — FENTANYL CITRATE (PF) 100 MCG/2ML IJ SOLN
INTRAMUSCULAR | Status: DC | PRN
  Administered 2021-01-22: 25 ug via INTRAVENOUS
  Administered 2021-01-22: 50 ug via INTRAVENOUS
  Administered 2021-01-22: 25 ug via INTRAVENOUS
  Administered 2021-01-22: 100 ug via INTRAVENOUS
  Administered 2021-01-22 (×2): 25 ug via INTRAVENOUS

## 2021-01-22 MED ORDER — ORAL CARE MOUTH RINSE: 15.0000 mL | Freq: Once | OROMUCOSAL | Status: AC

## 2021-01-22 MED ORDER — DEXAMETHASONE SODIUM PHOSPHATE 10 MG/ML IJ SOLN
INTRAMUSCULAR | Status: DC | PRN
  Administered 2021-01-22: 10 mg via INTRAVENOUS

## 2021-01-22 MED ORDER — BUPIVACAINE HCL (PF) 0.5 % IJ SOLN
INTRAMUSCULAR | Status: AC
  Filled 2021-01-22: qty 30

## 2021-01-22 MED ORDER — ACETAMINOPHEN 10 MG/ML IV SOLN
1000.0000 mg | Freq: Once | INTRAVENOUS | Status: AC
  Administered 2021-01-22: 1000 mg via INTRAVENOUS

## 2021-01-22 MED ORDER — KETOROLAC TROMETHAMINE 30 MG/ML IJ SOLN
INTRAMUSCULAR | Status: AC
  Filled 2021-01-22: qty 1

## 2021-01-22 MED ORDER — LIDOCAINE 2% (20 MG/ML) 5 ML SYRINGE
INTRAMUSCULAR | Status: DC | PRN
  Administered 2021-01-22: 100 mg via INTRAVENOUS

## 2021-01-22 MED ORDER — CHLORHEXIDINE GLUCONATE 0.12 % MT SOLN: 15.0000 mL | Freq: Once | OROMUCOSAL | Status: AC

## 2021-01-22 MED ORDER — OXYCODONE-ACETAMINOPHEN 5-325 MG PO TABS: 1.0000 | ORAL_TABLET | Freq: Four times a day (QID) | ORAL | 0 refills | Status: DC | PRN

## 2021-01-22 MED ORDER — MIDAZOLAM HCL 5 MG/5ML IJ SOLN
INTRAMUSCULAR | Status: DC | PRN
Start: 2021-01-22 — End: 2021-01-22
  Administered 2021-01-22: 2 mg via INTRAVENOUS

## 2021-01-22 MED ORDER — CEFAZOLIN SODIUM-DEXTROSE 2-4 GM/100ML-% IV SOLN
2.0000 g | INTRAVENOUS | Status: AC
  Administered 2021-01-22: 2 g via INTRAVENOUS

## 2021-01-22 MED ORDER — OXYCODONE HCL 5 MG PO TABS
ORAL_TABLET | ORAL | Status: AC
  Filled 2021-01-22: qty 1

## 2021-01-22 MED ORDER — OXYCODONE HCL 5 MG/5ML PO SOLN: 5.0000 mg | Freq: Once | ORAL | Status: AC | PRN

## 2021-01-22 MED ORDER — SUGAMMADEX SODIUM 200 MG/2ML IV SOLN
INTRAVENOUS | Status: DC | PRN
  Administered 2021-01-22: 200 mg via INTRAVENOUS

## 2021-01-22 MED ORDER — SCOPOLAMINE 1 MG/3DAYS TD PT72: 1.0000 | MEDICATED_PATCH | TRANSDERMAL | Status: DC

## 2021-01-22 MED ORDER — PROPOFOL 10 MG/ML IV BOLUS
INTRAVENOUS | Status: DC | PRN
  Administered 2021-01-22: 200 mg via INTRAVENOUS

## 2021-01-22 MED ORDER — LIDOCAINE 2% (20 MG/ML) 5 ML SYRINGE
INTRAMUSCULAR | Status: AC
  Filled 2021-01-22: qty 5

## 2021-01-22 MED ORDER — SIMETHICONE 80 MG PO CHEW: 80.0000 mg | CHEWABLE_TABLET | Freq: Four times a day (QID) | ORAL | 0 refills | Status: DC | PRN

## 2021-01-22 MED ORDER — FENTANYL CITRATE (PF) 250 MCG/5ML IJ SOLN
INTRAMUSCULAR | Status: AC
  Filled 2021-01-22: qty 5

## 2021-01-22 MED ORDER — DEXAMETHASONE SODIUM PHOSPHATE 10 MG/ML IJ SOLN
INTRAMUSCULAR | Status: AC
  Filled 2021-01-22: qty 1

## 2021-01-22 MED ORDER — DOCUSATE SODIUM 100 MG PO CAPS: 100.0000 mg | ORAL_CAPSULE | Freq: Two times a day (BID) | ORAL | 1 refills | Status: AC

## 2021-01-22 MED ORDER — LACTATED RINGERS IV SOLN: INTRAVENOUS | Status: DC

## 2021-01-22 MED ORDER — BUPIVACAINE HCL 0.5 % IJ SOLN
INTRAMUSCULAR | Status: DC | PRN
  Administered 2021-01-22: 12 mL
  Administered 2021-01-22: 18 mL

## 2021-01-22 MED ORDER — PROPOFOL 10 MG/ML IV BOLUS
INTRAVENOUS | Status: AC
  Filled 2021-01-22: qty 20

## 2021-01-22 MED ORDER — OXYCODONE HCL 5 MG PO TABS
5.0000 mg | ORAL_TABLET | Freq: Once | ORAL | Status: AC | PRN
  Administered 2021-01-22: 5 mg via ORAL

## 2021-01-22 MED ORDER — ROCURONIUM BROMIDE 10 MG/ML (PF) SYRINGE
PREFILLED_SYRINGE | INTRAVENOUS | Status: AC
  Filled 2021-01-22: qty 10

## 2021-01-22 MED ORDER — ESTRADIOL 0.075 MG/24HR TD PTTW: 1.0000 | MEDICATED_PATCH | TRANSDERMAL | 12 refills | Status: DC

## 2021-01-22 MED ORDER — CHLORHEXIDINE GLUCONATE 0.12 % MT SOLN
OROMUCOSAL | Status: AC
  Administered 2021-01-22: 15 mL via OROMUCOSAL
  Filled 2021-01-22: qty 15

## 2021-01-22 MED ORDER — PHENYLEPHRINE 40 MCG/ML (10ML) SYRINGE FOR IV PUSH (FOR BLOOD PRESSURE SUPPORT)
PREFILLED_SYRINGE | INTRAVENOUS | Status: AC
  Filled 2021-01-22: qty 10

## 2021-01-22 MED ORDER — MIDAZOLAM HCL 2 MG/2ML IJ SOLN
INTRAMUSCULAR | Status: AC
  Filled 2021-01-22: qty 2

## 2021-01-22 MED ORDER — CEFAZOLIN SODIUM-DEXTROSE 2-4 GM/100ML-% IV SOLN
INTRAVENOUS | Status: AC
  Filled 2021-01-22: qty 100

## 2021-01-22 MED ORDER — DEXMEDETOMIDINE (PRECEDEX) IN NS 20 MCG/5ML (4 MCG/ML) IV SYRINGE
PREFILLED_SYRINGE | INTRAVENOUS | Status: AC
  Filled 2021-01-22: qty 5

## 2021-01-22 MED ORDER — ONDANSETRON HCL 4 MG/2ML IJ SOLN
INTRAMUSCULAR | Status: AC
  Filled 2021-01-22: qty 2

## 2021-01-22 SURGICAL SUPPLY — 58 items
APPLICATOR ARISTA FLEXITIP XL (MISCELLANEOUS) IMPLANT
BARRIER ADHS 3X4 INTERCEED (GAUZE/BANDAGES/DRESSINGS) IMPLANT
BLADE SURG 15 STRL LF DISP TIS (BLADE) ×2 IMPLANT
BLADE SURG 15 STRL SS (BLADE) ×1
CABLE HIGH FREQUENCY MONO STRZ (ELECTRODE) ×3 IMPLANT
DECANTER SPIKE VIAL GLASS SM (MISCELLANEOUS) ×3 IMPLANT
DEFOGGER SCOPE WARMER CLEARIFY (MISCELLANEOUS) ×3 IMPLANT
DERMABOND ADVANCED (GAUZE/BANDAGES/DRESSINGS) ×1
DERMABOND ADVANCED .7 DNX12 (GAUZE/BANDAGES/DRESSINGS) ×2 IMPLANT
DEVICE SUTURE ENDOST 10MM (ENDOMECHANICALS) ×3 IMPLANT
DRAPE BACK TABLE (DRAPES) ×3 IMPLANT
DRSG COVADERM PLUS 2X2 (GAUZE/BANDAGES/DRESSINGS) ×12 IMPLANT
DURAPREP 26ML APPLICATOR (WOUND CARE) ×3 IMPLANT
GLOVE SURG POLYISO LF SZ7 (GLOVE) ×6 IMPLANT
GLOVE SURG UNDER POLY LF SZ7 (GLOVE) ×6 IMPLANT
GLOVE SURG UNDER POLY LF SZ7.5 (GLOVE) ×6 IMPLANT
GOWN STRL REUS W/ TWL XL LVL3 (GOWN DISPOSABLE) ×4 IMPLANT
GOWN STRL REUS W/TWL XL LVL3 (GOWN DISPOSABLE) ×2
GRASPER SUT TROCAR 14GX15 (MISCELLANEOUS) ×3 IMPLANT
HEMOSTAT ARISTA ABSORB 3G PWDR (HEMOSTASIS) IMPLANT
HIBICLENS CHG 4% 4OZ BTL (MISCELLANEOUS) ×3 IMPLANT
KIT TURNOVER KIT B (KITS) ×3 IMPLANT
L-HOOK LAP DISP 36CM (ELECTROSURGICAL) ×3
LHOOK LAP DISP 36CM (ELECTROSURGICAL) ×2 IMPLANT
LIGASURE VESSEL 5MM BLUNT TIP (ELECTROSURGICAL) IMPLANT
MANIFOLD NEPTUNE II (INSTRUMENTS) ×3 IMPLANT
MANIPULATOR VCARE LG CRV RETR (MISCELLANEOUS) IMPLANT
MANIPULATOR VCARE SML CRV RETR (MISCELLANEOUS) IMPLANT
MANIPULATOR VCARE STD CRV RETR (MISCELLANEOUS) ×3 IMPLANT
OCCLUDER COLPOPNEUMO (BALLOONS) ×3 IMPLANT
PACK LAPAROSCOPY BASIN (CUSTOM PROCEDURE TRAY) ×3 IMPLANT
PACK TRENDGUARD 450 HYBRID PRO (MISCELLANEOUS) ×2 IMPLANT
PAD ARMBOARD 7.5X6 YLW CONV (MISCELLANEOUS) ×6 IMPLANT
PAD OB MATERNITY 4.3X12.25 (PERSONAL CARE ITEMS) ×3 IMPLANT
POUCH LAPAROSCOPIC INSTRUMENT (MISCELLANEOUS) ×3 IMPLANT
SCISSORS LAP 5X35 DISP (ENDOMECHANICALS) IMPLANT
SET IRRIG TUBING LAPAROSCOPIC (IRRIGATION / IRRIGATOR) ×3 IMPLANT
SET IRRIG Y TYPE TUR BLADDER L (SET/KITS/TRAYS/PACK) ×3 IMPLANT
SET TUBE SMOKE EVAC HIGH FLOW (TUBING) ×3 IMPLANT
SOLUTION ELECTROLUBE (MISCELLANEOUS) IMPLANT
SUT ENDO VLOC 180-0-8IN (SUTURE) ×3 IMPLANT
SUT MNCRL AB 4-0 PS2 18 (SUTURE) ×3 IMPLANT
SUT VIC AB 0 CT1 36 (SUTURE) ×18 IMPLANT
SUT VIC AB 4-0 PS2 27 (SUTURE) ×3 IMPLANT
SUT VICRYL 0 UR6 27IN ABS (SUTURE) ×6 IMPLANT
SUT VLOC 180 0 24IN GS25 (SUTURE) IMPLANT
SUT VLOC 180 0 9IN  GS21 (SUTURE) ×1
SUT VLOC 180 0 9IN GS21 (SUTURE) ×2 IMPLANT
SYR 10ML LL (SYRINGE) ×3 IMPLANT
SYR 50ML LL SCALE MARK (SYRINGE) ×3 IMPLANT
SYSTEM CARTER THOMASON II (TROCAR) IMPLANT
TOWEL GREEN STERILE FF (TOWEL DISPOSABLE) ×3 IMPLANT
TRAY FOLEY W/BAG SLVR 14FR LF (SET/KITS/TRAYS/PACK) ×3 IMPLANT
TRENDGUARD 450 HYBRID PRO PACK (MISCELLANEOUS) ×3
TROCAR ADV FIXATION 5X100MM (TROCAR) ×6 IMPLANT
TROCAR BALLN 12MMX100 BLUNT (TROCAR) ×3 IMPLANT
TROCAR XCEL NON-BLD 11X100MML (ENDOMECHANICALS) ×3 IMPLANT
UNDERPAD 30X36 HEAVY ABSORB (UNDERPADS AND DIAPERS) ×3 IMPLANT

## 2021-01-22 NOTE — Discharge Instructions (Addendum)
Laparoscopic Surgery Discharge Instructions  Instructions Following Major Laparoscopic Surgery You have just undergone a major laparoscopic surgery.  The following list should answer your most common questions.  Although we will discuss your surgery and post-operative instructions with you prior to your discharge, this list will serve as a reminder if you fail to recall the details of what we discussed.  We will discuss your surgery once again in detail at your post-op visit in two to four weeks. If you haven't already done so, please call to make your appointment as soon as possible.  How you will feel: Although you have just undergone a major surgery, your recovery will be significantly shorter since the surgery was performed through much smaller incisions than the traditional approach.  You should feel slightly better each day.  If you suddenly feel much worse than the prior day, please call the clinic.  It's important during the early part of your recovery that you maintain some activity.  Walking is encouraged.  You will quicken your recovery by continued activity.  Incision:  Your incisions will be closed with dissolvable stitches or surgical adhesive (glue).  There may be Band-aids and/or Steri-strips covering your incisions.  If there is no drainage from the incisions you may remove the Band-aids in one to two days.  You may notice some minor bruising at the incision sites.  This is common and will resolve within several days.  Please inform us if the redness at the edges of your incision appears to be spreading.  If the skin around your incision becomes warm to the touch, or if you notice a pus-like drainage, please call the office.  Vaginal Discharge Following a Laparoscopic Hysterectomy: Minor vaginal bleeding or spotting is normal following a hysterectomy.  Bleeding similar to the amount of your period is excessive, and you should inform us of this immediately.  Vaginal spotting may continue  for several weeks following your surgery.  You may notice a yellowish discharge which occasionally occurs as the vaginal stitches dissolve, and may last for several weeks.  Sexual Activity Following a Hysterectomy: Do not have sexual intercourse or place tampons or douches in the vagina prior to your first office visit.  We will discuss when you may resume these activities at that visit.    Stairs/Driving/Activities: You may climb stairs if necessary.  If you've had general anesthesia, do not drive a car the rest of the day today.  You may begin light housework when you feel up to it, but avoid heavy lifting (more than 15-20lbs) or pushing until cleared for these activities by your physician.  Hygiene:  Do not soak your incisions.  Showers are acceptable but you may not take a bath or swim in a pool.  Cleanse your incisions daily with soap and water.  Medications:  Please resume taking any medications that you were taking prior to the surgery.  If we have prescribed any new medications for you, please take them as directed.  Constipation:  It is fairly common to experience some difficulty in moving your bowels following major surgery.  Being active will help to reduce this likelihood. A diet rich in fiber and plenty of liquids is desirable.  If you do become constipated, a mild laxative such as Miralax, Milk of Magnesia, or Metamucil, or a stool softener such as Colace, is recommended.  General Instructions: If you develop a fever of 100.5 degrees or higher, please call the office number(s) below for physician on call.   

## 2021-01-22 NOTE — Brief Op Note (Signed)
01/22/2021  10:52 AM  PATIENT:  Walden Field  40 y.o. female  PRE-OPERATIVE DIAGNOSIS:  Dysmennorrhea, menorrhagia, ovarian cyst  POST-OPERATIVE DIAGNOSIS:  Dysmennorrhea, menorrhagia, ovarian cyst  PROCEDURE:  TLH/BSO/cystoscopy  SURGEON:  Surgeon(s) and Role:    * Aletha Halim, MD - Primary  ASSISTANTS:    * Chancy Milroy, MD - Assisting   ANESTHESIA:   local and general  EBL:  100 mL   BLOOD ADMINISTERED:none  DRAINS:  indwelling foley    LOCAL MEDICATIONS USED:  LIDOCAINE   SPECIMEN:  cervix, uterus, both tubes and ovaries  DISPOSITION OF SPECIMEN:  PATHOLOGY  COUNTS:  YES  TOURNIQUET:  * No tourniquets in log *  DICTATION: .Note written in EPIC  PLAN OF CARE: Discharge to home after PACU  PATIENT DISPOSITION:  PACU - hemodynamically stable.   Delay start of Pharmacological VTE agent (>24hrs) due to surgical blood loss or risk of bleeding: not applicable  Durene Romans MD Attending Center for [place] (Faculty Practice) 01/22/2021 Time: [phone]

## 2021-01-22 NOTE — Anesthesia Postprocedure Evaluation (Signed)
Anesthesia Post Note  Patient: Alisha Hughes  Procedure(s) Performed: TOTAL LAPAROSCOPIC HYSTERECTOMY WITH BILATERAL SALPINGO OOPHORECTOMY, OPEN VAGINAL  REPAIR OF VAGINAL CUFF (Bilateral) CYSTOSCOPY     Patient location during evaluation: PACU Anesthesia Type: General Level of consciousness: awake and alert, oriented and patient cooperative Pain management: pain level controlled Vital Signs Assessment: post-procedure vital signs reviewed and stable Respiratory status: spontaneous breathing, nonlabored ventilation and respiratory function stable Cardiovascular status: blood pressure returned to baseline and stable Postop Assessment: no apparent nausea or vomiting Anesthetic complications: no   No notable events documented.  Last Vitals:  Vitals:   01/22/21 1115 01/22/21 1130  BP: 116/72 103/64  Pulse: 81 73  Resp: 20 17  Temp:  37.1 C  SpO2: 96% 97%    Last Pain:   Pain Goal:                   Pervis Hocking

## 2021-01-22 NOTE — Interval H&P Note (Signed)
History and Physical Interval Note:  01/22/2021 7:04 AM  Walden Field  has presented today for surgery, with the diagnosis of Dysmennorrhea, menorrhagia, ovarian cyst.  The various methods of treatment have been discussed with the patient and family. After consideration of risks, benefits and other options for treatment, the patient has consented to  Procedure(s): TOTAL LAPAROSCOPIC HYSTERECTOMY WITH BILATERAL SALPINGO OOPHORECTOMY (Bilateral) CYSTOSCOPY possible (N/A) as a surgical intervention.  The patient's history has been reviewed, patient examined, no change in status, stable for surgery.  I have reviewed the patient's chart and labs.  Questions were answered to the patient's satisfaction.    Plan for discharge to home from the PACU and HRT TD patch placement there, as well.    Aletha Halim

## 2021-01-22 NOTE — Anesthesia Procedure Notes (Signed)
Procedure Name: Intubation Date/Time: 01/22/2021 7:38 AM Performed by: Vonna Drafts, CRNA Pre-anesthesia Checklist: Patient identified, Emergency Drugs available, Suction available and Patient being monitored Patient Re-evaluated:Patient Re-evaluated prior to induction Oxygen Delivery Method: Circle system utilized Preoxygenation: Pre-oxygenation with 100% oxygen Induction Type: IV induction Ventilation: Mask ventilation without difficulty Laryngoscope Size: Mac and 3 Grade View: Grade I Tube type: Oral Tube size: 7.0 mm Number of attempts: 1 Airway Equipment and Method: Stylet and Oral airway Placement Confirmation: ETT inserted through vocal cords under direct vision, positive ETCO2 and breath sounds checked- equal and bilateral Secured at: 20 cm Tube secured with: Tape Dental Injury: Teeth and Oropharynx as per pre-operative assessment

## 2021-01-22 NOTE — Op Note (Signed)
Operative Note   01/22/2021  PRE-OP DIAGNOSIS *Dysmennorrhea, menorrhagia, recurrent ovarian cysts *History of endometrial ablation *Desire for definitive surgical therapy   POST-OP DIAGNOSIS *Same   SURGEON: Surgeon(s) and Role:    * Aletha Halim, MD - Primary  ASSISTANT:    Chancy Milroy, MD - Assisting  An experienced assistant was required given the standard of surgical care given the complexity of the case. This assistant was needed for exposure, dissection, suctioning, retraction, instrument exchange, and for overall help during the procedure.   PROCEDURE: Total laparoscopic hysterectomy, bilateral salpingoophorectomy, cystoscopy  ANESTHESIA: General and local  ESTIMATED BLOOD LOSS:  171mL  DRAINS: indweling foley 166mL UOP   TOTAL IV FLUIDS: per anesthesia note  VTE PROPHYLAXIS: SCDs to the bilateral lower extremities  ANTIBIOTICS: Two grams of Cefazolin were given, within 1 hour of skin incision  SPECIMENS: cervix, uterus, both tubes and ovaries  DISPOSITION: PACU - hemodynamically stable.  CONDITION: stable  COMPLICATIONS: v-lock endostitch was not availabe so tray opened for closure of vaginal cuff  FINDINGS: No intraabdominal adhesions. Grossly normal appearing cervix, uterus, ovaries, and fallopian tubes with filshie clips across the mid section; normal liver and stomach edge.  On cystoscopy, two ureteral orifices noted on the right with +efflux of urine from both orifices, one ureteral orifice on the left with +efflux of urine, normal bladder dome.   DESCRIPTION OF PROCEDURE: After informed consent was obtained, the patient was taken to the operating room where anesthesia was obtained without difficulty. The patient was positioned in the dorsal lithotomy position in [place] and her arms were carefully tucked at her sides and the usual precautions were taken.  She was prepped and draped in normal sterile fashion.  Time-out was performed and a  Foley catheter was placed into the bladder. A standard VCare uterine manipulator was then placed in the uterus without incident. Gloves were then changed, and after injection of local anesthesia, the open technique was used to place an infraumbilical 09-GG baloon trocar under direct visualization. The laparoscope was introduced and CO2 gas was infused for pneumoperitoneum to a pressure of 15 mm Hg and the area below inspected for injury.  The patient was placed in Trendelenburg and the bowel was displaced up into the upper abdomen, and the right and left lateral 5-mm ports and an 11 mm suprapubic port were placed under direct visualization of the laparoscope, after injection of local anesthesia.    The bilateral round ligaments were open and broad ligament leaves opened up and the IP ligament skeletonized and isolated and the ureters noted to be well away from the IP ligaments.  The IP ligaments were serially cauterized and cut and the bladder flap created with the monopolar scissors the Ligasure and the bilateral uterine arteries were skeletonized.  They were then serially cauterized and transected with the Ligasure.  A colpotomy was performed circumferentially along the ring with electrocautery and the cervix was incised from the vagina and the specimen was removed through the vagina.  The vaginal cuff was then closed with interuppted sutures of 0 vicryl and figure of eight stitches from below.  Gloves were then changed, and the laparoscope was used to inspect all operative areas and they were noted to be hemostatic. The suprapubic port was removed and the fascia closed with a 0 vicryl stitch and the endoclose device.  The cystoscopy was then performed, with the above noted findings.   The umbilical fascia was closed with a figure of eight 0  vicryl. The skin incisions were then closed with 4-0 vicryl and monocryl in a subcuticular fashion. The patient tolerated the procedure well.  Sponge, lap and needle counts  were correct x2.  The patient was taken to recovery room in excellent condition.  Durene Romans MD Attending Center for [facility]

## 2021-01-22 NOTE — Transfer of Care (Signed)
Immediate Anesthesia Transfer of Care Note  Patient: Alisha Hughes  Procedure(s) Performed: TOTAL LAPAROSCOPIC HYSTERECTOMY WITH BILATERAL SALPINGO OOPHORECTOMY, OPEN VAGINAL  REPAIR OF VAGINAL CUFF (Bilateral) CYSTOSCOPY  Patient Location: PACU  Anesthesia Type:General  Level of Consciousness: drowsy  Airway & Oxygen Therapy: Patient Spontanous Breathing  Post-op Assessment: Report given to RN and Post -op Vital signs reviewed and stable  Post vital signs: Reviewed and stable  Last Vitals:  Vitals Value Taken Time  BP 122/77 01/22/21 1044  Temp    Pulse 93 01/22/21 1045  Resp 22 01/22/21 1045  SpO2 100 % 01/22/21 1045  Vitals shown include unvalidated device data.  Last Pain:  Vitals:   01/22/21 0605  TempSrc:   PainSc: 0-No pain         Complications: No notable events documented.

## 2021-01-23 ENCOUNTER — Other Ambulatory Visit: Payer: Self-pay | Admitting: *Deleted

## 2021-01-23 ENCOUNTER — Encounter (HOSPITAL_COMMUNITY): Payer: Self-pay | Admitting: Obstetrics and Gynecology

## 2021-01-23 ENCOUNTER — Encounter: Payer: Self-pay | Admitting: *Deleted

## 2021-01-23 LAB — SURGICAL PATHOLOGY

## 2021-01-23 MED ORDER — ONDANSETRON 4 MG PO TBDP: 4.0000 mg | ORAL_TABLET | Freq: Three times a day (TID) | ORAL | 0 refills | Status: DC | PRN

## 2021-01-25 NOTE — Discharge Summary (Signed)
Gynecology Discharge Summary Date of Admission: 01/22/2021 Date of Discharge: 01/22/2021  The patient was admitted, as scheduled, and underwent a TLH/BSO/cystoscopy; please refer to operative note for full details.  She was meeting all post op goals and discharged to home from the PACU  Allergies as of 01/22/2021       Reactions   Other    Surgical glue causes skin to be red and blotchy   Latex Rash   Metoclopramide Nausea And Vomiting   Dizziness   Sertraline Rash        Medication List     TAKE these medications    amLODipine 10 MG tablet Commonly known as: NORVASC Take 1 tablet (10 mg total) by mouth daily.   cephALEXin 500 MG capsule Commonly known as: KEFLEX Take 1 capsule (500 mg total) by mouth 2 (two) times daily.   docusate sodium 100 MG capsule Commonly known as: COLACE Take 1 capsule (100 mg total) by mouth 2 (two) times daily for 7 days.   estradiol 0.075 MG/24HR Commonly known as: VIVELLE-DOT Place 1 patch onto the skin 2 (two) times a week. Apply when you get home   fluticasone 50 MCG/ACT nasal spray Commonly known as: FLONASE Place 2 sprays into both nostrils daily.   ibuprofen 600 MG tablet Commonly known as: ADVIL Take 1 tablet (600 mg total) by mouth every 8 (eight) hours as needed for moderate pain.   lisinopril-hydrochlorothiazide 20-25 MG tablet Commonly known as: ZESTORETIC Take 1 tablet by mouth daily.   nitrofurantoin (macrocrystal-monohydrate) 100 MG capsule Commonly known as: MACROBID Take 1 capsule (100 mg total) by mouth 2 (two) times daily.   oxyCODONE-acetaminophen 5-325 MG tablet Commonly known as: Percocet Take 1-2 tablets by mouth every 6 (six) hours as needed for severe pain (no more than 6 tabs daily).   simethicone 80 MG chewable tablet Commonly known as: Gas-X Chew 1 tablet (80 mg total) by mouth every 6 (six) hours as needed for flatulence.   tamsulosin 0.4 MG Caps capsule Commonly known as: FLOMAX Take 1 capsule  (0.4 mg total) by mouth daily.   valACYclovir 500 MG tablet Commonly known as: VALTREX Take 1 tablet (500 mg total) by mouth daily. Can increase to twice a day for 5 days in the event of a recurrence What changed:  when to take this reasons to take this        Future Appointments  Date Time Provider [place]  03/05/2021  9:35 AM Aletha Halim, MD [facility]    Durene Romans MD Attending Center for [facility])

## 2021-01-30 ENCOUNTER — Ambulatory Visit: Payer: BC Managed Care – PPO | Admitting: Family Medicine

## 2021-02-04 ENCOUNTER — Encounter: Payer: Self-pay | Admitting: Obstetrics and Gynecology

## 2021-02-05 ENCOUNTER — Emergency Department
Admission: EM | Admit: 2021-02-05 | Discharge: 2021-02-05 | Disposition: A | Discharge status: Home / Self Care | Payer: BC Managed Care – PPO | Attending: Emergency Medicine | Admitting: Emergency Medicine

## 2021-02-05 ENCOUNTER — Other Ambulatory Visit: Payer: Self-pay

## 2021-02-05 ENCOUNTER — Emergency Department: Payer: BC Managed Care – PPO

## 2021-02-05 DIAGNOSIS — Z5321 Procedure and treatment not carried out due to patient leaving prior to being seen by health care provider: Secondary | ICD-10-CM | POA: Diagnosis not present

## 2021-02-05 DIAGNOSIS — R519 Headache, unspecified: Secondary | ICD-10-CM | POA: Insufficient documentation

## 2021-02-05 DIAGNOSIS — M79661 Pain in right lower leg: Secondary | ICD-10-CM | POA: Diagnosis not present

## 2021-02-05 NOTE — ED Triage Notes (Signed)
Pt in with co right calf pain that started yesterday, pt has had hysterectomy 2 weeks ago. Pt also co headache.

## 2021-02-07 ENCOUNTER — Ambulatory Visit (INDEPENDENT_AMBULATORY_CARE_PROVIDER_SITE_OTHER): Payer: BC Managed Care – PPO | Admitting: Obstetrics and Gynecology

## 2021-02-07 ENCOUNTER — Encounter: Payer: Self-pay | Admitting: Obstetrics and Gynecology

## 2021-02-07 ENCOUNTER — Other Ambulatory Visit: Payer: Self-pay

## 2021-02-07 VITALS — BP 137/91 | HR 98 | Wt 174.0 lb

## 2021-02-07 DIAGNOSIS — R519 Headache, unspecified: Secondary | ICD-10-CM | POA: Diagnosis not present

## 2021-02-07 DIAGNOSIS — R109 Unspecified abdominal pain: Secondary | ICD-10-CM

## 2021-02-07 DIAGNOSIS — Z09 Encounter for follow-up examination after completed treatment for conditions other than malignant neoplasm: Secondary | ICD-10-CM

## 2021-02-07 DIAGNOSIS — R5383 Other fatigue: Secondary | ICD-10-CM

## 2021-02-07 LAB — COMPREHENSIVE METABOLIC PANEL
ALT: 9 IU/L (ref 0–32)
AST: 12 IU/L (ref 0–40)
Albumin/Globulin Ratio: 1.5 (ref 1.2–2.2)
Albumin: 4.3 g/dL (ref 3.8–4.8)
Alkaline Phosphatase: 117 IU/L (ref 44–121)
BUN/Creatinine Ratio: 9 (ref 9–23)
BUN: 8 mg/dL (ref 6–20)
Bilirubin Total: 0.3 mg/dL (ref 0.0–1.2)
CO2: 24 mmol/L (ref 20–29)
Calcium: 9.2 mg/dL (ref 8.7–10.2)
Chloride: 100 mmol/L (ref 96–106)
Creatinine, Ser: 0.88 mg/dL (ref 0.57–1.00)
Globulin, Total: 2.8 g/dL (ref 1.5–4.5)
Glucose: 97 mg/dL (ref 70–99)
Potassium: 4.1 mmol/L (ref 3.5–5.2)
Sodium: 139 mmol/L (ref 134–144)
Total Protein: 7.1 g/dL (ref 6.0–8.5)
eGFR: 86 mL/min/{1.73_m2} (ref >59)

## 2021-02-07 LAB — POCT URINALYSIS DIPSTICK
Bilirubin, UA: NEGATIVE
Glucose, UA: NEGATIVE
Ketones, UA: NEGATIVE
Nitrite, UA: NEGATIVE
Protein, UA: POSITIVE — AB
Spec Grav, UA: 1.02 (ref 1.010–1.025)
Urobilinogen, UA: 0.2 E.U./dL

## 2021-02-07 LAB — CBC
Hematocrit: 44 % (ref 34.0–46.6)
Hemoglobin: 14.5 g/dL (ref 11.1–15.9)
MCH: 27.5 pg (ref 26.6–33.0)
MCHC: 33 g/dL (ref 31.5–35.7)
MCV: 84 fL (ref 79–97)
Platelets: 411 10*3/uL (ref 150–450)
RBC: 5.27 x10E6/uL (ref 3.77–5.28)
RDW: 12.3 % (ref 11.7–15.4)
WBC: 6.4 10*3/uL (ref 3.4–10.8)

## 2021-02-07 MED ORDER — FLUCONAZOLE 150 MG PO TABS: 150.0000 mg | ORAL_TABLET | Freq: Once | ORAL | 3 refills | Status: AC

## 2021-02-07 MED ORDER — ESTRADIOL 0.05 MG/24HR TD PTTW: 1.0000 | MEDICATED_PATCH | TRANSDERMAL | 12 refills | Status: DC

## 2021-02-07 MED ORDER — METRONIDAZOLE 500 MG PO TABS: 500.0000 mg | ORAL_TABLET | Freq: Two times a day (BID) | ORAL | 0 refills | Status: DC

## 2021-02-07 MED ORDER — POLYETHYLENE GLYCOL 3350 17 G PO PACK: 17.0000 g | PACK | Freq: Two times a day (BID) | ORAL | 0 refills | Status: DC

## 2021-02-07 NOTE — Progress Notes (Signed)
Obstetrics and Gynecology Visit Return Patient Evaluation  Appointment Date: 02/07/2021  Primary Care Provider: Inda Coke  OBGYN Clinic: [facility]  Chief Complaint: post op complaints  History of Present Illness:  Alisha Hughes is a 40 y.o. s/p 10/11 TLH/BSO/cystoscopy. She was discharged home from the PACU and final pathology negative  Starting on Monday, she tried to go back to work and thinks it was too much walking around and driving and started having low belly pressure and sharp pains and some increased VB and right leg pain. She went to ED for the latter and had a neg RLE doppler. Pt hasn't gone back to work since that one time. She is also having HAs and lethargy. No night sweats or hot flashes. She is taking the estrogen patch 0.075 q3d and is only using the motrin for pain relief. +constipation and needed milk of mg  Review of Systems: as noted in the History of Present Illness.  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 Rann had no medications administered during this visit. Current Outpatient Medications  Medication Sig Dispense Refill   amLODipine (NORVASC) 10 MG tablet Take 1 tablet (10 mg total) by mouth daily. 90 tablet 3   estradiol (VIVELLE-DOT) 0.075 MG/24HR Place 1 patch onto the skin 2 (two) times a week. Apply when you get home 8 patch 12   ibuprofen (ADVIL) 600 MG tablet Take 1 tablet (600 mg total) by mouth every 8 (eight) hours  as needed for moderate pain. 20 tablet 0   lisinopril-hydrochlorothiazide (ZESTORETIC) 20-25 MG tablet Take 1 tablet by mouth daily. 90 tablet 3   fluticasone (FLONASE) 50 MCG/ACT nasal spray Place 2 sprays into both nostrils daily. (Patient not taking: No sig reported) 9.9 g 0   ondansetron (ZOFRAN ODT) 4 MG disintegrating tablet Take 1 tablet (4 mg total) by mouth every 8 (eight) hours as needed for nausea or vomiting. (Patient not taking: Reported on 02/07/2021) 20 tablet 0   oxyCODONE-acetaminophen (PERCOCET) 5-325 MG tablet Take 1-2 tablets by mouth every 6 (six) hours as needed for severe pain (no more than 6 tabs daily). (Patient not taking: Reported on 02/07/2021) 30 tablet 0   simethicone (GAS-X) 80 MG chewable tablet Chew 1 tablet (80 mg total) by mouth every 6 (six) hours as needed for flatulence. (Patient not taking: Reported on 02/07/2021) 30 tablet 0   tamsulosin (FLOMAX) 0.4 MG CAPS capsule Take 1 capsule (0.4 mg total) by mouth daily. (Patient not taking: No sig reported) 30 capsule 3   valACYclovir (VALTREX) 500 MG tablet Take 1 tablet (500 mg total) by mouth daily. Can increase to twice a day for 5 days in the event of a recurrence (Patient not taking: Reported on 02/07/2021) 30 tablet 5   No current facility-administered medications for this visit.    Allergies: is allergic to other, latex, metoclopramide, and sertraline.  Physical Exam:  BP (!) 137/91   Pulse 98   Wt 174 lb (78.9 kg)   LMP  12/14/2020 (Approximate)   BMI 30.82 kg/m  Body mass index is 30.82 kg/m. General appearance: Well nourished, well developed female in no acute distress.  Abdomen: diffusely non tender to palpation, non distended, and no masses, hernias. Well healed l/s port sites, nttp, nd Neuro/Psych:  Normal mood and affect.   Ext: nttp, normal b/l   Pelvic exam:  EGBUS: normal Vaginal vault: pink d/c in vault and white cottage cheese like d/c Cuff: healing well with a small friable spot of  granulation tissue, nttp. Silver nitrate applied Bimanual: negative  U/A: +blood and leuks Assessment: pt stable  Plan:  1. Abdominal pain, unspecified abdominal location Recommend miralax bid to help with constipation. Will send ucx and check labs. S/s could be due to trying too much too soon and maybe estrogen patch is too high so will go down to 0.05. check labs today - POCT Urinalysis Dipstick - Urine Culture - CBC - Comprehensive metabolic panel  2. Nonintractable headache, unspecified chronicity pattern, unspecified headache type - CBC - Comprehensive metabolic panel  3. Lethargy   RTC: 1wk virtual visit.   Durene Romans MD Attending Center for [employer])

## 2021-02-07 NOTE — Progress Notes (Signed)
Pt presents for problem visit.   Total laparoscopic hysterectomy, bilateral salpingoophorectomy, cystoscopy on 01/22/21  Pt went to work Monday notes had throbbing pain in right leg and painful HA,fatigue, abdominal pain/pressure . Also c/o cramping and bright red bleeding.

## 2021-02-12 ENCOUNTER — Telehealth: Payer: Self-pay

## 2021-02-12 ENCOUNTER — Telehealth: Payer: BC Managed Care – PPO | Admitting: Obstetrics and Gynecology

## 2021-02-12 NOTE — Telephone Encounter (Signed)
Called the pt 3 times for mychart visit and the phone is going to voicemail

## 2021-02-13 LAB — URINE CULTURE

## 2021-02-15 MED ORDER — SULFAMETHOXAZOLE-TRIMETHOPRIM 800-160 MG PO TABS: 1.0000 | ORAL_TABLET | Freq: Two times a day (BID) | ORAL | 0 refills | Status: AC

## 2021-02-15 NOTE — Addendum Note (Signed)
Addended by: Aletha Halim on: 02/15/2021 08:48 AM   Modules accepted: Orders

## 2021-02-18 ENCOUNTER — Telehealth: Payer: Self-pay

## 2021-02-18 NOTE — Telephone Encounter (Signed)
-----   Message from Aletha Halim, MD sent at 02/18/2021 11:07 AM EST ----- Can you let her know that I sent in abx for her UTI. thanks

## 2021-02-18 NOTE — Telephone Encounter (Signed)
TC to pt regarding results for +UTI and that Rx has been sent by provider.  Pt not ava lvm

## 2021-02-25 ENCOUNTER — Other Ambulatory Visit: Payer: Self-pay

## 2021-02-25 ENCOUNTER — Ambulatory Visit (INDEPENDENT_AMBULATORY_CARE_PROVIDER_SITE_OTHER): Payer: BC Managed Care – PPO | Admitting: Family Medicine

## 2021-02-25 VITALS — BP 131/93 | HR 87 | Wt 176.0 lb

## 2021-02-25 DIAGNOSIS — G8918 Other acute postprocedural pain: Secondary | ICD-10-CM | POA: Diagnosis not present

## 2021-02-25 DIAGNOSIS — N939 Abnormal uterine and vaginal bleeding, unspecified: Secondary | ICD-10-CM | POA: Diagnosis not present

## 2021-02-25 NOTE — Progress Notes (Signed)
   GYNECOLOGY PROBLEM  VISIT ENCOUNTER NOTE  Subjective:   Alisha Hughes is a 40 y.o. G22P1101 female here for a problem GYN visit.  Current complaints: pain and bleeding  S/p lap hysterectomy 01/22/2021. Reports at 2 wks came to the office for evaluation due to bleeding. Reports she was doing well until Saturday night. She went out on Sat and stood for 2 hours and came home and had cramping. She reports she also filled up her panty liner with blood overnight. Reports cramping sensation in lower abdomen and pain to the point where she is needing OTC medication and would have wanted stronger but does not have any left.   Denies abnormal vaginal bleeding, discharge, pelvic pain, problems with intercourse or other gynecologic concerns.    Gynecologic History Patient's last menstrual period was 12/14/2020 (approximate).  Contraception: status post hysterectomy  Health Maintenance Due  Topic Date Due   Hepatitis C Screening  Never done   COVID-19 Vaccine (4 - Booster for Pfizer series) 05/09/2020   INFLUENZA VACCINE  11/12/2020    The following portions of the patient's history were reviewed and updated as appropriate: allergies, current medications, past family history, past medical history, past social history, past surgical history and problem list.  Review of Systems Pertinent items are noted in HPI.   Objective:  BP (!) 131/93   Pulse 87   Wt 176 lb (79.8 kg)   LMP 12/14/2020 (Approximate)   BMI 31.18 kg/m  Gen: well appearing, NAD HEENT: no scleral icterus CV: RR Lung: Normal WOB Ext: warm well perfused  PELVIC: Normal appearing external genitalia; normal appearing vaginal mucosa and cervix.  Serosanguinous cloudy discharge present in vaginal vault. No bright red blood.  Vaginal cuff is is intact on appearance. Sutures from surgery still present with granulation tissue present and appears friable. Silver nitrate applied with good effect.  On bimanual exam surgically absent  uterus, no palpable masses, no uterine or adnexal tenderness. Palpated cuff and appears intact. Not overly tender (no guarding or rebound on exam). No fluctuance present and abdominal exam benign.    Assessment and Plan:   1. Post-op pain  2. Vaginal bleeding Likely from granulation tissue Has f/u next week with Dr. Vilma Prader Dr. Ilda Basset to review case after my assessment  Please refer to After Visit Summary for other counseling recommendations.   Future Appointments  Date Time Provider [place]  03/05/2021  9:35 AM Aletha Halim, MD [facility]    Caren Macadam, MD, MPH, ABFM Attending [place] for [facility]

## 2021-02-26 ENCOUNTER — Encounter: Payer: Self-pay | Admitting: Family Medicine

## 2021-03-05 ENCOUNTER — Ambulatory Visit (INDEPENDENT_AMBULATORY_CARE_PROVIDER_SITE_OTHER): Payer: BC Managed Care – PPO | Admitting: Obstetrics and Gynecology

## 2021-03-05 ENCOUNTER — Encounter: Payer: Self-pay | Admitting: Obstetrics and Gynecology

## 2021-03-05 ENCOUNTER — Other Ambulatory Visit (HOSPITAL_COMMUNITY)
Admission: RE | Admit: 2021-03-05 | Discharge: 2021-03-05 | Disposition: A | Discharge status: Home / Self Care | Payer: BC Managed Care – PPO | Source: Ambulatory Visit | Attending: Obstetrics and Gynecology | Admitting: Obstetrics and Gynecology

## 2021-03-05 ENCOUNTER — Encounter: Payer: Self-pay | Admitting: Radiology

## 2021-03-05 ENCOUNTER — Other Ambulatory Visit: Payer: Self-pay

## 2021-03-05 VITALS — BP 136/90 | HR 85 | Wt 180.0 lb

## 2021-03-05 DIAGNOSIS — N898 Other specified noninflammatory disorders of vagina: Secondary | ICD-10-CM | POA: Insufficient documentation

## 2021-03-05 DIAGNOSIS — A58 Granuloma inguinale: Secondary | ICD-10-CM | POA: Diagnosis not present

## 2021-03-05 DIAGNOSIS — Z09 Encounter for follow-up examination after completed treatment for conditions other than malignant neoplasm: Secondary | ICD-10-CM

## 2021-03-05 NOTE — Progress Notes (Signed)
Obstetrics and Gynecology Visit Return Patient Evaluation  Appointment Date: 03/05/2021  Primary Care Provider: [place], [place] [facility]: [facility]   Chief Complaint: post op check  History of Present Illness:  Alisha Hughes is a 40 y.o. s/p 10/11 TLH/BSO cyst for failed endometrial ablation and h/o recurrent ovarian cysts.   Post op course c/b granulation tissue at the cuff and UTI.  Patient states she took the 5d of bactrim for the UTI w/o issue and finished it about 2 weeks ago. Pt seen last week and silver nitrate applied to tissue  Current vivelle dose is working well for her and h/o climateric s/s or headaches.   Interval History: Since that time, she states that she still has some VB and discharge that increases with physical activity. VB appears bright.   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 Schriver had no medications administered during this visit. Current Outpatient Medications  Medication Sig Dispense Refill   amLODipine (NORVASC) 10 MG tablet Take 1 tablet (10 mg total) by mouth daily. 90 tablet 3   estradiol (VIVELLE-DOT) 0.05 MG/24HR patch Place 1 patch (0.05 mg total) onto the skin 2 (two) times a week. 8 patch 12   ibuprofen (ADVIL) 600 MG tablet Take 1 tablet (600 mg total) by mouth every 8 (eight) hours as needed for moderate pain. 20 tablet 0   lisinopril-hydrochlorothiazide (ZESTORETIC) 20-25 MG tablet Take 1 tablet by mouth daily. 90 tablet 3    fluticasone (FLONASE) 50 MCG/ACT nasal spray Place 2 sprays into both nostrils daily. (Patient not taking: Reported on 12/28/2020) 9.9 g 0   metroNIDAZOLE (FLAGYL) 500 MG tablet Take 1 tablet (500 mg total) by mouth 2 (two) times daily. (Patient not taking: Reported on 03/05/2021) 14 tablet 0   ondansetron (ZOFRAN ODT) 4 MG disintegrating tablet Take 1 tablet (4 mg total) by mouth every 8 (eight) hours as needed for nausea or vomiting. (Patient not taking: Reported on 02/07/2021) 20 tablet 0   oxyCODONE-acetaminophen (PERCOCET) 5-325 MG tablet Take 1-2 tablets by mouth every 6 (six) hours as needed for severe pain (no more than 6 tabs daily). (Patient not taking: Reported on 02/07/2021) 30 tablet 0   polyethylene glycol (MIRALAX / GLYCOLAX) 17 g packet Take 17 g by mouth 2 (two) times daily. 14 each 0   simethicone (GAS-X) 80 MG chewable tablet Chew 1 tablet (80 mg total) by mouth every 6 (six) hours as needed for flatulence. (Patient not taking: Reported on 02/07/2021) 30 tablet 0   tamsulosin (FLOMAX) 0.4 MG CAPS capsule Take 1 capsule (0.4 mg total) by mouth daily. (Patient not taking: No sig reported) 30 capsule 3   valACYclovir (VALTREX) 500 MG tablet Take 1 tablet (500 mg total) by mouth daily. Can increase to twice a day for 5 days in the event of a recurrence (Patient not taking: Reported on 02/07/2021) 30 tablet 5   No current facility-administered medications for this visit.    Allergies: is allergic to other, latex, metoclopramide,  and sertraline.  Physical Exam:  BP 136/90   Pulse 85   Wt 180 lb (81.6 kg)   LMP 12/14/2020 (Approximate)   BMI 31.89 kg/m  Body mass index is 31.89 kg/m. General appearance: Well nourished, well developed female in no acute distress.  Abdomen: diffusely non tender to palpation, non distended, and no masses, hernias. Well healed l/s port site Back: no CVAT Neuro/Psych:  Normal mood and affect.    Pelvic exam:  EGBUS: normal Vaginal vault: pink d/c  in vault Cuff: intact, nttp, 3cm area of granulation tissue at the cuff. Monsels applied  Assessment: pt stable  Plan:  1. Granulation tissue at vaginal vault F/u in two weeks. Continue pelvic rest and 15lbs weight restriction  2. Postop check  3. Vaginal discharge - Cervicovaginal ancillary only( [facility])  Aletha Halim, Brooke Bonito MD Attending Center for [facility])

## 2021-03-05 NOTE — Progress Notes (Signed)
Patient presents for Post Op. Pt had Total laparoscopic hysterectomy, bilateral salpingoophorectomy, cystoscopy on 01/22/21.  Notes still having vaginal bleeding here and there.

## 2021-03-06 LAB — CERVICOVAGINAL ANCILLARY ONLY
Bacterial Vaginitis (gardnerella): NEGATIVE
Candida Glabrata: POSITIVE — AB
Candida Vaginitis: NEGATIVE
Comment: NEGATIVE
Comment: NEGATIVE
Comment: NEGATIVE

## 2021-03-06 MED ORDER — FLUCONAZOLE 150 MG PO TABS: 150.0000 mg | ORAL_TABLET | ORAL | 0 refills | Status: AC

## 2021-03-06 NOTE — Addendum Note (Signed)
Addended by: Aletha Halim on: 03/06/2021 04:36 PM   Modules accepted: Orders

## 2021-03-15 NOTE — Progress Notes (Signed)
Alisha Hughes is a 40 y.o. female here for follow up of anxiety.   History of Present Illness:   Chief Complaint  Patient presents with   Anxiety    Pt c/o anxiety and wants to discuss treatment and options. Had full Hysterectomy 01/22/2021 and anxiety has reached a high with no medication. Also want to discuss weight loss.    HPI  Depression/Anxiety Alisha Hughes presents with concerns of increased feelings of anxiety. States she recently underwent a full hysterectomy on 01/22/21 and upon recovering, her anxiety reached a new high. States it has gotten to the point where on certain days she could not get out of bed.   Alisha Hughes states she was previously on Wellbutrin XL 150 mg for decreased mood but had tapered herself off of this medication with no complications in the past year. Past medication efforts include: zoloft and lexapro. Although she had experienced an increase in her appetite, she is interested in restarting this medication. Denies SI/HI.   Weight Gain In addition to experiencing increased anxiety, Alisha Hughes has also been trying to lose weight. As she recovers from her recent full hysterectomy with bilateral oophorectomy, she has been instructed that she would not be able to fully exercise until around January or February. She has tried to walk, but this proved difficult due to her constant fatigue. At this time she is interested in trialing a weight loss aid.   Past Medical History:  Diagnosis Date   Abnormal ECG 02/03/2018   Anxiety and depression    History of kidney stones    HSV (herpes simplex virus) infection 02/10/2018   Hyperlipidemia    Hypertension      Social History   Tobacco Use   Smoking status: Never   Smokeless tobacco: Never  Vaping Use   Vaping Use: Never used  Substance Use Topics   Alcohol use: Yes    Alcohol/week: 0.0 standard drinks    Comment: rarely   Drug use: No    Past Surgical History:  Procedure Laterality Date   ABDOMINAL  HYSTERECTOMY     CESAREAN SECTION     CESAREAN SECTION N/A 12/19/2012   Procedure: Repeat cesarean section with delivery of baby girl at 70.    Bilateral tubal ligation with filshie clips.;  Surgeon: Donnamae Jude, MD;  Location: [facility];  Service: Obstetrics;  Laterality: N/A;   CHOLECYSTECTOMY  2010   COLONOSCOPY     2009   COLPOSCOPY VULVA W/ BIOPSY     abnormal pap   CYSTOSCOPY N/A 01/22/2021   Procedure: CYSTOSCOPY;  Surgeon: Aletha Halim, MD;  Location: [facility];  Service: Gynecology;  Laterality: N/A;   ENDOMETRIAL ABLATION N/A 07/17/2015   Procedure: ENDOMETRIAL ABLATION WITH HTA, DILATATION AND CURRATAGE;  Surgeon: Emily Filbert, MD;  Location: [facility];  Service: Gynecology;  Laterality: N/A;   KNEE ARTHROSCOPY     LAPAROSCOPIC LYSIS OF ADHESIONS  07/17/2015   Procedure: LAPAROSCOPIC LYSIS OF ADHESIONS;  Surgeon: Emily Filbert, MD;  Location: [facility];  Service: Gynecology;;   LAPAROSCOPY N/A 07/17/2015   Procedure: LAPAROSCOPY DIAGNOSTIC;  Surgeon: Emily Filbert, MD;  Location: [facility];  Service: Gynecology;  Laterality: N/A;   TOTAL LAPAROSCOPIC HYSTERECTOMY WITH BILATERAL SALPINGO OOPHORECTOMY Bilateral 01/22/2021   Procedure: TOTAL LAPAROSCOPIC HYSTERECTOMY WITH BILATERAL SALPINGO OOPHORECTOMY, OPEN VAGINAL  REPAIR OF VAGINAL CUFF;  Surgeon: Aletha Halim, MD;  Location: [facility];  Service: Gynecology;  Laterality: Bilateral;   TUBAL LIGATION     UPPER GI  ENDOSCOPY     WISDOM TOOTH EXTRACTION      Family History  Problem Relation Age of Onset   Hypertension Mother    Anuerysm Mother        brain   Migraines Mother    Heart disease Father    Hypertension Father    Hyperlipidemia Father    Prostate cancer Father    Depression Brother    Multiple sclerosis Maternal Grandmother    Sudden death Maternal Grandmother    Heart disease Maternal Grandfather    Sudden death Maternal Grandfather    Heart disease Paternal Grandmother    Diabetes Paternal Grandmother    Parkinson's  disease Paternal Grandfather     Allergies  Allergen Reactions   Other     Surgical glue causes skin to be red and blotchy   Latex Rash   Metoclopramide Nausea And Vomiting     Dizziness   Sertraline Rash    Current Medications:   Current Outpatient Medications:    amLODipine (NORVASC) 10 MG tablet, Take 1 tablet (10 mg total) by mouth daily., Disp: 90 tablet, Rfl: 3   estradiol (VIVELLE-DOT) 0.05 MG/24HR patch, Place 1 patch (0.05 mg total) onto the skin 2 (two) times a week., Disp: 8 patch, Rfl: 12   fluticasone (FLONASE) 50 MCG/ACT nasal spray, Place 2 sprays into both nostrils daily., Disp: 9.9 g, Rfl: 0   ibuprofen (ADVIL) 600 MG tablet, Take 1 tablet (600 mg total) by mouth every 8 (eight) hours as needed for moderate pain., Disp: 20 tablet, Rfl: 0   lisinopril-hydrochlorothiazide (ZESTORETIC) 20-25 MG tablet, Take 1 tablet by mouth daily., Disp: 90 tablet, Rfl: 3   Review of Systems:   ROS Negative unless otherwise specified per HPI. Vitals:   Vitals:   03/18/21 0912  BP: (!) 136/91  Pulse: 78  Temp: (!) 97.5 F (36.4 C)  TempSrc: Temporal  SpO2: 100%  Weight: 179 lb 12.8 oz (81.6 kg)  Height: 5\' 3"  (1.6 m)     Body mass index is 31.85 kg/m.  Physical Exam:   Physical Exam Vitals and nursing note reviewed.  Constitutional:      General: She is not in acute distress.    Appearance: She is well-developed. She is not ill-appearing or toxic-appearing.  Cardiovascular:     Rate and Rhythm: Normal rate and regular rhythm.     Pulses: Normal pulses.     Heart sounds: Normal heart sounds, S1 normal and S2 normal.  Pulmonary:     Effort: Pulmonary effort is normal.     Breath sounds: Normal breath sounds.  Skin:    General: Skin is warm and dry.  Neurological:     Mental Status: She is alert.     GCS: GCS eye subscore is 4. GCS verbal subscore is 5. GCS motor subscore is 6.  Psychiatric:        Speech: Speech normal.        Behavior: Behavior normal.  Behavior is cooperative.    Assessment and Plan:   Obesity, unspecified classification, unspecified obesity type, unspecified whether serious comorbidity present Uncontrolled Trial Saxenda 0.6 mg weekly injection, increase by 0.6 mg weekly until 3 mg daily is reached. Informed patient that if they cannot tolerate an increased dose during dose escalation, consider delaying dose increase by an additional week Sample provided -- I have asked her let us know if she would ike to continue this script and for [place] to call it in Continue healthy lifestyle  efforts Follow up in 3 months, sooner if concerns  Anxiety and depression Uncontrolled Start Wellbutrin XL 150 mg daily, follow-up in 3 months  Patient denied any SI/HI at today's visit Advised patient that if they develop any SI, to tell someone immediately and seek medical attention  Need for Influenza Vaccination Completed today  I,Havlyn C Ratchford,acting as a scribe for [employer], PA.,have documented all relevant documentation on the behalf of Inda Coke, PA,as directed by  Inda Coke, PA while in the presence of Inda Coke, [place].  I, Inda Coke, [place], have reviewed all documentation for this visit. The documentation on 03/18/21 for the exam, diagnosis, procedures, and orders are all accurate and complete.   Inda Coke, PA-C

## 2021-03-18 ENCOUNTER — Other Ambulatory Visit: Payer: Self-pay

## 2021-03-18 ENCOUNTER — Ambulatory Visit (INDEPENDENT_AMBULATORY_CARE_PROVIDER_SITE_OTHER): Payer: BC Managed Care – PPO | Admitting: Physician Assistant

## 2021-03-18 ENCOUNTER — Encounter: Payer: Self-pay | Admitting: Physician Assistant

## 2021-03-18 VITALS — BP 136/91 | HR 78 | Temp 97.5°F | Ht 63.0 in | Wt 179.8 lb

## 2021-03-18 DIAGNOSIS — E669 Obesity, unspecified: Secondary | ICD-10-CM

## 2021-03-18 DIAGNOSIS — Z23 Encounter for immunization: Secondary | ICD-10-CM | POA: Diagnosis not present

## 2021-03-18 DIAGNOSIS — F32A Depression, unspecified: Secondary | ICD-10-CM

## 2021-03-18 DIAGNOSIS — F419 Anxiety disorder, unspecified: Secondary | ICD-10-CM

## 2021-03-18 MED ORDER — BUPROPION HCL ER (XL) 150 MG PO TB24: 150.0000 mg | ORAL_TABLET | Freq: Every day | ORAL | 1 refills | Status: DC

## 2021-03-18 NOTE — Patient Instructions (Signed)
It was great to see you!  Start Wellbutrin 150 mg daily  For saxenda: Initial: 0.6 mg once daily for 1 week; increase by 0.6 mg daily at weekly intervals to a target dose of 3 mg once daily. If the patient cannot tolerate an increased dose during dose escalation, consider delaying dose escalation for 1 additional week.   If you would like for me to prescribe this, let me know  Let's follow-up in 3 months, sooner if you have concerns.  Take care,  Inda Coke PA-C

## 2021-03-20 ENCOUNTER — Encounter: Payer: Self-pay | Admitting: Physician Assistant

## 2021-03-21 ENCOUNTER — Encounter: Payer: Self-pay | Admitting: Radiology

## 2021-03-21 ENCOUNTER — Ambulatory Visit (INDEPENDENT_AMBULATORY_CARE_PROVIDER_SITE_OTHER): Payer: BC Managed Care – PPO | Admitting: Obstetrics and Gynecology

## 2021-03-21 ENCOUNTER — Encounter: Payer: Self-pay | Admitting: Obstetrics and Gynecology

## 2021-03-21 ENCOUNTER — Other Ambulatory Visit: Payer: Self-pay

## 2021-03-21 VITALS — BP 132/97 | HR 85 | Wt 179.0 lb

## 2021-03-21 DIAGNOSIS — Z09 Encounter for follow-up examination after completed treatment for conditions other than malignant neoplasm: Secondary | ICD-10-CM

## 2021-03-21 NOTE — Progress Notes (Signed)
RGYN patient presents for F/U visit today as advised per notes..   Last seen 03/05/21 for post op visit   Pt has no concerns today states she is doing much better since last visit. No bleeding and no pain.

## 2021-03-22 ENCOUNTER — Other Ambulatory Visit: Payer: Self-pay | Admitting: Physician Assistant

## 2021-03-22 MED ORDER — ONDANSETRON HCL 4 MG PO TABS: 4.0000 mg | ORAL_TABLET | Freq: Three times a day (TID) | ORAL | 0 refills | Status: DC | PRN

## 2021-03-22 NOTE — Progress Notes (Signed)
Obstetrics and Gynecology Visit Return Patient Evaluation  Appointment Date: 03/21/2021  Primary Care Provider: [place], [place] [facility]: [facility]  Chief Complaint: vaginal cuff check  History of Present Illness:  Alisha Hughes is a 40 y.o. with above CC. No VB or spotting  Review of Systems: as noted in the History of Present Illness.  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 Current Outpatient Medications  Medication Sig Dispense Refill   amLODipine (NORVASC) 10 MG tablet Take 1 tablet (10 mg total) by mouth daily. 90 tablet 3   buPROPion (WELLBUTRIN XL) 150 MG 24 hr tablet Take 1 tablet (150 mg total) by mouth daily. 90 tablet 1   estradiol (VIVELLE-DOT) 0.05 MG/24HR patch Place 1 patch (0.05 mg total) onto the skin 2 (two) times a week. 8 patch 12   lisinopril-hydrochlorothiazide (ZESTORETIC) 20-25 MG tablet Take 1 tablet by mouth daily. 90 tablet 3   No current facility-administered medications for this visit.    Allergies: is allergic to other, latex, metoclopramide, and sertraline.  Physical Exam:  BP (!) 132/97   Pulse 85   Wt 179 lb (81.2 kg)   LMP 12/14/2020 (Approximate)   BMI 31.71 kg/m  Body mass index is 31.71 kg/m. General appearance: Well nourished, well developed female in no acute distress.  Abdomen: diffusely non tender to palpation, non distended, and no masses, hernias Neuro/Psych:  Normal mood and affect.    Pelvic exam:  EGBUS:  normal Vaginal vault: normal, no blood or discharge Cuff: healed, nttp, no e/o granuation tissue Bimanual: negative   Assessment: pt doing well  Plan: Continue pelvic rest until Jan 1. Pt feeling tired but no hot flashes or night sweats. Recommend exp management until Jan 1 and can consider going up on vivelle dot patch dose  RTC: PRN  Durene Romans MD Attending Center for [employer] [facility])

## 2021-04-09 ENCOUNTER — Encounter: Payer: Self-pay | Admitting: Physician Assistant

## 2021-04-09 NOTE — Telephone Encounter (Signed)
Alisha Hughes, okay for pt to go up to 1.8 on Saxenda?

## 2021-04-10 MED ORDER — SAXENDA 18 MG/3ML ~~LOC~~ SOPN
1.8000 mg | PEN_INJECTOR | Freq: Every day | SUBCUTANEOUS | 1 refills | Status: DC
Start: 2021-04-10 — End: 2021-06-14

## 2021-05-24 ENCOUNTER — Encounter: Payer: Self-pay | Admitting: Physician Assistant

## 2021-05-25 NOTE — Telephone Encounter (Signed)
Please advise 

## 2021-06-13 ENCOUNTER — Encounter: Payer: Self-pay | Admitting: Physician Assistant

## 2021-06-14 MED ORDER — TIRZEPATIDE 2.5 MG/0.5ML ~~LOC~~ SOAJ: 2.5000 mg | SUBCUTANEOUS | 0 refills | Status: DC

## 2021-06-14 NOTE — Telephone Encounter (Signed)
Dr. Cherlynn Kaiser, okay to start pt on Mounjaro 0.25 mg weekly? ?

## 2021-07-18 ENCOUNTER — Other Ambulatory Visit: Payer: Self-pay | Admitting: Neurology

## 2021-07-18 DIAGNOSIS — R519 Headache, unspecified: Secondary | ICD-10-CM

## 2021-08-16 ENCOUNTER — Telehealth: Payer: Self-pay | Admitting: Family

## 2021-08-16 ENCOUNTER — Other Ambulatory Visit: Payer: Self-pay | Admitting: Physician Assistant

## 2021-08-16 DIAGNOSIS — J029 Acute pharyngitis, unspecified: Secondary | ICD-10-CM

## 2021-08-16 MED ORDER — FLUTICASONE PROPIONATE 50 MCG/ACT NA SUSP: 2.0000 | Freq: Every day | NASAL | 6 refills | Status: DC

## 2021-08-16 MED ORDER — CETIRIZINE HCL 10 MG PO TABS: 10.0000 mg | ORAL_TABLET | Freq: Every day | ORAL | 1 refills | Status: DC

## 2021-08-16 MED ORDER — ONDANSETRON HCL 4 MG PO TABS: 4.0000 mg | ORAL_TABLET | Freq: Three times a day (TID) | ORAL | 0 refills | Status: DC | PRN

## 2021-08-16 NOTE — Progress Notes (Signed)
?E-Visit for Sore Throat ? ?We are sorry that you are not feeling well.  Here is how we plan to help! ? ?Your symptoms indicate a likely viral infection (Pharyngitis).   Pharyngitis is inflammation in the back of the throat which can cause a sore throat, scratchiness and sometimes difficulty swallowing.   Pharyngitis is typically caused by a respiratory virus and will just run its course.  Please keep in mind that your symptoms could last up to 10 days.  For throat pain, we recommend over the counter oral pain relief medications such as acetaminophen or aspirin, or anti-inflammatory medications such as ibuprofen or naproxen sodium.  Topical treatments such as oral throat lozenges or sprays may be used as needed.  Avoid close contact with loved ones, especially the very young and elderly.  Remember to wash your hands thoroughly throughout the day as this is the number one way to prevent the spread of infection and wipe down door knobs and counters with disinfectant. ? ?I have sent a prescription of zyrtec 10 mg and flonase nasal spray.  ? ?After careful review of your answers, I would not recommend an antibiotic for your condition.  Antibiotics should not be used to treat conditions that we suspect are caused by viruses like the virus that causes the common cold or flu. However, some people can have Strep with atypical symptoms. You may need formal testing in clinic or office to confirm if your symptoms continue or worsen. ? ?Providers prescribe antibiotics to treat infections caused by bacteria. Antibiotics are very powerful in treating bacterial infections when they are used properly.  To maintain their effectiveness, they should be used only when necessary.  Overuse of antibiotics has resulted in the development of super bugs that are resistant to treatment!   ? ?Home Care: ?Only take medications as instructed by your medical team. ?Do not drink alcohol while taking these medications. ?A steam or ultrasonic  humidifier can help congestion.  You can place a towel over your head and breathe in the steam from hot water coming from a faucet. ?Avoid close contacts especially the very young and the elderly. ?Cover your mouth when you cough or sneeze. ?Always remember to wash your hands. ? ?Get Help Right Away If: ?You develop worsening fever or throat pain. ?You develop a severe head ache or visual changes. ?Your symptoms persist after you have completed your treatment plan. ? ?Make sure you ?Understand these instructions. ?Will watch your condition. ?Will get help right away if you are not doing well or get worse. ? ? ?Thank you for choosing an e-visit. ? ?Your e-visit answers were reviewed by a board certified advanced clinical practitioner to complete your personal care plan. Depending upon the condition, your plan could have included both over the counter or prescription medications. ? ?Please review your pharmacy choice. Make sure the pharmacy is open so you can pick up prescription now. If there is a problem, you may contact your provider through [employer] and have the prescription routed to another pharmacy.  Your safety is important to [place]. If you have drug allergies check your prescription carefully.  ? ?For the next 24 hours you can use MyChart to ask questions about today's visit, request a non-urgent call back, or ask for a work or school excuse. ?You will get an email in the next two days asking about your experience. I hope that your e-visit has been valuable and will speed your recovery. ? ?Approximately 5 minutes was spent  documenting and reviewing patient's chart.  ? ? ?

## 2021-08-21 ENCOUNTER — Telehealth: Payer: Self-pay | Admitting: Physician Assistant

## 2021-08-21 DIAGNOSIS — B9689 Other specified bacterial agents as the cause of diseases classified elsewhere: Secondary | ICD-10-CM

## 2021-08-21 DIAGNOSIS — J019 Acute sinusitis, unspecified: Secondary | ICD-10-CM

## 2021-08-21 DIAGNOSIS — R3989 Other symptoms and signs involving the genitourinary system: Secondary | ICD-10-CM

## 2021-08-21 MED ORDER — AMOXICILLIN-POT CLAVULANATE 875-125 MG PO TABS
1.0000 | ORAL_TABLET | Freq: Two times a day (BID) | ORAL | 0 refills | Status: DC
Start: 2021-08-21 — End: 2021-09-04

## 2021-08-21 MED ORDER — PSEUDOEPH-BROMPHEN-DM 30-2-10 MG/5ML PO SYRP
5.0000 mL | ORAL_SOLUTION | Freq: Four times a day (QID) | ORAL | 0 refills | Status: DC | PRN
Start: 2021-08-21 — End: 2021-12-23

## 2021-08-21 NOTE — Progress Notes (Signed)
?Virtual Visit Consent  ? ?Walden Field, you are scheduled for a virtual visit with a [facility] provider today. Just as with appointments in the office, your consent must be obtained to participate. Your consent will be active for this visit and any virtual visit you may have with one of our providers in the next 365 days. If you have a MyChart account, a copy of this consent can be sent to you electronically. ? ?As this is a virtual visit, video technology does not allow for your provider to perform a traditional examination. This may limit your provider's ability to fully assess your condition. If your provider identifies any concerns that need to be evaluated in person or the need to arrange testing (such as labs, EKG, etc.), we will make arrangements to do so. Although advances in technology are sophisticated, we cannot ensure that it will always work on either your end or our end. If the connection with a video visit is poor, the visit may have to be switched to a telephone visit. With either a video or telephone visit, we are not always able to ensure that we have a secure connection. ? ?By engaging in this virtual visit, you consent to the provision of healthcare and authorize for your insurance to be billed (if applicable) for the services provided during this visit. Depending on your insurance coverage, you may receive a charge related to this service. ? ?I need to obtain your verbal consent now. Are you willing to proceed with your visit today? Alisha Hughes has provided verbal consent on 08/21/2021 for a virtual visit (video or telephone). Mar Daring, PA-C ? ?Date: 08/21/2021 11:40 AM ? ?Virtual Visit via Video Note  ? ?Alisha Hughes, connected with  Alisha Hughes  ([id number], March 06, 1981) on 08/21/21 at 11:30 AM EDT by a video-enabled telemedicine application and verified that I am speaking with the correct person using two identifiers. ? ?Location: ?Patient: Virtual Visit Location  Patient: Home ?Provider: Virtual Visit Location Provider: Home Office ?  ?I discussed the limitations of evaluation and management by telemedicine and the availability of in person appointments. The patient expressed understanding and agreed to proceed.   ? ?History of Present Illness: ?Alisha Hughes is a 41 y.o. who identifies as a female who was assigned female at birth, and is being seen today for possible sinus infection and UTI. ? ?HPI: Sinusitis ?This is a new problem. The current episode started 1 to 4 weeks ago. The problem is unchanged. There has been no fever. The pain is moderate. Associated symptoms include congestion, coughing, headaches, sinus pressure and a sore throat. Pertinent negatives include no chills, ear pain or hoarse voice. Past treatments include oral decongestants and spray decongestants (flonase and zyrtec). The treatment provided moderate relief.  ?Urinary Tract Infection  ?This is a new problem. The current episode started yesterday. The problem occurs every urination. The problem has been gradually worsening. The quality of the pain is described as burning. The pain is mild. There has been no fever. Associated symptoms include frequency, hesitancy and urgency. Pertinent negatives include no chills, discharge, flank pain, hematuria, nausea or vomiting. She has tried increased fluids for the symptoms. The treatment provided no relief. Her past medical history is significant for recurrent UTIs.   ? ? ?Problems:  ?Patient Active Problem List  ? Diagnosis Date Noted  ? Hyperlipidemia 02/03/2018  ? 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 Pain in right knee 09/29/2017  ?  Overweight 08/12/2017  ? Irritable bowel syndrome with diarrhea 07/15/2016  ? Vitamin D deficiency 07/15/2016  ? Anxiety and depression 02/11/2016  ? Essential hypertension 02/08/2013  ? Chronic migraine without aura 12/07/2012  ?  ?Allergies:  ?Allergies  ?Allergen Reactions  ? Other   ?  Surgical glue causes skin to  be red and blotchy  ? Latex Rash  ? Metoclopramide Nausea And Vomiting  ?   ?Dizziness  ? Sertraline Rash  ? ?Medications:  ?Current Outpatient Medications:  ?  amoxicillin-clavulanate (AUGMENTIN) 875-125 MG tablet, Take 1 tablet by mouth 2 (two) times daily., Disp: 20 tablet, Rfl: 0 ?  brompheniramine-pseudoephedrine-DM 30-2-10 MG/5ML syrup, Take 5 mLs by mouth 4 (four) times daily as needed., Disp: 120 mL, Rfl: 0 ?  amLODipine (NORVASC) 10 MG tablet, Take 1 tablet (10 mg total) by mouth daily., Disp: 90 tablet, Rfl: 3 ?  buPROPion (WELLBUTRIN XL) 150 MG 24 hr tablet, Take 1 tablet (150 mg total) by mouth daily., Disp: 90 tablet, Rfl: 1 ?  cetirizine (ZYRTEC ALLERGY) 10 MG tablet, Take 1 tablet (10 mg total) by mouth daily., Disp: 90 tablet, Rfl: 1 ?  estradiol (VIVELLE-DOT) 0.05 MG/24HR patch, Place 1 patch (0.05 mg total) onto the skin 2 (two) times a week., Disp: 8 patch, Rfl: 12 ?  fluticasone (FLONASE) 50 MCG/ACT nasal spray, Place 2 sprays into both nostrils daily., Disp: 16 g, Rfl: 6 ?  lisinopril-hydrochlorothiazide (ZESTORETIC) 20-25 MG tablet, Take 1 tablet by mouth daily., Disp: 90 tablet, Rfl: 3 ?  ondansetron (ZOFRAN) 4 MG tablet, Take 1 tablet (4 mg total) by mouth every 8 (eight) hours as needed for nausea or vomiting., Disp: 30 tablet, Rfl: 0 ?  tirzepatide (MOUNJARO) 2.5 MG/0.5ML Pen, Inject 2.5 mg into the skin once a week., Disp: 2 mL, Rfl: 0 ? ?Observations/Objective: ?Patient is well-developed, well-nourished in no acute distress.  ?Resting comfortably at home.  ?Head is normocephalic, atraumatic.  ?No labored breathing.  ?Speech is clear and coherent with logical content.  ?Patient is alert and oriented at baseline.  ? ? ?Assessment and Plan: ?1. Acute bacterial sinusitis ?- amoxicillin-clavulanate (AUGMENTIN) 875-125 MG tablet; Take 1 tablet by mouth 2 (two) times daily.  Dispense: 20 tablet; Refill: 0 ?- brompheniramine-pseudoephedrine-DM 30-2-10 MG/5ML syrup; Take 5 mLs by mouth 4 (four)  times daily as needed.  Dispense: 120 mL; Refill: 0 ? ?2. Suspected UTI ?- amoxicillin-clavulanate (AUGMENTIN) 875-125 MG tablet; Take 1 tablet by mouth 2 (two) times daily.  Dispense: 20 tablet; Refill: 0 ? ?- Worsening symptoms that have not responded to OTC medications.  ?- Will give Augmentin ?- Continue allergy medications.  ?- Steam and humidifier can help ?- Stay well hydrated and get plenty of rest.  ?- Seek in person evaluation if no symptom improvement or if symptoms worsen ? ? ?Follow Up Instructions: ?I discussed the assessment and treatment plan with the patient. The patient was provided an opportunity to ask questions and all were answered. The patient agreed with the plan and demonstrated an understanding of the instructions.  A copy of instructions were sent to the patient via MyChart unless otherwise noted below.  ? ? ?The patient was advised to call back or seek an in-person evaluation if the symptoms worsen or if the condition fails to improve as anticipated. ? ?Time:  ?I spent 10 minutes with the patient via telehealth technology discussing the above problems/concerns.   ? ?Mar Daring, PA-C ?

## 2021-08-21 NOTE — Patient Instructions (Signed)
?Walden Field, thank you for joining Mar Daring, PA-C for today's virtual visit.  While this provider is not your primary care provider (PCP), if your PCP is located in our provider database this encounter information will be shared with them immediately following your visit. ? ?Consent: ?(Patient) Alisha Hughes provided verbal consent for this virtual visit at the beginning of the encounter. ? ?Current Medications: ? ?Current Outpatient Medications:  ?  amoxicillin-clavulanate (AUGMENTIN) 875-125 MG tablet, Take 1 tablet by mouth 2 (two) times daily., Disp: 20 tablet, Rfl: 0 ?  brompheniramine-pseudoephedrine-DM 30-2-10 MG/5ML syrup, Take 5 mLs by mouth 4 (four) times daily as needed., Disp: 120 mL, Rfl: 0 ?  amLODipine (NORVASC) 10 MG tablet, Take 1 tablet (10 mg total) by mouth daily., Disp: 90 tablet, Rfl: 3 ?  buPROPion (WELLBUTRIN XL) 150 MG 24 hr tablet, Take 1 tablet (150 mg total) by mouth daily., Disp: 90 tablet, Rfl: 1 ?  cetirizine (ZYRTEC ALLERGY) 10 MG tablet, Take 1 tablet (10 mg total) by mouth daily., Disp: 90 tablet, Rfl: 1 ?  estradiol (VIVELLE-DOT) 0.05 MG/24HR patch, Place 1 patch (0.05 mg total) onto the skin 2 (two) times a week., Disp: 8 patch, Rfl: 12 ?  fluticasone (FLONASE) 50 MCG/ACT nasal spray, Place 2 sprays into both nostrils daily., Disp: 16 g, Rfl: 6 ?  lisinopril-hydrochlorothiazide (ZESTORETIC) 20-25 MG tablet, Take 1 tablet by mouth daily., Disp: 90 tablet, Rfl: 3 ?  ondansetron (ZOFRAN) 4 MG tablet, Take 1 tablet (4 mg total) by mouth every 8 (eight) hours as needed for nausea or vomiting., Disp: 30 tablet, Rfl: 0 ?  tirzepatide (MOUNJARO) 2.5 MG/0.5ML Pen, Inject 2.5 mg into the skin once a week., Disp: 2 mL, Rfl: 0  ? ?Medications ordered in this encounter:  ?Meds ordered this encounter  ?Medications  ? amoxicillin-clavulanate (AUGMENTIN) 875-125 MG tablet  ?  Sig: Take 1 tablet by mouth 2 (two) times daily.  ?  Dispense:  20 tablet  ?  Refill:  0  ?  Order  Specific Question:   Supervising Provider  ?  Answer:   Noemi Chapel [[id number]]  ? brompheniramine-pseudoephedrine-DM 30-2-10 MG/5ML syrup  ?  Sig: Take 5 mLs by mouth 4 (four) times daily as needed.  ?  Dispense:  120 mL  ?  Refill:  0  ?  Order Specific Question:   Supervising Provider  ?  Answer:   Noemi Chapel [[id number]]  ?  ? ?*If you need refills on other medications prior to your next appointment, please contact your pharmacy* ? ?Follow-Up: ?Call back or seek an in-person evaluation if the symptoms worsen or if the condition fails to improve as anticipated. ? ?Other Instructions ? ?Urinary Tract Infection, Adult ?A urinary tract infection (UTI) is an infection of any part of the urinary tract. The urinary tract includes: ?The kidneys. ?The ureters. ?The bladder. ?The urethra. ?These organs make, store, and get rid of pee (urine) in the body. ?What are the causes? ?This infection is caused by germs (bacteria) in your genital area. These germs grow and cause swelling (inflammation) of your urinary tract. ?What increases the risk? ?The following factors may make you more likely to develop this condition: ?Using a small, thin tube (catheter) to drain pee. ?Not being able to control when you pee or poop (incontinence). ?Being female. If you are female, these things can increase the risk: ?Using these methods to prevent pregnancy: ?A medicine that kills sperm (spermicide). ?A device that blocks sperm (  diaphragm). ?Having low levels of a female hormone (estrogen). ?Being pregnant. ?You are more likely to develop this condition if: ?You have genes that add to your risk. ?You are sexually active. ?You take antibiotic medicines. ?You have trouble peeing because of: ?A prostate that is bigger than normal, if you are female. ?A blockage in the part of your body that drains pee from the bladder. ?A kidney stone. ?A nerve condition that affects your bladder. ?Not getting enough to drink. ?Not peeing often enough. ?You have other  conditions, such as: ?Diabetes. ?A weak disease-fighting system (immune system). ?Sickle cell disease. ?Gout. ?Injury of the spine. ?What are the signs or symptoms? ?Symptoms of this condition include: ?Needing to pee right away. ?Peeing small amounts often. ?Pain or burning when peeing. ?Blood in the pee. ?Pee that smells bad or not like normal. ?Trouble peeing. ?Pee that is cloudy. ?Fluid coming from the vagina, if you are female. ?Pain in the belly or lower back. ?Other symptoms include: ?Vomiting. ?Not feeling hungry. ?Feeling mixed up (confused). This may be the first symptom in older adults. ?Being tired and grouchy (irritable). ?A fever. ?Watery poop (diarrhea). ?How is this treated? ?Taking antibiotic medicine. ?Taking other medicines. ?Drinking enough water. ?In some cases, you may need to see a specialist. ?Follow these instructions at home: ? ?Medicines ?Take over-the-counter and prescription medicines only as told by your doctor. ?If you were prescribed an antibiotic medicine, take it as told by your doctor. Do not stop taking it even if you start to feel better. ?General instructions ?Make sure you: ?Pee until your bladder is empty. ?Do not hold pee for a long time. ?Empty your bladder after sex. ?Wipe from front to back after peeing or pooping if you are a female. Use each tissue one time when you wipe. ?Drink enough fluid to keep your pee pale yellow. ?Keep all follow-up visits. ?Contact a doctor if: ?You do not get better after 1-2 days. ?Your symptoms go away and then come back. ?Get help right away if: ?You have very bad back pain. ?You have very bad pain in your lower belly. ?You have a fever. ?You have chills. ?You feeling like you will vomit or you vomit. ?Summary ?A urinary tract infection (UTI) is an infection of any part of the urinary tract. ?This condition is caused by germs in your genital area. ?There are many risk factors for a UTI. ?Treatment includes antibiotic medicines. ?Drink enough  fluid to keep your pee pale yellow. ?This information is not intended to replace advice given to you by your health care provider. Make sure you discuss any questions you have with your health care provider. ?Document Revised: 11/11/2019 Document Reviewed: 11/11/2019 ?Elsevier Patient Education ? [place]. ? ? ?Sinus Infection, Adult ?A sinus infection is soreness and swelling (inflammation) of your sinuses. Sinuses are hollow spaces in the bones around your face. They are located: ?Around your eyes. ?In the middle of your forehead. ?Behind your nose. ?In your cheekbones. ?Your sinuses and nasal passages are lined with a fluid called mucus. Mucus drains out of your sinuses. Swelling can trap mucus in your sinuses. This lets germs (bacteria, virus, or fungus) grow, which leads to infection. Most of the time, this condition is caused by a virus. ?What are the causes? ?Allergies. ?Asthma. ?Germs. ?Things that block your nose or sinuses. ?Growths in the nose (nasal polyps). ?Chemicals or irritants in the air. ?A fungus. This is rare. ?What increases the risk? ?Having a weak body  defense system (immune system). ?Doing a lot of swimming or diving. ?Using nasal sprays too much. ?Smoking. ?What are the signs or symptoms? ?The main symptoms of this condition are pain and a feeling of pressure around the sinuses. Other symptoms include: ?Stuffy nose (congestion). This may make it hard to breathe through your nose. ?Runny nose (drainage). ?Soreness, swelling, and warmth in the sinuses. ?A cough that may get worse at night. ?Being unable to smell and taste. ?Mucus that collects in the throat or the back of the nose (postnasal drip). This may cause a sore throat or bad breath. ?Being very tired (fatigued). ?A fever. ?How is this diagnosed? ?Your symptoms. ?Your medical history. ?A physical exam. ?Tests to find out if your condition is short-term (acute) or long-term (chronic). Your doctor may: ?Check your nose for  growths (polyps). ?Check your sinuses using a tool that has a light on one end (endoscope). ?Check for allergies or germs. ?Do imaging tests, such as an MRI or CT scan. ?How is this treated? ?Treatment for this

## 2021-09-04 ENCOUNTER — Telehealth: Payer: Self-pay | Admitting: Nurse Practitioner

## 2021-09-04 ENCOUNTER — Telehealth: Payer: Self-pay

## 2021-09-04 ENCOUNTER — Telehealth: Payer: Self-pay | Admitting: Physician Assistant

## 2021-09-04 DIAGNOSIS — R3989 Other symptoms and signs involving the genitourinary system: Secondary | ICD-10-CM

## 2021-09-04 DIAGNOSIS — R300 Dysuria: Secondary | ICD-10-CM

## 2021-09-04 MED ORDER — SULFAMETHOXAZOLE-TRIMETHOPRIM 800-160 MG PO TABS: 1.0000 | ORAL_TABLET | Freq: Two times a day (BID) | ORAL | 0 refills | Status: DC

## 2021-09-04 NOTE — Progress Notes (Signed)
Based on what you shared with me it looks like you have Uti associated with back pain.,that should be evaluated in a face to face office visit. You need a urinalysis and urine culture fro proper treatment.  NOTE: There will be NO CHARGE for this eVisit   If you are having a true medical emergency please call 911.      For an urgent face to face visit, [facility] has six urgent care centers for your convenience:     [facility] [place] Get Driving Directions [phone] [address] [place], [place] [address]    [facility]) Get Driving Directions [phone] [place], [place] [address]  [facility] ([place]) Get Driving Directions [phone] [address] [place],  [place]  [address]  [facility]  Get Driving Directions [phone] [address], [place], [place] [address]   [facility] [place] Get Driving Directions  [phone] [address] [place], [place] [address]   [facility] [place] Get Driving Directions [phone] [address] [place],  [address]  Your MyChart E-visit questionnaire answers were reviewed by a board certified advanced clinical practitioner to complete your personal care plan based on your specific symptoms.  Thank you for using e-Visits.

## 2021-09-04 NOTE — Progress Notes (Signed)
Virtual Visit Consent   Alisha Hughes, you are scheduled for a virtual visit with a [facility] provider today. Just as with appointments in the office, your consent must be obtained to participate. Your consent will be active for this visit and any virtual visit you may have with one of our providers in the next 365 days. If you have a MyChart account, a copy of this consent can be sent to you electronically.  As this is a virtual visit, video technology does not allow for your provider to perform a traditional examination. This may limit your provider's ability to fully assess your condition. If your provider identifies any concerns that need to be evaluated in person or the need to arrange testing (such as labs, EKG, etc.), we will make arrangements to do so. Although advances in technology are sophisticated, we cannot ensure that it will always work on either your end or our end. If the connection with a video visit is poor, the visit may have to be switched to a telephone visit. With either a video or telephone visit, we are not always able to ensure that we have a secure connection.  By engaging in this virtual visit, you consent to the provision of healthcare and authorize for your insurance to be billed (if applicable) for the services provided during this visit. Depending on your insurance coverage, you may receive a charge related to this service.  I need to obtain your verbal consent now. Are you willing to proceed with your visit today? Alisha Hughes has provided verbal consent on 09/04/2021 for a virtual visit (video or telephone). Leeanne Rio, [place]  Date: 09/04/2021 5:53 PM  Virtual Visit via Video Note   I, Leeanne Rio, connected with  Alisha Hughes  ([id number], 06-Aug-1980) on 09/04/21 at  5:45 PM EDT by a video-enabled telemedicine application and verified that I am speaking with the correct person using two identifiers.  Location: Patient: Virtual Visit Location  Patient: Home Provider: Virtual Visit Location Provider: Home Office   I discussed the limitations of evaluation and management by telemedicine and the availability of in person appointments. The patient expressed understanding and agreed to proceed.    History of Present Illness: Alisha Hughes is a 41 y.o. who identifies as a female who was assigned female at birth, and is being seen today for recurrence of UTI symptoms. Notes she had some minor symptoms a few weeks ago but was also dealing with a sinus infection and was on Augmentin for this which she was told may help with urinary symptoms. Notes symptoms resolved on that antibiotic but in past 24 hours noting recurrence of urinary urgency and frequency along with dysuria. Notes lower back pressure. Denies back or belly pain, fever, chills, nausea or vomiting. Denies concern for pregnancy or STI. Denies vaginal symptoms.   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, Disp: 10 tablet, Rfl: 0   amLODipine (NORVASC) 10 MG tablet, Take 1 tablet (10 mg total) by mouth daily., Disp: 90 tablet, Rfl: 3   brompheniramine-pseudoephedrine-DM 30-2-10 MG/5ML syrup, Take 5 mLs by mouth 4 (four) times daily as needed., Disp: 120 mL, Rfl: 0   buPROPion  (WELLBUTRIN XL) 150 MG 24 hr tablet, Take 1 tablet (150 mg total) by mouth daily., Disp: 90 tablet, Rfl: 1   cetirizine (ZYRTEC ALLERGY) 10 MG tablet, Take 1 tablet (10 mg total) by mouth daily., Disp: 90 tablet, Rfl: 1   estradiol (VIVELLE-DOT) 0.05 MG/24HR patch, Place 1 patch (0.05 mg total) onto the skin 2 (two) times a week., Disp: 8 patch, Rfl: 12   fluticasone (FLONASE) 50 MCG/ACT nasal spray, Place 2 sprays into both nostrils daily., Disp: 16 g, Rfl: 6   lisinopril-hydrochlorothiazide (ZESTORETIC) 20-25 MG tablet, Take 1 tablet by mouth daily., Disp: 90 tablet, Rfl: 3   ondansetron (ZOFRAN) 4 MG tablet, Take 1 tablet (4 mg total) by mouth every 8 (eight) hours as needed for nausea or vomiting., Disp: 30 tablet, Rfl: 0   tirzepatide (MOUNJARO) 2.5 MG/0.5ML Pen, Inject 2.5 mg into the skin once a week., Disp: 2 mL, Rfl: 0  Observations/Objective: Patient is well-developed, well-nourished in no acute distress.  Resting comfortably in parked car. Head is normocephalic, atraumatic.  No labored breathing. Speech is clear and coherent with logical content.  Patient is alert and oriented at baseline.   Assessment and Plan: 1. Dysuria - sulfamethoxazole-trimethoprim (BACTRIM DS) 800-160 MG tablet; Take 1 tablet by mouth 2 (two) times daily.  Dispense: 10 tablet; Refill: 0  Start Bactrim BID x 5 days. Follow-up with PCP discussed. Increase fluids. Avoid caffeine. Start daily probiotic.   Follow Up Instructions: I discussed the assessment and treatment plan with the patient. The patient was provided an opportunity to ask questions and all were answered. The patient agreed with the plan and demonstrated an understanding of the instructions.  A copy of instructions were sent to the patient via MyChart unless otherwise noted below.   The patient was advised to call back or seek an in-person evaluation if the symptoms worsen or if the condition fails to improve as anticipated.  Time:  I spent 10  minutes with the patient via telehealth technology discussing the above problems/concerns.    Leeanne Rio, PA-C

## 2021-09-04 NOTE — Patient Instructions (Addendum)
Walden Field, thank you for joining Leeanne Rio, PA-C for today's virtual visit.  While this provider is not your primary care provider (PCP), if your PCP is located in our provider database this encounter information will be shared with them immediately following your visit.  Consent: (Patient) Alisha Hughes provided verbal consent for this virtual visit at the beginning of the encounter.  Current Medications:  Current Outpatient Medications:    amLODipine (NORVASC) 10 MG tablet, Take 1 tablet (10 mg total) by mouth daily., Disp: 90 tablet, Rfl: 3   amoxicillin-clavulanate (AUGMENTIN) 875-125 MG tablet, Take 1 tablet by mouth 2 (two) times daily., Disp: 20 tablet, Rfl: 0   brompheniramine-pseudoephedrine-DM 30-2-10 MG/5ML syrup, Take 5 mLs by mouth 4 (four) times daily as needed., Disp: 120 mL, Rfl: 0   buPROPion (WELLBUTRIN XL) 150 MG 24 hr tablet, Take 1 tablet (150 mg total) by mouth daily., Disp: 90 tablet, Rfl: 1   cetirizine (ZYRTEC ALLERGY) 10 MG tablet, Take 1 tablet (10 mg total) by mouth daily., Disp: 90 tablet, Rfl: 1   estradiol (VIVELLE-DOT) 0.05 MG/24HR patch, Place 1 patch (0.05 mg total) onto the skin 2 (two) times a week., Disp: 8 patch, Rfl: 12   fluticasone (FLONASE) 50 MCG/ACT nasal spray, Place 2 sprays into both nostrils daily., Disp: 16 g, Rfl: 6   lisinopril-hydrochlorothiazide (ZESTORETIC) 20-25 MG tablet, Take 1 tablet by mouth daily., Disp: 90 tablet, Rfl: 3   ondansetron (ZOFRAN) 4 MG tablet, Take 1 tablet (4 mg total) by mouth every 8 (eight) hours as needed for nausea or vomiting., Disp: 30 tablet, Rfl: 0   tirzepatide (MOUNJARO) 2.5 MG/0.5ML Pen, Inject 2.5 mg into the skin once a week., Disp: 2 mL, Rfl: 0   Medications ordered in this encounter:  No orders of the defined types were placed in this encounter.    *If you need refills on other medications prior to your next appointment, please contact your pharmacy*  Follow-Up: Call back or seek an  in-person evaluation if the symptoms worsen or if the condition fails to improve as anticipated.  Other Instructions Your symptoms are consistent with a bladder infection, also called acute cystitis. Please take your antibiotic (Bactrim) as directed until all pills are gone.  Stay very well hydrated.  Consider a daily probiotic (Align, Culturelle, or Activia) to help prevent stomach upset caused by the antibiotic.  Taking a probiotic daily may also help prevent recurrent UTIs.   If not fully resolving or any new symptoms, you will have to have an in-person evaluation.  I do recommend going ahead and scheduling a follow-up with your PCP to discuss Urology.   Urinary Tract Infection A urinary tract infection (UTI) can occur any place along the urinary tract. The tract includes the kidneys, ureters, bladder, and urethra. A type of germ called bacteria often causes a UTI. UTIs are often helped with antibiotic medicine.  HOME CARE  If given, take antibiotics as told by your doctor. Finish them even if you start to feel better. Drink enough fluids to keep your pee (urine) clear or pale yellow. Avoid tea, drinks with caffeine, and bubbly (carbonated) drinks. Pee often. Avoid holding your pee in for a long time. Pee before and after having sex (intercourse). Wipe from front to back after you poop (bowel movement) if you are a woman. Use each tissue only once. GET HELP RIGHT AWAY IF:  You have back pain. You have lower belly (abdominal) pain. You have chills. You  feel sick to your stomach (nauseous). You throw up (vomit). Your burning or discomfort with peeing does not go away. You have a fever. Your symptoms are not better in 3 days. MAKE SURE YOU:  Understand these instructions. Will watch your condition. Will get help right away if you are not doing well or get worse. Document Released: 09/17/2007 Document Revised: 12/24/2011 Document Reviewed: 10/30/2011 [facility] Patient Information 2015  [place], [place]. This information is not intended to replace advice given to you by your health care provider. Make sure you discuss any questions you have with your health care provider.    If you have been instructed to have an in-person evaluation today at a local Urgent Care facility, please use the link below. It will take you to a list of all of our available [facility], including address, phone number and hours of operation. Please do not delay care.  [facility]  If you or a family member do not have a primary care provider, use the link below to schedule a visit and establish care. When you choose a [facility] primary care physician or advanced practice provider, you gain a long-term partner in health. Find a Primary Care Provider  Learn more about [facility]'s in-office and virtual care options: [facility]

## 2021-10-07 ENCOUNTER — Ambulatory Visit
Admission: EM | Admit: 2021-10-07 | Discharge: 2021-10-07 | Disposition: A | Discharge status: Home / Self Care | Payer: Self-pay | Attending: Emergency Medicine | Admitting: Emergency Medicine

## 2021-10-07 DIAGNOSIS — I10 Essential (primary) hypertension: Secondary | ICD-10-CM

## 2021-10-07 DIAGNOSIS — R1084 Generalized abdominal pain: Secondary | ICD-10-CM

## 2021-10-07 DIAGNOSIS — R197 Diarrhea, unspecified: Secondary | ICD-10-CM

## 2021-10-07 DIAGNOSIS — R110 Nausea: Secondary | ICD-10-CM

## 2021-10-07 MED ORDER — ONDANSETRON 4 MG PO TBDP: 4.0000 mg | ORAL_TABLET | Freq: Three times a day (TID) | ORAL | 0 refills | Status: DC | PRN

## 2021-10-07 MED ORDER — ONDANSETRON 4 MG PO TBDP
4.0000 mg | ORAL_TABLET | Freq: Once | ORAL | Status: AC
  Administered 2021-10-07: 4 mg via ORAL

## 2021-12-23 ENCOUNTER — Ambulatory Visit (INDEPENDENT_AMBULATORY_CARE_PROVIDER_SITE_OTHER): Payer: No Typology Code available for payment source | Admitting: Family

## 2021-12-23 ENCOUNTER — Encounter: Payer: Self-pay | Admitting: Family

## 2021-12-23 ENCOUNTER — Encounter: Payer: Self-pay | Admitting: Physician Assistant

## 2021-12-23 ENCOUNTER — Ambulatory Visit: Payer: Self-pay | Admitting: Family

## 2021-12-23 ENCOUNTER — Ambulatory Visit (INDEPENDENT_AMBULATORY_CARE_PROVIDER_SITE_OTHER)
Admission: RE | Admit: 2021-12-23 | Discharge: 2021-12-23 | Disposition: A | Discharge status: Home / Self Care | Payer: No Typology Code available for payment source | Source: Ambulatory Visit | Attending: Family | Admitting: Family

## 2021-12-23 VITALS — BP 129/97 | HR 80 | Temp 98.6°F | Resp 16 | Ht 63.0 in | Wt 152.4 lb

## 2021-12-23 DIAGNOSIS — R071 Chest pain on breathing: Secondary | ICD-10-CM

## 2021-12-23 DIAGNOSIS — I10 Essential (primary) hypertension: Secondary | ICD-10-CM | POA: Diagnosis not present

## 2021-12-23 DIAGNOSIS — R0602 Shortness of breath: Secondary | ICD-10-CM

## 2021-12-23 MED ORDER — LISINOPRIL 20 MG PO TABS
ORAL_TABLET | ORAL | 0 refills | Status: DC
Start: 2021-12-23 — End: 2021-12-24

## 2021-12-23 MED ORDER — LISINOPRIL 20 MG PO TABS: 20.0000 mg | ORAL_TABLET | Freq: Every day | ORAL | 0 refills | Status: DC

## 2021-12-23 NOTE — Telephone Encounter (Signed)
Caller is Barista, Location manager.  Caller states: - After speaking with pt about sx, she determined patient needs to go to the ED.  - Patient declined going to the ED.   Please Advise.

## 2021-12-23 NOTE — Telephone Encounter (Signed)
Informed PCP of patient declining to go to ED. PCP recommended patient go to ED or at least schedule an OV with either her or another provider.   I spoke with patient and after she declined going to ED once more, I was able to schedule her with Jeanie Sewer on 12/23/21 '@1pm'$ .

## 2021-12-23 NOTE — Progress Notes (Signed)
Established Patient Office Visit  Subjective:  Patient ID: Alisha Hughes, female    DOB: 06-Dec-1980  Age: 41 y.o. MRN: [id number]  CC:  Chief Complaint  Patient presents with  . Hypertension    Headache X 5 days nagging. Friday bp 185/130 been monitor.   . Shortness of Breath    This am when she woke up and back hurt.    HPI BRIE EPPARD is here today with concerns. Covid tested last week and was negative.   Last week with 'slamming' headache all last week.  Four days ago, her blood pressure was about 180/130.along with headache.  She takes amlodipine 10 mg once daily and also lisinopril hctz 20-25 mg Mom with h/o brain aneurysm.  This morning she woke up with sob and her back was in pain. She took her blood pressure this am and was 175/120. Pulse 88, 75, 90 on several occasions  Had hysterectomy back in October. She is on the estradiol patch.   Right now, blood pressure 129/97.  No urinary symptoms.   Does c/o chest pain on left upper chest. Feels tight right now, doesn't feel hard to breath. No cough.  No chest congestion, no wheezing.   Denies heartburn. No burping. No abdominal pain, no change in bowel movements.   Past Medical History:  Diagnosis Date  . Abnormal ECG 02/03/2018  . Anxiety and depression   . History of kidney stones   . HSV (herpes simplex virus) infection 02/10/2018  . Hyperlipidemia   . Hypertension     Past Surgical History:  Procedure Laterality Date  . ABDOMINAL HYSTERECTOMY    . CESAREAN SECTION    . CESAREAN SECTION N/A 12/19/2012   Procedure: Repeat cesarean section with delivery of baby girl at 24.    Bilateral tubal ligation with filshie clips.;  Surgeon: Donnamae Jude, MD;  Location: [facility];  Service: Obstetrics;  Laterality: N/A;  . CHOLECYSTECTOMY  2010  . COLONOSCOPY     2009  . COLPOSCOPY VULVA W/ BIOPSY     abnormal pap  . CYSTOSCOPY N/A 01/22/2021   Procedure: CYSTOSCOPY;  Surgeon: Aletha Halim, MD;  Location:  [facility];  Service: Gynecology;  Laterality: N/A;  . ENDOMETRIAL ABLATION N/A 07/17/2015   Procedure: ENDOMETRIAL ABLATION WITH HTA, DILATATION AND CURRATAGE;  Surgeon: Emily Filbert, MD;  Location: [facility];  Service: Gynecology;  Laterality: N/A;  . KNEE ARTHROSCOPY    . LAPAROSCOPIC LYSIS OF ADHESIONS  07/17/2015   Procedure: LAPAROSCOPIC LYSIS OF ADHESIONS;  Surgeon: Emily Filbert, MD;  Location: [facility];  Service: Gynecology;;  . LAPAROSCOPY N/A 07/17/2015   Procedure: LAPAROSCOPY DIAGNOSTIC;  Surgeon: Emily Filbert, MD;  Location: [facility];  Service: Gynecology;  Laterality: N/A;  . TOTAL LAPAROSCOPIC HYSTERECTOMY WITH BILATERAL SALPINGO OOPHORECTOMY Bilateral 01/22/2021   Procedure: TOTAL LAPAROSCOPIC HYSTERECTOMY WITH BILATERAL SALPINGO OOPHORECTOMY, OPEN VAGINAL  REPAIR OF VAGINAL CUFF;  Surgeon: Aletha Halim, MD;  Location: [facility];  Service: Gynecology;  Laterality: Bilateral;  . TUBAL LIGATION    . UPPER GI ENDOSCOPY    . WISDOM TOOTH EXTRACTION      Family History  Problem Relation Age of Onset  . Hypertension Mother   . Anuerysm Mother        brain  . Migraines Mother   . Heart disease Father   . Hypertension Father   . Hyperlipidemia Father   . Prostate cancer Father   . Depression Brother   . Multiple sclerosis  Maternal Grandmother   . Sudden death Maternal Grandmother   . Heart disease Maternal Grandfather   . Sudden death Maternal Grandfather   . Heart disease Paternal Grandmother   . Diabetes Paternal Grandmother   . Parkinson's disease Paternal Grandfather     Social History   Socioeconomic History  . Marital status: Married    Spouse name: Not on file  . Number of children: Not on file  . Years of education: Not on file  . Highest education level: Not on file  Occupational History  . Not on file  Tobacco Use  . Smoking status: Never  . Smokeless tobacco: Never  Vaping Use  . Vaping Use: Never used  Substance and Sexual Activity  . Alcohol use: Yes     Alcohol/week: 0.0 standard drinks of alcohol    Comment: rarely  . Drug use: No  . Sexual activity: Yes    Partners: Male    Birth control/protection: Surgical  Other Topics Concern  . 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 amLODipine (NORVASC) 10 MG tablet Take 1 tablet (10 mg total) by mouth daily. 90 tablet 3  . estradiol (VIVELLE-DOT) 0.05 MG/24HR patch Place 1 patch (0.05 mg total) onto the skin 2 (two) times a week. 8 patch 12  . lisinopril-hydrochlorothiazide (ZESTORETIC) 20-25 MG tablet Take 1 tablet by mouth daily. 90 tablet 3  . brompheniramine-pseudoephedrine-DM 30-2-10 MG/5ML syrup Take 5 mLs by mouth 4 (four) times daily as needed. (Patient not taking: Reported on 12/23/2021) 120 mL 0  . buPROPion (WELLBUTRIN XL) 150 MG 24 hr tablet Take 1 tablet (150 mg total) by mouth daily. (Patient not taking: Reported on 12/23/2021) 90 tablet 1  . cetirizine (ZYRTEC ALLERGY) 10 MG tablet Take 1 tablet (10 mg total) by mouth daily. (Patient not taking: Reported on 12/23/2021) 90 tablet 1  . fluticasone (FLONASE) 50 MCG/ACT nasal spray Place 2 sprays into both nostrils daily. (Patient not taking: Reported on 12/23/2021) 16 g 6  . ondansetron (ZOFRAN-ODT) 4 MG disintegrating tablet Take 1 tablet (4 mg total) by mouth every 8 (eight) hours as needed for nausea or vomiting. (Patient not taking: Reported on 12/23/2021) 20 tablet 0  .  sulfamethoxazole-trimethoprim (BACTRIM DS) 800-160 MG tablet Take 1 tablet by mouth 2 (two) times daily. (Patient not taking: Reported on 12/23/2021) 10 tablet 0  . tirzepatide (MOUNJARO) 2.5 MG/0.5ML Pen Inject 2.5 mg into the skin once a week. 2 mL 0   No facility-administered medications prior to visit.    Allergies  Allergen Reactions  . Other     Surgical glue causes skin to be red and blotchy  . Latex Rash  . Metoclopramide Nausea And Vomiting     Dizziness  . Sertraline Rash        Objective:    Physical Exam  BP (!) 129/97   Pulse  80   Temp 98.6 F (37 C)   Resp 16   Ht _0  (1.6 m)   Wt 152 lb 6 oz (69.1 kg)   LMP 12/14/2020 (Approximate)   SpO2 97%   BMI 26.99 kg/m  Wt Readings from Last 3 Encounters:  12/23/21 152 lb 6 oz (69.1 kg)  03/21/21 179 lb (81.2 kg)  03/18/21 179 lb 12.8 oz (81.6 kg)     Health Maintenance Due  Topic Date Due  . Hepatitis C Screening  Never done  . COVID-19 Vaccine (4 - Pfizer series) 05/09/2020  . INFLUENZA VACCINE  11/12/2021    There are no preventive care reminders to display for this patient.  Lab Results  Component Value Date   TSH 1.08 07/11/2020   Lab Results  Component Value Date   WBC 6.4 02/07/2021   HGB 14.5 02/07/2021   HCT 44.0 02/07/2021   MCV 84 02/07/2021   PLT 411 02/07/2021   Lab Results  Component Value Date   NA 139 02/07/2021   K 4.1 02/07/2021   CO2 24 02/07/2021   GLUCOSE 97 02/07/2021   BUN 8 02/07/2021   CREATININE 0.88 02/07/2021   BILITOT 0.3 02/07/2021   ALKPHOS 117 02/07/2021   AST 12 02/07/2021   ALT 9 02/07/2021   PROT 7.1 02/07/2021   ALBUMIN 4.3 02/07/2021   CALCIUM 9.2 02/07/2021   ANIONGAP 3 (L) 12/28/2020   EGFR 86 02/07/2021   GFR 97.95 07/11/2020   Lab Results  Component Value Date   HGBA1C 5.5 03/04/2019      Assessment & Plan:   Problem List Items Addressed This Visit   None   No orders of the defined types were placed in this  encounter.   Follow-up: No follow-ups on file.    Eugenia Pancoast, FNP

## 2021-12-23 NOTE — Telephone Encounter (Signed)
Patient Name: Alisha Hughes Gender: Female DOB: 1980/10/14 Age: 41 Y 17 M 23 D Return Phone Number: [phone] (Primary) Address: City/ State/ Zip: [place]  [address] Client [facility] Client Site [facility] Provider Morene Rankins, [place]- [place] Contact Type Call Who Is Calling Patient / Member / Family / Caregiver Call Type Triage / Clinical Relationship To Patient Self Return Phone Number [phone] (Primary) Chief Complaint BLOOD PRESSURE HIGH - Diastolic (bottom number) [phone] or greater Reason for Call Symptomatic / Request for [place] states, patient is having high blood pressure 176/120. Policy to have patient triaged. Now BP is 149/125. Has been having headaches. Now no more headaches. Translation No Nurse Assessment Nurse: Susy Manor, RN, Megan Date/Time (Eastern Time): 12/23/2021 8:37:53 AM Confirm and document reason for call. If symptomatic, describe symptoms. ---Caller states, patient has been having high blood pressure 176/120. Current BP is 149/125. She is refusing to go to ER. Caller states she woke up with SOB this morning. She is having back pain. She is having SOB just sitting. This has happened before. Does the patient have any new or worsening symptoms? ---Yes Will a triage be completed? ---Yes Related visit to physician within the last 2 weeks? ---No Does the PT have any chronic conditions? (i.e. diabetes, asthma, this includes High risk factors for pregnancy, etc.) ---Yes List chronic conditions. ---HTN Is the patient pregnant or possibly pregnant? (Ask all females between the ages of 81-55) ---No Is this a behavioral health or substance abuse call? ---No PLEASE NOTE: All timestamps contained within this report are represented as [place] Standard Time. CONFIDENTIALTY NOTICE: This fax transmission is intended only for the addressee. It contains  information that is legally privileged, confidential or otherwise protected from use or disclosure. If you are not the intended recipient, you are strictly prohibited from reviewing, disclosing, copying using or disseminating any of this information or taking any action in reliance on or regarding this information. If you have received this fax in error, please notify us immediately by telephone so that we can arrange for its return to [place]. Phone: [phone], Toll-Free: [phone], Fax: [phone] Page: 2 of 2 Call Id: [id number] Guidelines Guideline Title Affirmed Question Affirmed Notes Nurse Date/Time Eilene Ghazi Time) Breathing Difficulty [1] MODERATE difficulty breathing (e.g., speaks in phrases, SOB even at rest, pulse 100-120) AND [2] NEW-onset or WORSE than normal Susy Manor, RN, Megan 12/23/2021 8:40:12 AM Disp. Time Eilene Ghazi Time) Disposition Final User 12/23/2021 8:35:52 AM Send to Urgent Queue Jobie Quaker 12/23/2021 8:42:15 AM Go to ED Now Yes Susy Manor, RN, Megan Final Disposition 12/23/2021 8:42:15 AM Go to ED Now Yes Susy Manor, RN, Delphia Grates Disagree/Comply Disagree Caller Understands Yes PreDisposition Call Doctor Care Advice Given Per Guideline GO TO ED NOW: * You need to be seen in the Emergency Department. * Go to the ED at ___________ [place] given per Breathing Difficulty (Adult) guideline. Comments User: Sula Soda, RN Date/Time Eilene Ghazi Time): 12/23/2021 8:45:07 AM I called office backline and provided information in regards to pt and her requesting to make an appointment. Referrals REFERRED TO PCP OFFICE

## 2021-12-23 NOTE — Telephone Encounter (Signed)
Pt scheduled to be see this afternoon with Jeanie Sewer, NP

## 2021-12-23 NOTE — Telephone Encounter (Signed)
Pt states: -experiencing high blood pressure since the weekend (09/09-09/10). BP Reading: 176/120 on 12/23/21 AM  Transferred to PCP Triage Nurse: Awaiting follow up Notes.

## 2021-12-23 NOTE — Patient Instructions (Signed)
Increase lisinopril to 40 mg by adding on 20 mg lisinopril, and continue lisinopril-HCTZ (20-25) once daily.  Continue amlodipine 10 mg once daily.   If any worsening chest pain, sob or headache go to er immediately and or call 911.   Labs and chest xray today, pending results.   Due to recent changes in healthcare laws, you may see results of your imaging and/or laboratory studies on MyChart before I have had a chance to review them.  I understand that in some cases there may be results that are confusing or concerning to you. Please understand that not all results are received at the same time and often I may need to interpret multiple results in order to provide you with the best plan of care or course of treatment. Therefore, I ask that you please give me 2 business days to thoroughly review all your results before contacting my office for clarification. Should we see a critical lab result, you will be contacted sooner.   It was a pleasure seeing you today! Please do not hesitate to reach out with any questions and or concerns.  Regards,   Eugenia Pancoast FNP-C

## 2021-12-24 ENCOUNTER — Encounter: Payer: Self-pay | Admitting: Family

## 2021-12-24 ENCOUNTER — Emergency Department: Payer: No Typology Code available for payment source

## 2021-12-24 ENCOUNTER — Emergency Department
Admission: EM | Admit: 2021-12-24 | Discharge: 2021-12-24 | Disposition: A | Discharge status: Home / Self Care | Payer: No Typology Code available for payment source | Attending: Emergency Medicine | Admitting: Emergency Medicine

## 2021-12-24 DIAGNOSIS — I10 Essential (primary) hypertension: Secondary | ICD-10-CM | POA: Diagnosis not present

## 2021-12-24 DIAGNOSIS — Z79899 Other long term (current) drug therapy: Secondary | ICD-10-CM | POA: Insufficient documentation

## 2021-12-24 DIAGNOSIS — R0602 Shortness of breath: Secondary | ICD-10-CM | POA: Insufficient documentation

## 2021-12-24 DIAGNOSIS — R0789 Other chest pain: Secondary | ICD-10-CM | POA: Diagnosis present

## 2021-12-24 DIAGNOSIS — R0600 Dyspnea, unspecified: Secondary | ICD-10-CM | POA: Diagnosis not present

## 2021-12-24 DIAGNOSIS — R079 Chest pain, unspecified: Secondary | ICD-10-CM

## 2021-12-24 DIAGNOSIS — R071 Chest pain on breathing: Secondary | ICD-10-CM | POA: Insufficient documentation

## 2021-12-24 LAB — HEPATIC FUNCTION PANEL
ALT: 13 U/L (ref 0–44)
AST: 17 U/L (ref 15–41)
Albumin: 4.3 g/dL (ref 3.5–5.0)
Alkaline Phosphatase: 75 U/L (ref 38–126)
Bilirubin, Direct: <0.1 mg/dL (ref 0.0–0.2)
Total Bilirubin: 0.7 mg/dL (ref 0.3–1.2)
Total Protein: 7.5 g/dL (ref 6.5–8.1)

## 2021-12-24 LAB — CBC
HCT: 42.7 % (ref 36.0–46.0)
HCT: 48 % — ABNORMAL HIGH (ref 36.0–46.0)
Hemoglobin: 14.3 g/dL (ref 12.0–15.0)
Hemoglobin: 15.7 g/dL — ABNORMAL HIGH (ref 12.0–15.0)
MCH: 26.8 pg (ref 26.0–34.0)
MCHC: 33.5 g/dL (ref 30.0–36.0)
MCHC: 32.7 g/dL (ref 30.0–36.0)
MCV: 82.7 fl (ref 78.0–100.0)
MCV: 82.1 fL (ref 80.0–100.0)
Platelets: 286 10*3/uL (ref 150.0–400.0)
Platelets: 349 10*3/uL (ref 150–400)
RBC: 5.17 Mil/uL — ABNORMAL HIGH (ref 3.87–5.11)
RBC: 5.85 MIL/uL — ABNORMAL HIGH (ref 3.87–5.11)
RDW: 13.3 % (ref 11.5–15.5)
RDW: 12.6 % (ref 11.5–15.5)
WBC: 6.1 10*3/uL (ref 4.0–10.5)
WBC: 6.6 10*3/uL (ref 4.0–10.5)
nRBC: 0 % (ref 0.0–0.2)

## 2021-12-24 LAB — BASIC METABOLIC PANEL
Anion gap: 9 (ref 5–15)
BUN: 12 mg/dL (ref 6–20)
CO2: 26 mmol/L (ref 22–32)
Calcium: 9.3 mg/dL (ref 8.9–10.3)
Chloride: 105 mmol/L (ref 98–111)
Creatinine, Ser: 0.78 mg/dL (ref 0.44–1.00)
GFR, Estimated: >60 mL/min (ref >60)
Glucose, Bld: 89 mg/dL (ref 70–99)
Potassium: 3.7 mmol/L (ref 3.5–5.1)
Sodium: 140 mmol/L (ref 135–145)

## 2021-12-24 LAB — MAGNESIUM: Magnesium: 1.9 mg/dL (ref 1.7–2.4)

## 2021-12-24 LAB — COMPREHENSIVE METABOLIC PANEL
ALT: 10 U/L (ref 0–35)
AST: 16 U/L (ref 0–37)
Albumin: 4.2 g/dL (ref 3.5–5.2)
Alkaline Phosphatase: 79 U/L (ref 39–117)
BUN: 9 mg/dL (ref 6–23)
CO2: 28 mEq/L (ref 19–32)
Calcium: 9.6 mg/dL (ref 8.4–10.5)
Chloride: 103 mEq/L (ref 96–112)
Creatinine, Ser: 0.79 mg/dL (ref 0.40–1.20)
GFR: 94.02 mL/min (ref >60.00)
Glucose, Bld: 69 mg/dL — ABNORMAL LOW (ref 70–99)
Potassium: 4.2 mEq/L (ref 3.5–5.1)
Sodium: 140 mEq/L (ref 135–145)
Total Bilirubin: 0.6 mg/dL (ref 0.2–1.2)
Total Protein: 7.2 g/dL (ref 6.0–8.3)

## 2021-12-24 LAB — TROPONIN I (HIGH SENSITIVITY): Troponin I (High Sensitivity): 2 ng/L (ref <18)

## 2021-12-24 LAB — TSH: TSH: 0.92 u[IU]/mL (ref 0.35–5.50)

## 2021-12-24 MED ORDER — ASPIRIN 81 MG PO CHEW
324.0000 mg | CHEWABLE_TABLET | Freq: Once | ORAL | Status: AC
  Administered 2021-12-24: 324 mg via ORAL
  Filled 2021-12-24: qty 4

## 2021-12-24 MED ORDER — HYDROCHLOROTHIAZIDE 25 MG PO TABS: 25.0000 mg | ORAL_TABLET | Freq: Every day | ORAL | 0 refills | Status: DC

## 2021-12-24 MED ORDER — LISINOPRIL 40 MG PO TABS: 40.0000 mg | ORAL_TABLET | Freq: Every day | ORAL | 0 refills | Status: DC

## 2021-12-24 MED ORDER — IOHEXOL 350 MG/ML SOLN
75.0000 mL | Freq: Once | INTRAVENOUS | Status: AC | PRN
  Administered 2021-12-24: 100 mL via INTRAVENOUS

## 2021-12-24 MED ORDER — METOPROLOL TARTRATE 25 MG PO TABS: 25.0000 mg | ORAL_TABLET | Freq: Every day | ORAL | 0 refills | Status: DC

## 2021-12-24 MED ORDER — NITROGLYCERIN 2 % TD OINT
1.0000 [in_us] | TOPICAL_OINTMENT | Freq: Once | TRANSDERMAL | Status: AC
  Administered 2021-12-24: 1 [in_us] via TOPICAL
  Filled 2021-12-24: qty 1

## 2021-12-24 NOTE — Discharge Instructions (Signed)
Your lisinopril and hydrochlorothiazide has been submitted to the pharmacy.  No combination tablet is available and therefore you will have 2 tablets.  You have also been prescribed metoprolol.  You should not take all of your blood pressure medications at 1 time.  I recommend taking the lisinopril hydrochlorothiazide in the morning and metoprolol at night.  Keep your upcoming appointment with pulmonology and follow-up with cardiology.   If at any point your shortness of breath or chest pain gets worse or you develop other symptoms such as dizziness, blurred vision please return to the emergency department.

## 2021-12-24 NOTE — ED Triage Notes (Signed)
Pt arrives with c/o chest pain that started 2 days ago. Pt endorses SOB and HTN.

## 2021-12-24 NOTE — Assessment & Plan Note (Signed)
rx sent for lisinopril 20 mg .  Pt to take along with amlodipine 10 mg as well as lisinopril hctz 20/25 mg for total of lisinopril 40 mg once daily.  Start monitoring your blood pressure daily, around the same time of day, for the next 2-3 weeks.  Ensure that you have rested for 30 minutes prior to checking your blood pressure. Record your readings and bring them to your next visit.  Will also refer pt to cardiologist for ongoing resistant hypertension.

## 2021-12-24 NOTE — Assessment & Plan Note (Signed)
cxr today however advised pt if any worsening sob or chest pain, immediately go to er and or call 911.

## 2021-12-24 NOTE — ED Provider Notes (Signed)
[facility] Provider Note    Event Date/Time   First MD Initiated Contact with Patient 12/24/21 1139     (approximate)   History   Chest Pain   HPI  Alisha Hughes is a 41 y.o. female  with history of hypertension and as listed in EMR presents to the emergency department for treatment and evaluation of hypertension, chest pain, and shortness of breath.  Symptoms started yesterday and were worse this morning.  Pressure in her chest gets worse with deep breath.  She has had symptoms like this frequently over the past several months but this morning she felt worse than usual and decided to come to the emergency department.  She had been evaluated by her primary care provider yesterday and her lisinopril was increased to 40 mg.  Despite the increase in dosing, blood pressure remains significantly elevated.  She checked her oxygen saturation at home and it was varying between 98% and 84%.  She decided to come to the emergency department for further evaluation.      Physical Exam   Triage Vital Signs: ED Triage Vitals [12/24/21 1132]  Enc Vitals Group     BP (!) 155/118     Pulse Rate 91     Resp 19     Temp 97.8 F (36.6 C)     Temp src      SpO2 100 %     Weight 152 lb (68.9 kg)     Height      Head Circumference      Peak Flow      Pain Score 7     Pain Loc      Pain Edu?      Excl. in [place]?     Most recent vital signs: Vitals:   12/24/21 1230 12/24/21 1341  BP: (!) 139/105 (!) 138/102  Pulse: 77 79  Resp: 19 (!) 22  Temp:  98 F (36.7 C)  SpO2: 100% 100%    General: Awake, no distress.  CV:  Good peripheral perfusion.  Resp:  Normal effort. Breath sounds clear. Abd:  No distention. No tenderness Other:     ED Results / Procedures / Treatments   Labs (all labs ordered are listed, but only abnormal results are displayed) Labs Reviewed  CBC - Abnormal; Notable for the following components:      Result Value   RBC 5.85 (*)     Hemoglobin 15.7 (*)    HCT 48.0 (*)    All other components within normal limits  BASIC METABOLIC PANEL  HEPATIC FUNCTION PANEL  MAGNESIUM  TROPONIN I (HIGH SENSITIVITY)  TROPONIN I (HIGH SENSITIVITY)     EKG  Sinus rhythm, rate of 65, non-specific T wave abnormalities of lateral leads.   RADIOLOGY  Image interpreted and radiology report reviewed by me.  CTA chest for PE negative for acute concerns.  PROCEDURES:  Critical Care performed: No  Procedures   MEDICATIONS ORDERED IN ED: Medications  nitroGLYCERIN (NITROGLYN) 2 % ointment 1 inch (1 inch Topical Given 12/24/21 1205)  aspirin chewable tablet 324 mg (324 mg Oral Given 12/24/21 1204)  iohexol (OMNIPAQUE) 350 MG/ML injection 75 mL (100 mLs Intravenous Contrast Given 12/24/21 1237)     IMPRESSION / MDM / ASSESSMENT AND PLAN / ED COURSE   I have reviewed the triage note.  Differential diagnosis includes, but is not limited to, PE, cardiac event, symptomatic uncontrolled hypertension   Clinical Course as of 12/24/21 1631  Tue Dec 24, 2021  1230 Chest pressure still comes and goes, but is less than before NTG paste applied. BP gradually decreasing. Troponin is normal. [CT]  1321 CT chest for PE negative.  [CT]    Clinical Course User Index [CT] Thomasena Vandenheuvel B, FNP   I question anxiety as underlying cause of chest pressure, however patient states she does not feel anxious. No relief of symptoms with antianxiety medications in the past.   Labs and CT angio for PE are all reassuring.  Case discussed with ED attending.  Plan for discharge discussed with the patient and her husband. Plan will be to add metoprolol 25 mg daily to patient's current antihypertensives.  Patient states that she is having trouble getting the hydrochlorothiazide HCTZ plus an additional hydrochlorothiazide due to insurance restrictions.  Medications prescribed individually as there is no combination tablet in the 40/25.  Symptoms have  decreased as blood pressure has decreased.   Patient has a scheduled follow-up with pulmonology tomorrow.  She was encouraged to keep this appointment as we are unsure why her hypertension is so resistant to treatment.  We discussed secondary causes and potential testing including renal ultrasound and sleep study.  We also discussed ER return precautions and the patient agrees to return if any of her symptoms change or worsen.  FINAL CLINICAL IMPRESSION(S) / ED DIAGNOSES   Final diagnoses:  Nonspecific chest pain  Dyspnea, unspecified type     Rx / DC Orders   ED Discharge Orders          Ordered    lisinopril (ZESTRIL) 40 MG tablet  Daily        12/24/21 1329    hydrochlorothiazide (HYDRODIURIL) 25 MG tablet  Daily        12/24/21 1329    metoprolol tartrate (LOPRESSOR) 25 MG tablet  Daily        12/24/21 1329             Note:  This document was prepared using Dragon voice recognition software and may include unintentional dictation errors.   Victorino Dike, FNP 12/24/21 6712    Duffy Bruce, MD 12/25/21 [phone]

## 2021-12-24 NOTE — Telephone Encounter (Signed)
Called pt and informed her that if symptoms are worse to go to the ER.

## 2021-12-24 NOTE — Progress Notes (Signed)
Reached out to radiologist, to see if CT chest would be better assessed.

## 2021-12-24 NOTE — Assessment & Plan Note (Signed)
Chest xray today pending results D dimer ordered pending

## 2021-12-25 ENCOUNTER — Ambulatory Visit: Payer: No Typology Code available for payment source | Admitting: Cardiology

## 2021-12-25 NOTE — Progress Notes (Deleted)
Cardiology Office Note:    Date:  12/25/2021   ID:  Alisha Hughes, DOB [birth year], MRN [id number]  PCP:  Inda Coke, PA  Cardiologist:  None  Electrophysiologist:  None   Referring MD: Inda Coke, PA   No chief complaint on file. ***  History of Present Illness:    Alisha Hughes is a 41 y.o. female with a hx of hypertension, hyperlipidemia who presents as an ED follow-up for chest pain.  She was seen in ED for chest pain on 12/24/2021.  CTPA negative for PE.  Past Medical History:  Diagnosis Date   Abnormal ECG 02/03/2018   Anxiety and depression    History of kidney stones    HSV (herpes simplex virus) infection 02/10/2018   Hyperlipidemia    Hypertension     Past Surgical History:  Procedure Laterality Date   ABDOMINAL HYSTERECTOMY     CESAREAN SECTION     CESAREAN SECTION N/A 12/19/2012   Procedure: Repeat cesarean section with delivery of baby girl at 78.    Bilateral tubal ligation with filshie clips.;  Surgeon: Donnamae Jude, MD;  Location: [facility];  Service: Obstetrics;  Laterality: N/A;   CHOLECYSTECTOMY  2010   COLONOSCOPY     2009   COLPOSCOPY VULVA W/ BIOPSY     abnormal pap   CYSTOSCOPY N/A 01/22/2021   Procedure: CYSTOSCOPY;  Surgeon: Aletha Halim, MD;  Location: [facility];  Service: Gynecology;  Laterality: N/A;   ENDOMETRIAL ABLATION N/A 07/17/2015   Procedure: ENDOMETRIAL ABLATION WITH HTA, DILATATION AND CURRATAGE;  Surgeon: Emily Filbert, MD;  Location: [facility];  Service: Gynecology;  Laterality: N/A;   KNEE ARTHROSCOPY     LAPAROSCOPIC LYSIS OF ADHESIONS  07/17/2015   Procedure: LAPAROSCOPIC LYSIS OF ADHESIONS;  Surgeon: Emily Filbert, MD;  Location: [facility];  Service: Gynecology;;   LAPAROSCOPY N/A 07/17/2015   Procedure: LAPAROSCOPY DIAGNOSTIC;  Surgeon: Emily Filbert, MD;  Location: [facility];  Service: Gynecology;  Laterality: N/A;   TOTAL LAPAROSCOPIC HYSTERECTOMY WITH BILATERAL SALPINGO OOPHORECTOMY Bilateral 01/22/2021   Procedure: TOTAL  LAPAROSCOPIC HYSTERECTOMY WITH BILATERAL SALPINGO OOPHORECTOMY, OPEN VAGINAL  REPAIR OF VAGINAL CUFF;  Surgeon: Aletha Halim, MD;  Location: [facility];  Service: Gynecology;  Laterality: Bilateral;   TUBAL LIGATION     UPPER GI ENDOSCOPY     WISDOM TOOTH EXTRACTION      Current Medications: No outpatient medications have been marked as taking for the 12/25/21 encounter (Appointment) with Donato Heinz, MD.     Allergies:   Other, Latex, Metoclopramide, and Sertraline   Social History   Socioeconomic History   Marital status: Married    Spouse name: Not on file   Number of children: Not on file   Years of education: Not on file   Highest education level: Not on file  Occupational History   Not on file  Tobacco Use   Smoking status: Never   Smokeless tobacco: Never  Vaping Use   Vaping Use: Never used  Substance and Sexual Activity   Alcohol use: Yes    Alcohol/week: 0.0 standard drinks of alcohol    Comment: rarely   Drug use: No   Sexual activity: Yes    Partners: Male    Birth control/protection: Surgical  Other Topics Concern   Not on file  Social History Narrative   ** Merged History Encounter **       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, 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 Two girls  Married -- 8 years Fun: exercise, running, spending time with family, going to 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   Social Determinants of Health   Financial Resource Strain: Not on file  Food Insecurity: Not on file  Transportation Needs: Not on file  Physical Activity: Not on file  Stress: Not on file  Social Connections: Not on file     Family History: The patient's ***family history includes Anuerysm in her mother; Depression in her brother; Diabetes in her paternal grandmother; Heart disease in her father, maternal grandfather, and paternal grandmother; Hyperlipidemia in her father; Hypertension in her father and mother; Migraines in her mother; Multiple sclerosis in her maternal  grandmother; Parkinson's disease in her paternal grandfather; Prostate cancer in her father; Sudden death in her maternal grandfather and maternal grandmother.  ROS:   Please see the history of present illness.    *** All other systems reviewed and are negative.  EKGs/Labs/Other Studies Reviewed:    The following studies were reviewed today: ***  EKG:  EKG is *** ordered today.  The ekg ordered today demonstrates ***  Recent Labs: 12/23/2021: TSH 0.92 12/24/2021: ALT 13; BUN 12; Creatinine, Ser 0.78; Hemoglobin 15.7; Magnesium 1.9; Platelets 349; Potassium 3.7; Sodium 140  Recent Lipid Panel    Component Value Date/Time   CHOL 200 08/12/2017 1011   TRIG 69.0 08/12/2017 1011   HDL 66.10 08/12/2017 1011   CHOLHDL 3 08/12/2017 1011   VLDL 13.8 08/12/2017 1011   LDLCALC 120 (H) 08/12/2017 1011    Physical Exam:    VS:  LMP 12/14/2020 (Approximate)     Wt Readings from Last 3 Encounters:  12/24/21 152 lb (68.9 kg)  12/23/21 152 lb 6 oz (69.1 kg)  03/21/21 179 lb (81.2 kg)     GEN: *** Well nourished, well developed in no acute distress HEENT: Normal NECK: No JVD; No carotid bruits LYMPHATICS: No lymphadenopathy CARDIAC: ***RRR, no murmurs, rubs, gallops RESPIRATORY:  Clear to auscultation without rales, wheezing or rhonchi  ABDOMEN: Soft, non-tender, non-distended MUSCULOSKELETAL:  No edema; No deformity  SKIN: Warm and dry NEUROLOGIC:  Alert and oriented x 3 PSYCHIATRIC:  Normal affect   ASSESSMENT:    No diagnosis found. 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  Normal CMET, TSH.  Check echocardiogram.  Check renin/aldosterone, serum metanephrines, renal duplex.  Dexamethasone suppression test.  Secondary Causes of Hypertension    Medications/Herbal: OCP, steroids, stimulants, antidepressants, weight loss medication, immune suppressants, NSAIDs, sympathomimetics, alcohol, caffeine, licorice, ginseng, St. John's wort, chemo (none identified)   Sleep Apnea: (Uses BiPAP regularly) Renal artery stenosis: Negative for RAS 02/25/20 Hyperaldosteronism: renin/aldo pending Hyper/hypothyroidism: TSH normal 10/2019 Pheochromocytoma: (testing not indicated) Cushing's syndrome: cortisol normal, though was checked on steroids in the hospital. Coarctation of the aorta: BP symmetric   Medication Adjustments/Labs and Tests Ordered: Current medicines are reviewed at length with the patient today.  Concerns regarding medicines are outlined above.  No orders of the defined types were placed in this encounter.  No orders of the defined types were placed in this encounter.   There are no Patient Instructions on file for this visit.   Signed, Donato Heinz, MD  12/25/2021 6:54 AM    [facility]

## 2021-12-25 NOTE — Progress Notes (Signed)
Patient has since went to the emergency room on 12/24/2021.  CT of the chest was done in the emergency room which was unremarkable no interstitial prominence identified

## 2021-12-26 ENCOUNTER — Institutional Professional Consult (permissible substitution)
Payer: No Typology Code available for payment source | Admitting: Student in an Organized Health Care Education/Training Program

## 2021-12-30 ENCOUNTER — Encounter: Payer: Self-pay | Admitting: Family

## 2021-12-30 DIAGNOSIS — R071 Chest pain on breathing: Secondary | ICD-10-CM

## 2021-12-30 DIAGNOSIS — R0602 Shortness of breath: Secondary | ICD-10-CM

## 2021-12-30 DIAGNOSIS — I10 Essential (primary) hypertension: Secondary | ICD-10-CM

## 2022-01-01 ENCOUNTER — Telehealth: Payer: Self-pay | Admitting: Family

## 2022-01-01 NOTE — Telephone Encounter (Signed)
Alisha Hughes - Client TELEPHONE ADVICE RECORD AccessNurse Patient Name: Alisha Hughes Spencer Gender: Female DOB: 05/18/1980 Age: 41 Y 85 M 1 D Return Phone Number: [phone] (Primary) Address: City/ State/ Zip: [place]  [address] Client [facility] - Client Client Site [place] - [facility] Provider AA - PHYSICIAN, NOT LISTED- MD Contact Type Call Who Is Calling Patient / Member / Family / Caregiver Call Type Triage / Clinical Relationship To Patient Self Return Phone Number [phone] (Primary) Chief Complaint Medication reaction Reason for Call Symptomatic / Request for Health Information Initial Comment Caller was transferred from the [place], patient is experiencing bruises on her legs, leg pain. Caller is not sure if this is caused by the medication she is currently taking. Translation No Nurse Assessment Nurse: Velta Addison, RN, Crystal Date/Time (Eastern Time): 01/01/2022 3:22:16 PM Confirm and document reason for call. If symptomatic, describe symptoms. ---Caller was transferred from the [place], patient is experiencing bruises on her legs, leg pain. Caller is not sure if this is caused by the medication she is currently taking. Was seen last Monday for SOB, CP and increased Lisinopril. Went to ER due to increase in SOB, CP. Wednesday saw pulmonologist. Prednisone, Azithromycin, Metoprolol are her new meds. Leg was bruised down the back of her leg/calf and today it is throbbing and aching. States she has 4-5 bruises that have no injury to her leg. Leg does not appear swollen. Does the patient have any new or worsening symptoms? ---Yes Will a triage be completed? ---Yes Related visit to physician within the last 2 weeks? ---Yes Does the PT have any chronic conditions? (i.e. diabetes, asthma, this includes High risk factors for pregnancy, etc.) ---Yes List chronic  conditions. ---HTN, Lungs were only functioning at 60% for some reason, having Echo and Cardiology appt next week. Is the patient pregnant or possibly pregnant? (Ask all females between the ages of 12-55) ---No Is this a behavioral health or substance abuse call? ---No PLEASE NOTE: All timestamps contained within this report are represented as [place] Standard Time. CONFIDENTIALTY NOTICE: This fax transmission is intended only for the addressee. It contains information that is legally privileged, confidential or otherwise protected from use or disclosure. If you are not the intended recipient, you are strictly prohibited from reviewing, disclosing, copying using or disseminating any of this information or taking any action in reliance on or regarding this information. If you have received this fax in error, please notify us immediately by telephone so that we can arrange for its return to [place]. Phone: [phone], Toll-Free: [phone], Fax: [phone] Page: 2 of 2 Call Id: [id number] Guidelines Guideline Title Affirmed Question Affirmed Notes Nurse Date/Time Eilene Ghazi Time) Bruises Dizziness or lightheadedness Velta Addison, RN, Crystal 01/01/2022 3:26:54 PM Disp. Time Eilene Ghazi Time) Disposition Final User 01/01/2022 3:28:57 PM Go to ED Now Yes Velta Addison, RN, Crystal Final Disposition 01/01/2022 3:28:57 PM Go to ED Now Yes Velta Addison, RN, [place] Disagree/Comply Comply Caller Understands Yes PreDisposition Call Doctor Care Advice Given Per Guideline GO TO ED NOW: * You need to be seen in the Emergency Department. * Go to the ED at ___________ [place] now. Drive carefully. CARE ADVICE given per [place] (Adult) guideline. ANOTHER ADULT SHOULD DRIVE: * It is better and safer if another adult drives instead of you. Referrals [place]

## 2022-01-01 NOTE — Telephone Encounter (Signed)
Noted thank you

## 2022-01-01 NOTE — Telephone Encounter (Addendum)
Noted. Do recommend follow up with PCP.

## 2022-01-01 NOTE — Telephone Encounter (Signed)
[facility] - Client TELEPHONE ADVICE RECORD [facility] Patient Name: Alisha Hughes [place] Gender: Female DOB: 1980-12-10 Age: 41 Y 1 M 1 D Return Phone Number: [phone] (Primary) Address: City/ State/ Zip: [place]  [address] Client [facility] - Client Client Site [place] - [facility] Provider AA - PHYSICIAN, NOT LISTED- MD Contact Type Call Who Is Calling Patient / Member / Family / Caregiver Call Type Triage / Clinical Relationship To Patient Self Return Phone Number [phone] (Primary) Chief Complaint Medication reaction Reason for Call Symptomatic / Request for Health Information Initial Comment Caller was transferred from the [place], patient is experiencing bruises on her legs, leg pain. Caller is not sure if this is caused by the medication she is currently taking. Translation No Nurse Assessment Nurse: Velta Addison, RN, Crystal Date/Time (Eastern Time): 01/01/2022 3:22:16 PM Confirm and document reason for call. If symptomatic, describe symptoms. ---Caller was transferred from the [place], patient is experiencing bruises on her legs, leg pain. Caller is not sure if this is caused by the medication she is currently taking. Was seen last Monday for SOB, CP and increased Lisinopril. Went to ER due to increase in SOB, CP. Wednesday saw pulmonologist. Prednisone, Azithromycin, Metoprolol are her new meds. Leg was bruised down the back of her leg/calf and today it is throbbing and aching. States she has 4-5 bruises that have no injury to her leg. Leg does not appear swollen. Does the patient have any new or worsening symptoms? ---Yes Will a triage be completed? ---Yes Related visit to physician within the last 2 weeks? ---Yes Does the PT have any chronic conditions? (i.e. diabetes, asthma, this includes High risk factors for pregnancy, etc.) ---Yes List chronic  conditions. ---HTN, Lungs were only functioning at 60% for some reason, having Echo and Cardiology appt next week. Is the patient pregnant or possibly pregnant? (Ask all females between the ages of 42-55) ---No Is this a behavioral health or substance abuse call? ---No PLEASE NOTE: All timestamps contained within this report are represented as [place] Standard Time. CONFIDENTIALTY NOTICE: This fax transmission is intended only for the addressee. It contains information that is legally privileged, confidential or otherwise protected from use or disclosure. If you are not the intended recipient, you are strictly prohibited from reviewing, disclosing, copying using or disseminating any of this information or taking any action in reliance on or regarding this information. If you have received this fax in error, please notify us immediately by telephone so that we can arrange for its return to [place]. Phone: [phone], Toll-Free: [phone], Fax: [phone] Page: 2 of 2 Call Id: [id number] Guidelines Guideline Title Affirmed Question Affirmed Notes Nurse Date/Time Eilene Ghazi Time) Bruises Dizziness or lightheadedness Velta Addison, RN, Crystal 01/01/2022 3:26:54 PM Disp. Time Eilene Ghazi Time) Disposition Final User 01/01/2022 3:28:57 PM Go to ED Now Yes Velta Addison, RN, Crystal Final Disposition 01/01/2022 3:28:57 PM Go to ED Now Yes Velta Addison, RN, [place] Disagree/Comply Comply Caller Understands Yes PreDisposition Call Doctor Care Advice Given Per Guideline GO TO ED NOW: * You need to be seen in the Emergency Department. * Go to the ED at ___________ [place] now. Drive carefully. CARE ADVICE given per [place] (Adult) guideline. ANOTHER ADULT SHOULD DRIVE: * It is better and safer if another adult drives instead of you. Referrals [place]

## 2022-01-01 NOTE — Telephone Encounter (Signed)
Per access nurse note pt will comply with going to ED. Sending note to Red Christians FNP who is out of office this afternoon and Gentry Fitz NP who is in office this afternoon and [facility] CMA.

## 2022-01-01 NOTE — Telephone Encounter (Signed)
Patient called in stating that she is experiencing bruising in her legs,cramps and throbbing pains. She was seen In office by tabitha on 9/11 and placed on blood pressure medication. She's worried that her leg issues may be coming from that medication,she's not sure. I sent her to triage.

## 2022-01-02 NOTE — Telephone Encounter (Signed)
Called pt but vm cut out unable to leave a message. Did call this am and left a message to return call to office

## 2022-01-02 NOTE — Telephone Encounter (Signed)
Please call pt  Was instructed to go to er last night it doesn't look like she did.  If not will need office visit to assess concerns

## 2022-01-15 ENCOUNTER — Telehealth: Payer: No Typology Code available for payment source | Admitting: Nurse Practitioner

## 2022-01-15 ENCOUNTER — Encounter: Payer: Self-pay | Admitting: Family

## 2022-01-15 NOTE — Progress Notes (Signed)
I am sorry but we can not prescibe medications out of [place], [place]

## 2022-02-28 ENCOUNTER — Encounter: Payer: Self-pay | Admitting: Family Medicine

## 2022-02-28 ENCOUNTER — Ambulatory Visit (INDEPENDENT_AMBULATORY_CARE_PROVIDER_SITE_OTHER): Payer: 59 | Admitting: Family Medicine

## 2022-02-28 ENCOUNTER — Other Ambulatory Visit (HOSPITAL_COMMUNITY)
Admission: RE | Admit: 2022-02-28 | Discharge: 2022-02-28 | Disposition: A | Discharge status: Home / Self Care | Payer: 59 | Source: Ambulatory Visit | Attending: Family Medicine | Admitting: Family Medicine

## 2022-02-28 VITALS — BP 138/90 | HR 73 | Wt 157.0 lb

## 2022-02-28 DIAGNOSIS — N952 Postmenopausal atrophic vaginitis: Secondary | ICD-10-CM | POA: Insufficient documentation

## 2022-02-28 DIAGNOSIS — B379 Candidiasis, unspecified: Secondary | ICD-10-CM

## 2022-02-28 DIAGNOSIS — N760 Acute vaginitis: Secondary | ICD-10-CM

## 2022-02-28 MED ORDER — FLUCONAZOLE 150 MG PO TABS: 150.0000 mg | ORAL_TABLET | Freq: Once | ORAL | 3 refills | Status: AC

## 2022-02-28 MED ORDER — ESTRADIOL 0.1 MG/GM VA CREA: 1.0000 | TOPICAL_CREAM | Freq: Every day | VAGINAL | 2 refills | Status: DC

## 2022-02-28 NOTE — Progress Notes (Signed)
   GYNECOLOGY PROBLEM  VISIT ENCOUNTER NOTE  Subjective:   Alisha Hughes is a 41 y.o. G43P1101 female here for a problem GYN visit.  Current complaints: vaginal irritation. Tried OTC 1d yeast treatment and it improved but not fully. Reports low libido since TAH. She is on estrogen replacement currently. Reports vaginal dryness and is not using any lubricant currently.   Currently on estrogen therapy after TAH. She reports pain and irritation with sex  Denies abnormal vaginal bleeding, discharge, pelvic pain, problems with intercourse or other gynecologic concerns.    Gynecologic History Patient's last menstrual period was 12/14/2020 (approximate).  Contraception: status post hysterectomy  Health Maintenance Due  Topic Date Due   Hepatitis C Screening  Never done   COVID-19 Vaccine (4 - Pfizer series) 05/09/2020   INFLUENZA VACCINE  11/12/2021    The following portions of the patient's history were reviewed and updated as appropriate: allergies, current medications, past family history, past medical history, past social history, past surgical history and problem list.  Review of Systems Pertinent items are noted in HPI.   Objective:  BP (!) 138/90   Pulse 73   Wt 157 lb (71.2 kg)   LMP 12/14/2020 (Approximate)   BMI 27.81 kg/m  Gen: well appearing, NAD HEENT: no scleral icterus CV: RR Lung: Normal WOB Ext: warm well perfused  PELVIC: Normal appearing external genitalia; normal appearing vaginal mucosa- slightly atrophic cervix.  Some creamy white discharge noted.    Normal uterine size, no other palpable masses, no uterine or adnexal tenderness.   Assessment and Plan:  1. Acute on Chronic vaginitis - Cervicovaginal ancillary only( [place]) - estradiol (ESTRACE VAGINAL) 0.1 MG/GM vaginal cream; Place 1 Applicatorful vaginally daily.  Dispense: 42.5 g; Refill: 2  2. Postmenopausal atrophic vaginitis - Discussed role of lubricants for sexual activity-- discussed easily  available oil based lube - Reviewed possibly increasing patch if not improved. Will trial additional vaginal estrogen - Cervicovaginal ancillary only( [place]) - estradiol (ESTRACE VAGINAL) 0.1 MG/GM vaginal cream; Place 1 Applicatorful vaginally daily.  Dispense: 42.5 g; Refill: 2 - fluconazole (DIFLUCAN) 150 MG tablet; Take 1 tablet (150 mg total) by mouth once for 1 dose. Can take additional dose three days later if symptoms persist  Dispense: 1 tablet; Refill: 3  3. Yeast infection - fluconazole (DIFLUCAN) 150 MG tablet; Take 1 tablet (150 mg total) by mouth once for 1 dose. Can take additional dose three days later if symptoms persist  Dispense: 1 tablet; Refill: 3   Please refer to After Visit Summary for other counseling recommendations.   Return in about 3 months (around 05/31/2022) for vaginitis/libido.  Caren Macadam, MD, MPH, ABFM Attending [place] for [facility]

## 2022-02-28 NOTE — Progress Notes (Signed)
RGYN   C/O: Low libido ,Vaginal dryness, and Possible Yeast Infection.   Pt wants swab.

## 2022-03-03 LAB — CERVICOVAGINAL ANCILLARY ONLY
Bacterial Vaginitis (gardnerella): NEGATIVE
Candida Glabrata: NEGATIVE
Candida Vaginitis: NEGATIVE
Comment: NEGATIVE
Comment: NEGATIVE
Comment: NEGATIVE

## 2022-03-14 ENCOUNTER — Other Ambulatory Visit: Payer: Self-pay | Admitting: Obstetrics and Gynecology

## 2022-03-19 ENCOUNTER — Encounter: Payer: Self-pay | Admitting: Family Medicine

## 2022-03-19 ENCOUNTER — Other Ambulatory Visit: Payer: Self-pay | Admitting: *Deleted

## 2022-03-19 DIAGNOSIS — N952 Postmenopausal atrophic vaginitis: Secondary | ICD-10-CM

## 2022-03-19 DIAGNOSIS — N760 Acute vaginitis: Secondary | ICD-10-CM

## 2022-03-19 MED ORDER — ESTRADIOL 0.1 MG/GM VA CREA: 1.0000 | TOPICAL_CREAM | Freq: Every day | VAGINAL | 2 refills | Status: DC

## 2022-06-26 ENCOUNTER — Ambulatory Visit (INDEPENDENT_AMBULATORY_CARE_PROVIDER_SITE_OTHER): Payer: 59 | Admitting: Dermatology

## 2022-06-26 ENCOUNTER — Encounter: Payer: Self-pay | Admitting: Dermatology

## 2022-06-26 VITALS — BP 142/98 | HR 93

## 2022-06-26 DIAGNOSIS — L720 Epidermal cyst: Secondary | ICD-10-CM

## 2022-06-26 DIAGNOSIS — L708 Other acne: Secondary | ICD-10-CM | POA: Diagnosis not present

## 2022-06-26 MED ORDER — DOXYCYCLINE MONOHYDRATE 100 MG PO TABS: 100.0000 mg | ORAL_TABLET | Freq: Two times a day (BID) | ORAL | 0 refills | Status: AC

## 2022-06-26 NOTE — Progress Notes (Signed)
   Follow-Up Visit   Subjective  Alisha Hughes is a 42 y.o. female who presents for the following: Cyst (Left glabella area. Hx of cyst treated at or near this area in the past. Was swollen over past couple of days. Painful to touch).  The patient has spots, moles and lesions to be evaluated, some may be new or changing and the patient has concerns that these could be cancer.  Husband with patient.   The following portions of the chart were reviewed this encounter and updated as appropriate:  Tobacco  Allergies  Meds  Problems  Med Hx  Surg Hx  Fam Hx      Review of Systems: No other skin or systemic complaints except as noted in HPI or Assessment and Plan.   Objective  Well appearing patient in no apparent distress; mood and affect are within normal limits.  A focused examination was performed including face. Relevant physical exam findings are noted in the Assessment and Plan.  Glabella, left glabella Erythematous papule with surrounding slight erythema, no fluctuance   Assessment & Plan  Epidermal cyst (2) Glabella; left glabella  Acne cyst favored  Take doxycycline 100 mg tablet by mouth twice a day for 7 days. #14, 0 refills  Doxycycline should be taken with food to prevent nausea. Do not lay down for 30 minutes after taking. Be cautious with sun exposure and use good sun protection while on this medication. Pregnant women should not take this medication.      Intralesional injection - left glabella Location: left glabella  Informed Consent: Discussed risks (infection, pain, bleeding, bruising, thinning of the skin, loss of skin pigment, lack of resolution, and recurrence of lesion) and benefits of the procedure, as well as the alternatives. Informed consent was obtained. Preparation: The area was prepared a standard fashion.  Anesthesia: None  Procedure Details: An intralesional injection was performed with Kenalog 1 mg/cc. 0.05 cc in total were  injected.  Total number of injections: 1  Plan: The patient was instructed on post-op care. Recommend OTC analgesia as needed for pain.  NDC: [id number] Lot: [id number] Exp: 01/2024  doxycycline (ADOXA) 100 MG tablet - left glabella Take 1 tablet (100 mg total) by mouth 2 (two) times daily for 7 days. Take with food   Return for TBSE, Next Available.  I, Emelia Salisbury, CMA, am acting as scribe for Forest Gleason, MD.  Documentation: I have reviewed the above documentation for accuracy and completeness, and I agree with the above.  Forest Gleason, MD

## 2022-06-26 NOTE — Patient Instructions (Addendum)
Take doxycycline 100 mg tablet by mouth twice a day for 7 days. #14, 0 refills  Doxycycline should be taken with food to prevent nausea. Do not lay down for 30 minutes after taking. Be cautious with sun exposure and use good sun protection while on this medication. Pregnant women should not take this medication.     Intralesional steroid injection side effects were reviewed including thinning of the skin and discoloration, such as redness, lightening or darkening.    Due to recent changes in healthcare laws, you may see results of your pathology and/or laboratory studies on MyChart before the doctors have had a chance to review them. We understand that in some cases there may be results that are confusing or concerning to you. Please understand that not all results are received at the same time and often the doctors may need to interpret multiple results in order to provide you with the best plan of care or course of treatment. Therefore, we ask that you please give [place] 2 business days to thoroughly review all your results before contacting the office for clarification. Should we see a critical lab result, you will be contacted sooner.   If You Need Anything After Your Visit  If you have any questions or concerns for your doctor, please call our main line at [phone] and press option 4 to reach your doctor's medical assistant. If no one answers, please leave a voicemail as directed and we will return your call as soon as possible. Messages left after 4 pm will be answered the following business day.   You may also send [place] a message via [place]. We typically respond to MyChart messages within 1-2 business days.  For prescription refills, please ask your pharmacy to contact our office. Our fax number is [phone].  If you have an urgent issue when the clinic is closed that cannot wait until the next business day, you can page your doctor at the number below.    Please note that while we do  our best to be available for urgent issues outside of office hours, we are not available 24/7.   If you have an urgent issue and are unable to reach [place], you may choose to seek medical care at your doctor's office, retail clinic, urgent care center, or emergency room.  If you have a medical emergency, please immediately call 911 or go to the emergency department.  Pager Numbers  - Dr. Nehemiah Massed: [phone]  - Dr. Laurence Ferrari: [phone]  - Dr. Nicole Kindred: [phone]  In the event of inclement weather, please call our main line at [phone] for an update on the status of any delays or closures.  Dermatology Medication Tips: Please keep the boxes that topical medications come in in order to help keep track of the instructions about where and how to use these. Pharmacies typically print the medication instructions only on the boxes and not directly on the medication tubes.   If your medication is too expensive, please contact our office at [phone] option 4 or send [place] a message through [place].   We are unable to tell what your co-pay for medications will be in advance as this is different depending on your insurance coverage. However, we may be able to find a substitute medication at lower cost or fill out paperwork to get insurance to cover a needed medication.   If a prior authorization is required to get your medication covered by your insurance company, please allow [place] 1-2 business days to  complete this process.  Drug prices often vary depending on where the prescription is filled and some pharmacies may offer cheaper prices.  The website www.goodrx.com contains coupons for medications through different pharmacies. The prices here do not account for what the cost may be with help from insurance (it may be cheaper with your insurance), but the website can give you the price if you did not use any insurance.  - You can print the associated coupon and take it with your prescription to the  pharmacy.  - You may also stop by our office during regular business hours and pick up a GoodRx coupon card.  - If you need your prescription sent electronically to a different pharmacy, notify our office through [facility] or by phone at [phone] option 4.     Si Usted Necesita Algo Despus de Su Visita  Tambin puede enviarnos un mensaje a travs de Pharmacist, community. Por lo general respondemos a los mensajes de MyChart en el transcurso de 1 a 2 das hbiles.  Para renovar recetas, por favor pida a su farmacia que se ponga en contacto con nuestra oficina. Harland Dingwall de fax es [place] [phone].  Si tiene un asunto urgente cuando la clnica est cerrada y que no puede esperar hasta el siguiente da hbil, puede llamar/localizar a su doctor(a) al nmero que aparece a continuacin.   Por favor, tenga en cuenta que aunque hacemos todo lo posible para estar disponibles para asuntos urgentes fuera del horario de [place], no estamos disponibles las 24 horas del da, los 7 das de la [place].   Si tiene un problema urgente y no puede comunicarse con nosotros, puede optar por buscar atencin mdica  en el consultorio de su doctor(a), en una clnica privada, en un centro de atencin urgente o en una sala de emergencias.  Si tiene Engineering geologist, por favor llame inmediatamente al 911 o vaya a la sala de emergencias.  Nmeros de bper  - Dr. Nehemiah Massed: [phone]  - Dra. Moye: [phone]  - Dra. Nicole Kindred: [phone]  En caso de inclemencias del [place], por favor llame a Johnsie Kindred principal al [phone] para una actualizacin sobre el [place] de cualquier retraso o cierre.  Consejos para la medicacin en dermatologa: Por favor, guarde las cajas en las que vienen los medicamentos de uso tpico para ayudarle a seguir las instrucciones sobre dnde y cmo usarlos. Las farmacias generalmente imprimen las instrucciones del medicamento slo en las cajas y no directamente en los  tubos del [place].   Si su medicamento es muy caro, por favor, pngase en contacto con Zigmund Daniel llamando al [phone] y presione la opcin 4 o envenos un mensaje a travs de Pharmacist, community.   No podemos decirle cul ser su copago por los medicamentos por adelantado ya que esto es diferente dependiendo de la cobertura de su seguro. Sin embargo, es posible que podamos encontrar un medicamento sustituto a Electrical engineer un formulario para que el seguro cubra el medicamento que se considera necesario.   Si se requiere una autorizacin previa para que su compaa de seguros [place] su medicamento, por favor permtanos de 1 a 2 das hbiles para completar este proceso.  Los precios de los medicamentos varan con frecuencia dependiendo del Environmental consultant de dnde se surte la receta y alguna farmacias pueden ofrecer precios ms baratos.  El sitio web www.goodrx.com tiene cupones para medicamentos de Airline pilot. Los precios aqu no tienen en cuenta lo que podra costar con la ayuda del  seguro (puede ser ms barato con su seguro), pero el sitio web puede darle el precio si no Field seismologist.  - Puede imprimir el cupn correspondiente y llevarlo con su receta a la farmacia.  - Tambin puede pasar por nuestra oficina durante el horario de atencin regular y Charity fundraiser una tarjeta de cupones de GoodRx.  - Si necesita que su receta se enve electrnicamente a una farmacia diferente, informe a nuestra oficina a travs de MyChart de [place] o por telfono llamando al [phone] y presione la opcin 4.

## 2022-06-30 ENCOUNTER — Encounter: Payer: Self-pay | Admitting: Dermatology

## 2022-07-31 ENCOUNTER — Encounter: Payer: 59 | Admitting: Family

## 2022-08-13 ENCOUNTER — Other Ambulatory Visit: Payer: Self-pay | Admitting: *Deleted

## 2022-08-13 MED ORDER — FLUCONAZOLE 150 MG PO TABS: 150.0000 mg | ORAL_TABLET | Freq: Once | ORAL | 3 refills | Status: AC

## 2022-08-19 ENCOUNTER — Ambulatory Visit: Payer: 59 | Admitting: Obstetrics & Gynecology

## 2022-09-01 ENCOUNTER — Ambulatory Visit (INDEPENDENT_AMBULATORY_CARE_PROVIDER_SITE_OTHER): Payer: 59 | Admitting: Family

## 2022-09-01 ENCOUNTER — Encounter: Payer: Self-pay | Admitting: Family

## 2022-09-01 ENCOUNTER — Encounter: Payer: 59 | Admitting: Family

## 2022-09-01 VITALS — BP 136/88 | HR 68 | Temp 98.6°F | Ht 63.0 in | Wt 157.0 lb

## 2022-09-01 DIAGNOSIS — F419 Anxiety disorder, unspecified: Secondary | ICD-10-CM | POA: Diagnosis not present

## 2022-09-01 DIAGNOSIS — D582 Other hemoglobinopathies: Secondary | ICD-10-CM

## 2022-09-01 DIAGNOSIS — Z90710 Acquired absence of both cervix and uterus: Secondary | ICD-10-CM | POA: Diagnosis not present

## 2022-09-01 DIAGNOSIS — F32A Depression, unspecified: Secondary | ICD-10-CM

## 2022-09-01 DIAGNOSIS — E785 Hyperlipidemia, unspecified: Secondary | ICD-10-CM | POA: Diagnosis not present

## 2022-09-01 DIAGNOSIS — I10 Essential (primary) hypertension: Secondary | ICD-10-CM

## 2022-09-01 DIAGNOSIS — Z1231 Encounter for screening mammogram for malignant neoplasm of breast: Secondary | ICD-10-CM

## 2022-09-01 DIAGNOSIS — Z79890 Hormone replacement therapy (postmenopausal): Secondary | ICD-10-CM | POA: Diagnosis not present

## 2022-09-01 LAB — LIPID PANEL
Cholesterol: 223 mg/dL — ABNORMAL HIGH (ref 0–200)
HDL: 72.4 mg/dL (ref >39.00)
LDL Cholesterol: 136 mg/dL — ABNORMAL HIGH (ref 0–99)
NonHDL: 150.1
Total CHOL/HDL Ratio: 3
Triglycerides: 70 mg/dL (ref 0.0–149.0)
VLDL: 14 mg/dL (ref 0.0–40.0)

## 2022-09-01 LAB — CBC WITH DIFFERENTIAL/PLATELET
Basophils Absolute: 0 10*3/uL (ref 0.0–0.1)
Basophils Relative: 0.4 % (ref 0.0–3.0)
Eosinophils Absolute: 0.1 10*3/uL (ref 0.0–0.7)
Eosinophils Relative: 1.1 % (ref 0.0–5.0)
HCT: 42.8 % (ref 36.0–46.0)
Hemoglobin: 14.4 g/dL (ref 12.0–15.0)
Lymphocytes Relative: 41 % (ref 12.0–46.0)
Lymphs Abs: 2.1 10*3/uL (ref 0.7–4.0)
MCHC: 33.7 g/dL (ref 30.0–36.0)
MCV: 83.7 fl (ref 78.0–100.0)
Monocytes Absolute: 0.4 10*3/uL (ref 0.1–1.0)
Monocytes Relative: 7.9 % (ref 3.0–12.0)
Neutro Abs: 2.6 10*3/uL (ref 1.4–7.7)
Neutrophils Relative %: 49.6 % (ref 43.0–77.0)
Platelets: 291 10*3/uL (ref 150.0–400.0)
RBC: 5.12 Mil/uL — ABNORMAL HIGH (ref 3.87–5.11)
RDW: 13.7 % (ref 11.5–15.5)
WBC: 5.2 10*3/uL (ref 4.0–10.5)

## 2022-09-01 LAB — MICROALBUMIN / CREATININE URINE RATIO
Creatinine,U: 23.5 mg/dL
Microalb Creat Ratio: 3 mg/g (ref 0.0–30.0)
Microalb, Ur: <0.7 mg/dL (ref 0.0–1.9)

## 2022-09-01 MED ORDER — DESVENLAFAXINE SUCCINATE ER 50 MG PO TB24: 50.0000 mg | ORAL_TABLET | Freq: Every day | ORAL | 0 refills | Status: DC

## 2022-09-01 NOTE — Assessment & Plan Note (Signed)
Pt advised of the following:  Continue medication as prescribed. Monitor blood pressure periodically and/or when you feel symptomatic. Goal is <130/90 on average. Ensure that you have rested for 30 minutes prior to checking your blood pressure. Record your readings and bring them to your next visit if necessary.work on a low sodium diet.  

## 2022-09-01 NOTE — Progress Notes (Signed)
Subjective:  Patient ID: Alisha Hughes, female    DOB: 1981/03/27  Age: 42 y.o. MRN: [id number]  Patient Care Team: Mort Sawyers, FNP as PCP - General (Family Medicine) Woodworth Bing, MD as Consulting Physician (Obstetrics and Gynecology)   CC:  Chief Complaint  Patient presents with   Annual Exam    Fasting     HPI Alisha Hughes is a 42 y.o. female who presents today for an annual physical exam. She reports consuming a general diet.  Husband is a Systems analyst at [employer] camp, does this about three times a week and OT fitness.   She generally feels well. She reports sleeping poorly. She does not have additional problems to discuss today.   Increased stress, not sleeping well, because her 42 y/o has been recently diagnosed with epilepsy.   Vision:Within last year Dental:Receives regular dental care STD:The patient denies history of sexually transmitted disease.  Lung Cancer Screening with low-dose Chest CT: n/a - Adults age 56-80 who are current cigarette smokers or quit within the last 15 years. Must have 20 pack year history.  AAA Screening: n/a - Men age 77-75 who have ever smoked  Mammogram:  Last pap:  Colonoscopy: Bone density scan:  Pt is with acute concerns.   HTN: doing well on lisinopril 40 mg once daily, at times knows she 'spikes' as she gets a headache. She states that she doubles up her lisinopril when this occurs which is rare, she states she was told by cardiology she could do this even with max lisinopril 40 mg. Also on amlodipine 10 mg once daily. No pedal edema.  Does see cardiology regularly, usually annually.   Anxiety: increased especially with her daughters health. This is starting to affect her on a daily basis.      09/01/2022   11:11 AM 11/25/2017    9:41 AM 08/12/2017    9:42 AM  GAD 7 : Generalized Anxiety Score  Nervous, Anxious, on Edge 0 0 0  Control/stop worrying 1 2 0  Worry too much - different things 2 2 0  Trouble  relaxing 3 2 0  Restless 2 0 0  Easily annoyed or irritable 1 2 0  Afraid - awful might happen 1 0 0  Total GAD 7 Score 10 8 0  Anxiety Difficulty Somewhat difficult  Not difficult at all       09/01/2022   11:09 AM 03/18/2021    9:24 AM 11/25/2017    9:31 AM  PHQ9 SCORE ONLY  PHQ-9 Total Score 5 0 12       Advanced Directives Patient does not have advanced directives.   DEPRESSION SCREENING    09/01/2022   11:09 AM 03/18/2021    9:24 AM 11/25/2017    9:31 AM 08/12/2017    9:41 AM 10/09/2016    1:16 PM 07/15/2016    9:34 AM 07/09/2016   10:56 AM  PHQ 2/9 Scores  PHQ - 2 Score 1 0 3 0 2 2 3   PHQ- 9 Score 5  12 0 5 14 8      ROS: Negative unless specifically indicated above in HPI.    Current Outpatient Medications:    amLODipine (NORVASC) 10 MG tablet, Take 1 tablet (10 mg total) by mouth daily., Disp: 90 tablet, Rfl: 3   desvenlafaxine (PRISTIQ) 50 MG 24 hr tablet, Take 1 tablet (50 mg total) by mouth daily., Disp: 90 tablet, Rfl: 0   estradiol (VIVELLE-DOT) 0.05 MG/24HR patch, PLACE  1 PATCH (0.05 MG TOTAL) ONTO THE SKIN 2 (TWO) TIMES A WEEK., Disp: 8 patch, Rfl: 12   lisinopril (ZESTRIL) 40 MG tablet, Take 1 tablet (40 mg total) by mouth daily., Disp: 30 tablet, Rfl: 0   ondansetron (ZOFRAN-ODT) 4 MG disintegrating tablet, TAKE 1 TABLET BY MOUTH EVERY 8 HOURS AS NEEDED FOR NAUSEA AND VOMITING, Disp: , Rfl:    valACYclovir (VALTREX) 500 MG tablet, Take by mouth., Disp: , Rfl:     Objective:    BP 136/88   Pulse 68   Temp 98.6 F (37 C) (Temporal)   Ht 5\' 3"  (1.6 m)   Wt 157 lb (71.2 kg)   LMP 12/14/2020 (Approximate)   BMI 27.81 kg/m      Physical Exam Constitutional:      General: She is not in acute distress.    Appearance: Normal appearance. She is normal weight. She is not ill-appearing.  HENT:     Head: Normocephalic.     Right Ear: Tympanic membrane normal.     Left Ear: Tympanic membrane normal.     Nose: Nose normal.     Mouth/Throat:     Mouth:  Mucous membranes are moist.  Eyes:     Extraocular Movements: Extraocular movements intact.     Pupils: Pupils are equal, round, and reactive to light.  Cardiovascular:     Rate and Rhythm: Normal rate and regular rhythm.  Pulmonary:     Effort: Pulmonary effort is normal.     Breath sounds: Normal breath sounds.  Abdominal:     General: Abdomen is flat. Bowel sounds are normal.     Palpations: Abdomen is soft.     Tenderness: There is no guarding or rebound.  Musculoskeletal:        General: Normal range of motion.     Cervical back: Normal range of motion.  Skin:    General: Skin is warm.     Capillary Refill: Capillary refill takes less than 2 seconds.  Neurological:     General: No focal deficit present.     Mental Status: She is alert.  Psychiatric:        Mood and Affect: Mood normal.        Behavior: Behavior normal.        Thought Content: Thought content normal.        Judgment: Judgment normal.          Assessment & Plan:  H/O total hysterectomy  Screening mammogram for breast cancer -     3D Screening Mammogram, Left and Right; Future  Anxiety and depression Assessment & Plan: Start pristiq 50 mg once daily  Referral placed for therapy as well.   Orders: -     Desvenlafaxine Succinate ER; Take 1 tablet (50 mg total) by mouth daily.  Dispense: 90 tablet; Refill: 0 -     Ambulatory referral to Psychology  Post-menopause on HRT (hormone replacement therapy)  Essential hypertension Assessment & Plan: Pt advised of the following:  Continue medication as prescribed. Monitor blood pressure periodically and/or when you feel symptomatic. Goal is <130/90 on average. Ensure that you have rested for 30 minutes prior to checking your blood pressure. Record your readings and bring them to your next visit if necessary.work on a low sodium diet.   Orders: -     Microalbumin / creatinine urine ratio  Hyperlipidemia, unspecified hyperlipidemia type Assessment &  Plan: Work on low cholesterol diet and exercise as tolerated   Orders: -  Lipid panel  Elevated hemoglobin (HCC) -     Vitamin B12 -     CBC with Differential/Platelet      Follow-up: Return in about 6 weeks (around 10/13/2022) for f/u anxiety, f/u depression.   Mort Sawyers, FNP

## 2022-09-01 NOTE — Patient Instructions (Addendum)
I have sent an electronic order over to your preferred location for the following:   []   2D Mammogram  [x]   3D Mammogram  []   Bone Density   Please give this center a call to get scheduled at your convenience.  [x]   [facility]  [address] [place] [address]  [phone]  Make sure to wear two piece  clothing  No lotions powders or deodorants the day of the appointment Make sure to bring picture ID and insurance card.  Bring list of medications you are currently taking including any supplements.    ------------------------------------  Start one tablet pristiq once daily  Referral placed for psychologist.    ------------------------------------   Marina Gravel FNP-C

## 2022-09-01 NOTE — Assessment & Plan Note (Signed)
Work on low cholesterol diet and exercise as tolerated 

## 2022-09-01 NOTE — Assessment & Plan Note (Signed)
Start pristiq 50 mg once daily  Referral placed for therapy as well.

## 2022-09-02 ENCOUNTER — Ambulatory Visit (HOSPITAL_COMMUNITY)
Admission: RE | Admit: 2022-09-02 | Discharge: 2022-09-02 | Disposition: A | Discharge status: Home / Self Care | Payer: 59 | Source: Ambulatory Visit | Attending: Cardiology | Admitting: Cardiology

## 2022-09-02 ENCOUNTER — Encounter: Payer: Self-pay | Admitting: Family

## 2022-09-02 ENCOUNTER — Other Ambulatory Visit (HOSPITAL_COMMUNITY): Payer: Self-pay | Admitting: Medical

## 2022-09-02 DIAGNOSIS — M79604 Pain in right leg: Secondary | ICD-10-CM | POA: Diagnosis present

## 2022-09-02 LAB — VITAMIN B12: Vitamin B-12: 1244 pg/mL — ABNORMAL HIGH (ref 211–911)

## 2022-09-09 ENCOUNTER — Encounter: Payer: Self-pay | Admitting: Family

## 2022-09-09 NOTE — Telephone Encounter (Signed)
Hi, medication needs a PA submitted.

## 2022-09-10 ENCOUNTER — Other Ambulatory Visit (HOSPITAL_COMMUNITY): Payer: Self-pay

## 2022-09-10 ENCOUNTER — Telehealth: Payer: Self-pay

## 2022-09-10 NOTE — Telephone Encounter (Signed)
PA has been submitted and approved. Documenting in separate encounter.

## 2022-09-10 NOTE — Telephone Encounter (Signed)
Noted  

## 2022-09-10 NOTE — Telephone Encounter (Signed)
Pharmacy Patient Advocate Encounter  Prior Authorization for Desvenlafaxine has been submitted to and approved by [employer] (ins).    PA # [id number] Effective dates: 08/11/2022 through 09/10/2023

## 2022-09-15 ENCOUNTER — Other Ambulatory Visit: Payer: Self-pay | Admitting: *Deleted

## 2022-09-15 MED ORDER — ESTRADIOL 0.05 MG/24HR TD PTTW: 1.0000 | MEDICATED_PATCH | TRANSDERMAL | 1 refills | Status: DC

## 2022-10-01 ENCOUNTER — Encounter: Payer: 59 | Admitting: Dermatology

## 2022-10-03 ENCOUNTER — Ambulatory Visit
Admission: RE | Admit: 2022-10-03 | Discharge: 2022-10-03 | Disposition: A | Discharge status: Home / Self Care | Payer: 59 | Source: Ambulatory Visit | Attending: Family | Admitting: Family

## 2022-10-03 DIAGNOSIS — Z1231 Encounter for screening mammogram for malignant neoplasm of breast: Secondary | ICD-10-CM

## 2022-10-10 ENCOUNTER — Ambulatory Visit: Payer: 59 | Admitting: Clinical

## 2022-10-17 ENCOUNTER — Encounter: Payer: 59 | Admitting: Clinical

## 2022-10-17 NOTE — Progress Notes (Signed)
                Georgie Haque, LCSWThis encounter was created in error - please disregard. 

## 2023-01-05 ENCOUNTER — Other Ambulatory Visit: Payer: Self-pay | Admitting: Internal Medicine

## 2023-01-05 DIAGNOSIS — R0789 Other chest pain: Secondary | ICD-10-CM

## 2023-01-14 ENCOUNTER — Ambulatory Visit
Admission: RE | Admit: 2023-01-14 | Discharge: 2023-01-14 | Disposition: A | Discharge status: Home / Self Care | Payer: 59 | Source: Ambulatory Visit | Attending: Internal Medicine | Admitting: Internal Medicine

## 2023-01-14 DIAGNOSIS — R0789 Other chest pain: Secondary | ICD-10-CM | POA: Insufficient documentation

## 2023-03-30 ENCOUNTER — Ambulatory Visit: Payer: 59

## 2023-03-30 ENCOUNTER — Ambulatory Visit
Admission: RE | Admit: 2023-03-30 | Discharge: 2023-03-30 | Disposition: A | Discharge status: Home / Self Care | Payer: 59 | Source: Ambulatory Visit | Attending: Physician Assistant | Admitting: Physician Assistant

## 2023-03-30 VITALS — BP 144/87 | HR 95 | Temp 98.8°F | Resp 16 | Ht 63.0 in | Wt 160.0 lb

## 2023-03-30 DIAGNOSIS — R051 Acute cough: Secondary | ICD-10-CM

## 2023-03-30 DIAGNOSIS — R55 Syncope and collapse: Secondary | ICD-10-CM | POA: Diagnosis present

## 2023-03-30 DIAGNOSIS — R509 Fever, unspecified: Secondary | ICD-10-CM | POA: Diagnosis present

## 2023-03-30 DIAGNOSIS — U071 COVID-19: Secondary | ICD-10-CM | POA: Diagnosis present

## 2023-03-30 DIAGNOSIS — R519 Headache, unspecified: Secondary | ICD-10-CM | POA: Diagnosis present

## 2023-03-30 DIAGNOSIS — R110 Nausea: Secondary | ICD-10-CM

## 2023-03-30 LAB — RESP PANEL BY RT-PCR (RSV, FLU A&B, COVID)  RVPGX2
Influenza A by PCR: NEGATIVE
Influenza B by PCR: NEGATIVE
Resp Syncytial Virus by PCR: NEGATIVE
SARS Coronavirus 2 by RT PCR: POSITIVE — AB

## 2023-03-30 MED ORDER — KETOROLAC TROMETHAMINE 10 MG PO TABS: 10.0000 mg | ORAL_TABLET | Freq: Four times a day (QID) | ORAL | 0 refills | Status: AC | PRN

## 2023-03-30 MED ORDER — ONDANSETRON 8 MG PO TBDP
8.0000 mg | ORAL_TABLET | Freq: Once | ORAL | Status: AC
  Administered 2023-03-30: 8 mg via ORAL

## 2023-03-30 MED ORDER — PROMETHAZINE-DM 6.25-15 MG/5ML PO SYRP: 5.0000 mL | ORAL_SOLUTION | Freq: Four times a day (QID) | ORAL | 0 refills | Status: DC | PRN

## 2023-03-30 MED ORDER — KETOROLAC TROMETHAMINE 30 MG/ML IJ SOLN
30.0000 mg | Freq: Once | INTRAMUSCULAR | Status: AC
  Administered 2023-03-30: 30 mg via INTRAMUSCULAR

## 2023-03-30 MED ORDER — NIRMATRELVIR/RITONAVIR (PAXLOVID)TABLET: 3.0000 | ORAL_TABLET | Freq: Two times a day (BID) | ORAL | 0 refills | Status: AC

## 2023-03-30 NOTE — Discharge Instructions (Signed)
-  Positive for COVID. - I sent Paxlovid to pharmacy.  This medication is for 5 days.  I also sent cough medicine and Toradol as needed for headache. - Isolate until fever free for 24 hours and symptoms are improving.  As long as you are coughing consider wearing a mask. -Increase rest and fluids and be careful position changes especially if you are feeling dizzy. - If you begin to feel worse, have uncontrolled fever, weakness, worsening chest pain, increased shortness of breath or you continue to have episodes of syncope call 911 or have someone take you to the ER.

## 2023-03-30 NOTE — ED Provider Notes (Signed)
[facility]    CSN: [id number] Arrival date & time: 03/30/23  0856      History   Chief Complaint Chief Complaint  Patient presents with   Fever    Appt   Cough   Loss of Consciousness    HPI Alisha Hughes is a 42 y.o. female presenting for fever, fatigue, cough, congestion, nausea, diarrhea and headaches x 3 days. Reports she was trying to get medicine this morning and passed out.  Also reports chest heaviness/tightness and shortness of breath.  She says she felt dizzy and felt it coming on and was able to tell her husband " I am about to pass out."  She says he was able to help her to the couch where she "passed out for at least a minute."  She never had any falls or head injuries.  She says this has happened before when she has had her blood drawn.  No sick contact.  No other complaints.  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    Bilateral tubal ligation with filshie clips.;  Surgeon: Reva Bores, MD;  Location: [facility];  Service: Obstetrics;  Laterality: N/A;   CHOLECYSTECTOMY  2010   COLONOSCOPY     2009   COLPOSCOPY VULVA W/  BIOPSY     abnormal pap   CYSTOSCOPY N/A 01/22/2021   Procedure: CYSTOSCOPY;  Surgeon: [place] Bing, MD;  Location: [facility];  Service: Gynecology;  Laterality: N/A;   ENDOMETRIAL ABLATION N/A 07/17/2015   Procedure: ENDOMETRIAL ABLATION WITH HTA, DILATATION AND CURRATAGE;  Surgeon: Allie Bossier, MD;  Location: [facility];  Service: Gynecology;  Laterality: N/A;   KNEE ARTHROSCOPY     LAPAROSCOPIC LYSIS OF ADHESIONS  07/17/2015   Procedure: LAPAROSCOPIC LYSIS OF ADHESIONS;  Surgeon: Allie Bossier, MD;  Location: [facility];  Service: Gynecology;;   LAPAROSCOPY N/A 07/17/2015   Procedure: LAPAROSCOPY DIAGNOSTIC;  Surgeon: Allie Bossier, MD;  Location: [facility];  Service: Gynecology;  Laterality: N/A;   TOTAL LAPAROSCOPIC HYSTERECTOMY WITH BILATERAL SALPINGO OOPHORECTOMY Bilateral 01/22/2021   Procedure: TOTAL LAPAROSCOPIC HYSTERECTOMY WITH BILATERAL SALPINGO OOPHORECTOMY, OPEN VAGINAL  REPAIR OF VAGINAL CUFF;  Surgeon: [place] Bing, MD;  Location: [facility];  Service: Gynecology;  Laterality: Bilateral;   TUBAL LIGATION     UPPER GI ENDOSCOPY     WISDOM TOOTH EXTRACTION      OB History     Gravida  2   Para  2   Term  1   Preterm  1   AB  0   Living  1      SAB  0   IAB  0   Ectopic  0   Multiple      Live Births  2            Home Medications    Prior to Admission medications   Medication Sig Start Date End Date Taking? Authorizing Provider  amLODipine (NORVASC) 10 MG tablet Take 1 tablet (10 mg total) by mouth daily. 07/11/20  Yes Jarold Motto, PA  desvenlafaxine (PRISTIQ) 50 MG 24 hr tablet Take 1 tablet (50 mg total) by mouth daily. 09/01/22  Yes Dugal, Wyatt Mage, FNP  estradiol (VIVELLE-DOT) 0.05 MG/24HR patch Place 1 patch (0.05 mg total) onto the skin 2 (two) times a week. 09/15/22  Yes Federico Flake, MD  ketorolac (TORADOL) 10 MG tablet Take 1 tablet (10 mg total) by mouth every 6 (six) hours as needed for up to 3 days for moderate pain (pain score 4-6) or  severe pain (pain score 7-10). 03/30/23 04/02/23 Yes Eusebio Friendly B, PA-C  lisinopril (ZESTRIL) 40 MG tablet Take 1 tablet (40 mg total) by mouth daily. 12/24/21 03/30/23 Yes Triplett, Cari B, FNP  nirmatrelvir/ritonavir (PAXLOVID) 20 x 150 MG & 10 x 100MG  TABS Take 3 tablets by mouth 2 (two) times daily for 5 days. Patient GFR is 0.78. Take nirmatrelvir (150 mg) two tablets twice daily for 5 days and ritonavir (100 mg) one tablet twice daily for 5 days. 03/30/23 04/04/23 Yes Eusebio Friendly B, PA-C  ondansetron (ZOFRAN-ODT) 4 MG disintegrating tablet TAKE 1 TABLET BY MOUTH EVERY 8 HOURS AS NEEDED FOR NAUSEA AND VOMITING 12/28/20  Yes [provider]  promethazine-dextromethorphan (PROMETHAZINE-DM) 6.25-15 MG/5ML syrup Take 5 mLs by mouth 4 (four) times daily as needed. 03/30/23  Yes Shirlee Latch, PA-C  valACYclovir (VALTREX) 500 MG tablet Take by mouth. 02/10/18  Yes [provider]    Family History Family History  Problem Relation Age of Onset   Hypertension Mother    Anuerysm Mother        brain   Migraines Mother    Anxiety disorder Mother    Heart disease Father    Hypertension Father    Hyperlipidemia Father    Prostate cancer Father    Anxiety disorder Father    Diabetes Father    Vision loss Father    ADD / ADHD Daughter    Anxiety disorder Daughter    Epilepsy Daughter    Other Daughter        pots   Anxiety disorder Daughter    Depression Daughter    ADD / ADHD Daughter    Multiple sclerosis Maternal Grandmother    Sudden death Maternal Grandmother    Early death Maternal Grandmother    Heart disease Maternal Grandfather    Sudden death Maternal Grandfather    Early death Maternal Grandfather    Heart disease Paternal Grandmother    Diabetes Paternal Grandmother    Parkinson's disease Paternal Grandfather    Depression Brother    Anxiety disorder Brother    Breast cancer Neg Hx     Social History Social History   Tobacco Use   Smoking  status: Never   Smokeless tobacco: Never  Vaping Use   Vaping status: Never Used  Substance Use Topics   Alcohol use: Yes    Comment: Rarely drink 1x a month if that, 1-2 margaritas   Drug use: No     Allergies   Other, Latex, Metoclopramide, Sertraline, and Wellbutrin [bupropion]   Review of Systems  Review of Systems  Constitutional:  Positive for fatigue and fever. Negative for chills and diaphoresis.  HENT:  Positive for congestion and rhinorrhea. Negative for ear pain, sinus pressure, sinus pain and sore throat.   Eyes:  Negative for visual disturbance.  Respiratory:  Positive for cough and shortness of breath.   Cardiovascular:  Negative for chest pain.  Gastrointestinal:  Positive for diarrhea and nausea. Negative for abdominal pain and vomiting.  Musculoskeletal:  Negative for arthralgias and myalgias.  Skin:  Negative for rash.  Neurological:  Positive for dizziness, syncope and headaches. Negative for weakness.  Hematological:  Negative for adenopathy.     Physical Exam Triage Vital Signs ED Triage Vitals  Encounter Vitals Group     BP 03/30/23 0934 (!) 144/87     Systolic BP Percentile --      Diastolic BP Percentile --      Pulse Rate 03/30/23 0934 95     Resp 03/30/23 0934 16     Temp 03/30/23 0934 98.8 F (37.1 C)     Temp Source 03/30/23 0934 Oral     SpO2 03/30/23 0934 98 %     Weight 03/30/23 0933 160 lb (72.6 kg)     Height 03/30/23 0933 5\' 3"  (1.6 m)     Head Circumference --      Peak Flow --      Pain Score 03/30/23 0937 10     Pain Loc --      Pain Education --      Exclude from Growth Chart --    No data found.  Updated Vital Signs BP (!) 144/87 (BP Location: Right Arm)   Pulse 95   Temp 98.8 F (37.1 C) (Oral)   Resp 16   Ht 5\' 3"  (1.6 m)   Wt 160 lb (72.6 kg)   LMP 12/14/2020 (Approximate)   SpO2 98%   BMI 28.34 kg/m       Physical Exam Vitals and nursing note reviewed.  Constitutional:      General: She is not in acute  distress.    Appearance: Normal appearance. She is not ill-appearing or toxic-appearing.  HENT:     Head: Normocephalic and atraumatic.     Right Ear: Tympanic membrane, ear canal and external ear normal.     Left Ear: Tympanic membrane, ear canal and external ear normal.     Nose: Congestion present.     Mouth/Throat:     Mouth: Mucous membranes are moist.     Pharynx: Oropharynx is clear.  Eyes:     General: No scleral icterus.       Right eye: No discharge.        Left eye: No discharge.     Conjunctiva/sclera: Conjunctivae normal.  Cardiovascular:     Rate and Rhythm: Normal rate and regular rhythm.     Heart sounds: Normal heart sounds.  Pulmonary:     Effort: Pulmonary effort is normal. No respiratory distress.     Breath sounds: Normal breath sounds.  Musculoskeletal:     Cervical back: Neck supple.  Skin:    General: Skin is dry.  Neurological:     General: No focal deficit present.     Mental Status: She is alert. Mental status is at baseline.     Motor: No weakness.     Gait: Gait normal.  Psychiatric:        Mood and Affect: Mood normal.        Behavior:  Behavior normal.      UC Treatments / Results  Labs (all labs ordered are listed, but only abnormal results are displayed) Labs Reviewed  RESP PANEL BY RT-PCR (RSV, FLU A&B, COVID)  [id number] - Abnormal; Notable for the following components:      Result Value   SARS Coronavirus 2 by RT PCR POSITIVE (*)    All other components within normal limits    EKG   Radiology DG Chest 2 View Result Date: 03/30/2023 CLINICAL DATA:  Fever with cough congestion for 3 days. EXAM: CHEST - 2 VIEW COMPARISON:  Chest radiographs 12/23/2021 and 07/11/2020. Cardiac CT 01/14/2023. FINDINGS: The heart size and mediastinal contours are normal. The lungs are clear. There is no pleural effusion or pneumothorax. No acute osseous findings are identified. IMPRESSION: No evidence of acute cardiopulmonary process. Electronically Signed    By: Carey Bullocks M.D.   On: 03/30/2023 10:58    Procedures ED EKG  Date/Time: 03/30/2023 11:13 AM  Performed by: Shirlee Latch, PA-C Authorized by: Shirlee Latch, PA-C   Interpretation:    Interpretation: normal   Rate:    ECG rate:  72   ECG rate assessment: normal   Rhythm:    Rhythm: sinus rhythm   Ectopy:    Ectopy: none   QRS:    QRS axis:  Normal   QRS intervals:  Normal   QRS conduction: normal   ST segments:    ST segments:  Normal T waves:    T waves: normal   Comments:     Normal sinus rhythm with regular rate.  (including critical care time)  Medications Ordered in UC Medications  ketorolac (TORADOL) 30 MG/ML injection 30 mg (30 mg Intramuscular Given 03/30/23 1032)  ondansetron (ZOFRAN-ODT) disintegrating tablet 8 mg (8 mg Oral Given 03/30/23 1032)    Initial Impression / Assessment and Plan / UC Course  I have reviewed the triage vital signs and the nursing notes.  Pertinent labs & imaging results that were available during my care of the patient were reviewed by me and considered in my medical decision making (see chart for details).   42 y/o female presents for fever, fatigue, cough, congestion, nausea, diarrhea, and headaches x 3 days.  Also reports chest heaviness, shortness of breath.  Reports syncopal episode this morning.  She did have prodromal symptoms and was able to lay down on the couch before "passing out for a minute."  Vitals are stable. She is ill appearing but non toxic.  On exam has mild nasal congestion.  Throat clear.  Chest clear to auscultation heart regular rate and rhythm.  PERRLA and cranial nerves grossly intact.  EKG, CXR, respiratory panel obtained.  Patient given 30 mg IM ketorolac in clinic for headache and 8 mg ODT Zofran for nausea.  EKG shows normal sinus rhythm with regular rate.  Reviewed this with patient.  Positive COVID test.  Chest x-ray within normal limits.  Reviewed all results with patient.  Reviewed  current CDC guidelines, isolation protocol and ED precautions.  She desires treatment for COVID with Paxlovid.  Kidney function in the past few months is within normal limits.  Sent Paxlovid to pharmacy as well as Promethazine DM and ketorolac.  Encouraged increasing rest and fluids.  Reviewed return and ER precautions.  Acute illness with systemic symptoms.   Final Clinical Impressions(s) / UC Diagnoses   Final diagnoses:  COVID-19  Acute cough  Fever, unspecified fever cause  Nausea  Acute nonintractable  headache, unspecified headache type     Discharge Instructions      -Positive for COVID. - I sent Paxlovid to pharmacy.  This medication is for 5 days.  I also sent cough medicine and Toradol as needed for headache. - Isolate until fever free for 24 hours and symptoms are improving.  As long as you are coughing consider wearing a mask. -Increase rest and fluids and be careful position changes especially if you are feeling dizzy. - If you begin to feel worse, have uncontrolled fever, weakness, worsening chest pain, increased shortness of breath or you continue to have episodes of syncope call 911 or have someone take you to the ER.     ED Prescriptions     Medication Sig Dispense Auth. Provider   promethazine-dextromethorphan (PROMETHAZINE-DM) 6.25-15 MG/5ML syrup Take 5 mLs by mouth 4 (four) times daily as needed. 118 mL Shirlee Latch, PA-C   nirmatrelvir/ritonavir (PAXLOVID) 20 x 150 MG & 10 x 100MG  TABS Take 3 tablets by mouth 2 (two) times daily for 5 days. Patient GFR is 0.78. Take nirmatrelvir (150 mg) two tablets twice daily for 5 days and ritonavir (100 mg) one tablet twice daily for 5 days. 30 tablet Eusebio Friendly B, PA-C   ketorolac (TORADOL) 10 MG tablet Take 1 tablet (10 mg total) by mouth every 6 (six) hours as needed for up to 3 days for moderate pain (pain score 4-6) or severe pain (pain score 7-10). 12 tablet Gareth Morgan      PDMP not reviewed this  encounter.   Shirlee Latch, PA-C 03/30/23 1115

## 2023-03-30 NOTE — ED Triage Notes (Signed)
Pt c/o fever & cough x3 days. Tmax 102 Friday.  Also c/o nausea & diarrhea. States she passed out this AM while trying to get some medicine. Denies any head injury. Was out for 1 min.

## 2023-04-01 ENCOUNTER — Other Ambulatory Visit: Payer: Self-pay | Admitting: Family Medicine

## 2023-04-09 ENCOUNTER — Telehealth: Payer: 59 | Admitting: Physician Assistant

## 2023-04-09 DIAGNOSIS — K649 Unspecified hemorrhoids: Secondary | ICD-10-CM

## 2023-04-09 MED ORDER — HYDROCORTISONE ACE-PRAMOXINE 1-1 % EX CREA: 1.0000 | TOPICAL_CREAM | Freq: Two times a day (BID) | CUTANEOUS | 0 refills | Status: DC

## 2023-04-09 NOTE — Progress Notes (Signed)

## 2023-04-29 ENCOUNTER — Other Ambulatory Visit: Payer: Self-pay | Admitting: *Deleted

## 2023-04-29 NOTE — Telephone Encounter (Signed)
 erroneous

## 2023-05-11 ENCOUNTER — Ambulatory Visit (INDEPENDENT_AMBULATORY_CARE_PROVIDER_SITE_OTHER): Payer: 59 | Admitting: Dermatology

## 2023-05-11 DIAGNOSIS — L814 Other melanin hyperpigmentation: Secondary | ICD-10-CM | POA: Diagnosis not present

## 2023-05-11 DIAGNOSIS — Z1283 Encounter for screening for malignant neoplasm of skin: Secondary | ICD-10-CM

## 2023-05-11 DIAGNOSIS — L905 Scar conditions and fibrosis of skin: Secondary | ICD-10-CM

## 2023-05-11 DIAGNOSIS — D1801 Hemangioma of skin and subcutaneous tissue: Secondary | ICD-10-CM

## 2023-05-11 DIAGNOSIS — D224 Melanocytic nevi of scalp and neck: Secondary | ICD-10-CM

## 2023-05-11 DIAGNOSIS — D2239 Melanocytic nevi of other parts of face: Secondary | ICD-10-CM

## 2023-05-11 DIAGNOSIS — W908XXA Exposure to other nonionizing radiation, initial encounter: Secondary | ICD-10-CM

## 2023-05-11 DIAGNOSIS — Z808 Family history of malignant neoplasm of other organs or systems: Secondary | ICD-10-CM

## 2023-05-11 DIAGNOSIS — L853 Xerosis cutis: Secondary | ICD-10-CM

## 2023-05-11 DIAGNOSIS — L820 Inflamed seborrheic keratosis: Secondary | ICD-10-CM | POA: Diagnosis not present

## 2023-05-11 DIAGNOSIS — L578 Other skin changes due to chronic exposure to nonionizing radiation: Secondary | ICD-10-CM

## 2023-05-11 DIAGNOSIS — L821 Other seborrheic keratosis: Secondary | ICD-10-CM

## 2023-05-11 DIAGNOSIS — D229 Melanocytic nevi, unspecified: Secondary | ICD-10-CM

## 2023-05-11 NOTE — Patient Instructions (Addendum)
Mole A mole is a colored (pigmented) growth on the skin. Moles are very common. They are usually harmless, but some moles can become cancerous over time. What are the causes? Moles are caused when pigmented skin cells grow together in clusters instead of spreading out in the skin as they normally do. The reason why the skin cells grow together in clusters is not known. What increases the risk? You are more likely to develop a mole if: You have family members who have moles. You are fair skinned. You have red or blond hair. You are often outdoors and exposed to the sun. You received phototherapy when you were a newborn baby. You are female. What are the signs or symptoms? A mole may occur anywhere on your skin. A mole may be: [place] or another color. Although moles are most often brown, they can also be tan, black, red, pink, blue, skin-toned, or colorless. Flat or raised. Smooth or wrinkled. Round in shape. How is this diagnosed? A mole is diagnosed with a skin exam. If your health care provider thinks a mole may be cancerous, all or part of the mole will be removed for testing (biopsy). How is this treated? Most moles are noncancerous (benign) and do not require treatment. If a mole is found to be cancerous, it will be removed. You may also choose to have a mole removed if it is causing pain or if you do not like the way it looks. Follow these instructions at home: General instructions  Every month, look for new moles and check your existing moles for changes. This is important because a change in a mole can mean that the mole has become cancerous. ABCDE changes in a mole indicate that you should be evaluated by your health care provider. ABCDE stands for: Asymmetry. This means the mole has an irregular shape. It is not round or oval. Border. This means the mole has an irregular or bumpy border. Color. This means the mole has multiple colors in it, including brown, black, blue, red, or  tan. Note that it is normal for moles to get darker when a woman is pregnant or takes birth control pills. Diameter. This means the mole is more than 0.2 inches (6 mm) across. Evolving. This refers to any unusual changes or symptoms in the mole, such as pain, itching, stinging, sensitivity, or bleeding. If you have a large number of moles, see a skin doctor (dermatologist) at least one time every year for a full-body skin check. Lifestyle  When you are outdoors, wear sunscreen with SPF 30 (sun protection factor 30) or higher. Use an adequate amount of sunscreen to cover exposed areas of skin. Put it on 30 minutes before you go out. Reapply it every 2 hours or anytime you come out of the water. When you are out in the sun, wear a broad-brimmed hat and clothing that covers your arms and legs. Wear wraparound sunglasses. Contact a health care provider if: The size, shape, borders, or color of your mole changes. Your mole, or the skin near the mole, becomes painful, sore, red, or swollen. Your mole: Develops more than one color. Itches or bleeds. Becomes scaly, sheds skin, or oozes fluid. Becomes flat or develops raised areas. Becomes hard or soft. You develop a new mole. Summary A mole is a colored (pigmented) growth on the skin. Moles are very common. They are usually harmless, but some moles can become cancerous over time. Every month, look for new moles and check your  existing moles for changes. This is important because a change in a mole can mean that the mole has become cancerous. If you have a large number of moles, see a skin doctor (dermatologist) at least one time every year for a full-body skin check. When you are outdoors, wear sunscreen with SPF 30 (sun protection factor 30) or higher. Reapply it every 2 hours or anytime you come out of the water. Contact a health care provider if you notice changes in a mole or if you develop a new mole. This information is not intended to replace  advice given to you by your health care provider. Make sure you discuss any questions you have with your health care provider. Document Revised: 12/20/2020 Document Reviewed: 12/20/2020 Elsevier Patient Education  2024 Elsevier Inc.Gentle Skin Care Guide  1. Bathe no more than once a day.  2. Avoid bathing in hot water  3. Use a mild soap like Dove, Vanicream, Cetaphil, CeraVe. Can use Lever 2000 or Cetaphil antibacterial soap  4. Use soap only where you need it. On most days, use it under your arms, between your legs, and on your feet. Let the water rinse other areas unless visibly dirty.  5. When you get out of the bath/shower, use a towel to gently blot your skin dry, don't rub it.  6. While your skin is still a little damp, apply a moisturizing cream such as Vanicream, CeraVe, Cetaphil, Eucerin, Sarna lotion or plain Vaseline Jelly. For hands apply Neutrogena [place] Hand Cream or Excipial Hand Cream.  7. Reapply moisturizer any time you start to itch or feel dry.  8. Sometimes using free and clear laundry detergents can be helpful. Fabric softener sheets should be avoided. Downy Free & Gentle liquid, or any liquid fabric softener that is free of dyes and perfumes, it acceptable to use  9. If your doctor has given you prescription creams you may apply moisturizers over them     Due to recent changes in healthcare laws, you may see results of your pathology and/or laboratory studies on MyChart before the doctors have had a chance to review them. We understand that in some cases there may be results that are confusing or concerning to you. Please understand that not all results are received at the same time and often the doctors may need to interpret multiple results in order to provide you with the best plan of care or course of treatment. Therefore, we ask that you please give [place] 2 business days to thoroughly review all your results before contacting the office for clarification.  Should we see a critical lab result, you will be contacted sooner.   If You Need Anything After Your Visit  If you have any questions or concerns for your doctor, please call our main line at [phone] and press option 4 to reach your doctor's medical assistant. If no one answers, please leave a voicemail as directed and we will return your call as soon as possible. Messages left after 4 pm will be answered the following business day.   You may also send [place] a message via MyChart. We typically respond to MyChart messages within 1-2 business days.  For prescription refills, please ask your pharmacy to contact our office. Our fax number is [phone].  If you have an urgent issue when the clinic is closed that cannot wait until the next business day, you can page your doctor at the number below.    Please note that while we do  our best to be available for urgent issues outside of office hours, we are not available 24/7.   If you have an urgent issue and are unable to reach [place], you may choose to seek medical care at your doctor's office, retail clinic, urgent care center, or emergency room.  If you have a medical emergency, please immediately call 911 or go to the emergency department.  Pager Numbers  - Dr. Gwen Pounds: [phone]  - Dr. Roseanne Reno: [phone]  - Dr. Katrinka Blazing: [phone]   In the event of inclement weather, please call our main line at [phone] for an update on the status of any delays or closures.  Dermatology Medication Tips: Please keep the boxes that topical medications come in in order to help keep track of the instructions about where and how to use these. Pharmacies typically print the medication instructions only on the boxes and not directly on the medication tubes.   If your medication is too expensive, please contact our office at [phone] option 4 or send [place] a message through MyChart.   We are unable to tell what your co-pay for medications will be  in advance as this is different depending on your insurance coverage. However, we may be able to find a substitute medication at lower cost or fill out paperwork to get insurance to cover a needed medication.   If a prior authorization is required to get your medication covered by your insurance company, please allow [place] 1-2 business days to complete this process.  Drug prices often vary depending on where the prescription is filled and some pharmacies may offer cheaper prices.  The website www.goodrx.com contains coupons for medications through different pharmacies. The prices here do not account for what the cost may be with help from insurance (it may be cheaper with your insurance), but the website can give you the price if you did not use any insurance.  - You can print the associated coupon and take it with your prescription to the pharmacy.  - You may also stop by our office during regular business hours and pick up a GoodRx coupon card.  - If you need your prescription sent electronically to a different pharmacy, notify our office through [facility] or by phone at [phone] option 4.     Si Usted Necesita Algo Despus de Su Visita  Tambin puede enviarnos un mensaje a travs de Clinical cytogeneticist. Por lo general respondemos a los mensajes de MyChart en el transcurso de 1 a 2 das hbiles.  Para renovar recetas, por favor pida a su farmacia que se ponga en contacto con nuestra oficina. Annie Sable de fax es [place] [phone].  Si tiene un asunto urgente cuando la clnica est cerrada y que no puede esperar hasta el siguiente da hbil, puede llamar/localizar a su doctor(a) al nmero que aparece a continuacin.   Por favor, tenga en cuenta que aunque hacemos todo lo posible para estar disponibles para asuntos urgentes fuera del horario de [place], no estamos disponibles las 24 horas del da, los 7 [address]  [address] de la [place].   Si tiene un problema urgente y no puede comunicarse con nosotros,  puede optar por buscar atencin mdica  en el consultorio de su doctor(a), en una clnica privada, en un centro de atencin urgente o en una sala de emergencias.  Si tiene Engineer, drilling, por favor llame inmediatamente al 911 o vaya a la sala de emergencias.  Nmeros de bper  - Dr. Gwen Pounds: [phone]  - Dra. Roseanne Reno:  [phone]  - Dr. Katrinka Blazing: [phone]   En caso de inclemencias del tiempo, por favor llame a Lacy Duverney principal al [phone] para una actualizacin sobre el [place] de cualquier retraso o cierre.  Consejos para la medicacin en dermatologa: Por favor, guarde las cajas en las que vienen los medicamentos de uso tpico para ayudarle a seguir las instrucciones sobre dnde y cmo usarlos. Las farmacias generalmente imprimen las instrucciones del medicamento slo en las cajas y no directamente en los tubos del [place].   Si su medicamento es muy caro, por favor, pngase en contacto con Rolm Gala llamando al [phone] y presione la opcin 4 o envenos un mensaje a travs de Clinical cytogeneticist.   No podemos decirle cul ser su copago por los medicamentos por adelantado ya que esto es diferente dependiendo de la cobertura de su seguro. Sin embargo, es posible que podamos encontrar un medicamento sustituto a Audiological scientist un formulario para que el seguro cubra el medicamento que se considera necesario.   Si se requiere una autorizacin previa para que su compaa de seguros [place] su medicamento, por favor permtanos de 1 a 2 das hbiles para completar [address]  Los precios de los medicamentos varan con frecuencia dependiendo del Environmental consultant de dnde se surte la receta y alguna farmacias pueden ofrecer precios ms baratos.  El sitio web www.goodrx.com tiene cupones para medicamentos de Health and safety inspector. Los precios aqu no tienen en cuenta lo que podra costar con la ayuda del seguro (puede ser ms barato con su seguro), pero el sitio web puede darle  el precio si no utiliz Tourist information centre manager.  - Puede imprimir el cupn correspondiente y llevarlo con su receta a la farmacia.  - Tambin puede pasar por nuestra oficina durante el horario de atencin regular y Education officer, museum una tarjeta de cupones de GoodRx.  - Si necesita que su receta se enve electrnicamente a una farmacia diferente, informe a nuestra oficina a travs de MyChart de [place] o por telfono llamando al [phone] y presione la opcin 4.

## 2023-05-11 NOTE — Progress Notes (Addendum)
Follow-Up Visit   Subjective  Alisha Hughes is a 43 y.o. female who presents for the following: Skin Cancer Screening and Full Body Skin Exam  The patient presents for Total-Body Skin Exam (TBSE) for skin cancer screening and mole check. The patient has spots, moles and lesions to be evaluated, some may be new or changing and the patient may have concern these could be cancer.  Spot on L groin area gets irritated by underwear.   Also new spot on breat to check  The following portions of the chart were reviewed this encounter and updated as appropriate: medications, allergies, medical history  Review of Systems:  No other skin or systemic complaints except as noted in HPI or Assessment and Plan.  Objective  Well appearing patient in no apparent distress; mood and affect are within normal limits.  A full examination was performed including scalp, head, eyes, ears, nose, lips, neck, chest, axillae, abdomen, back, buttocks, bilateral upper extremities, bilateral lower extremities, hands, feet, fingers, toes, fingernails, and toenails. All findings within normal limits unless otherwise noted below.   Relevant physical exam findings are noted in the Assessment and Plan. L inguinal crease x 1 Erythematous stuck-on, waxy papule  Assessment & Plan   SKIN CANCER SCREENING PERFORMED TODAY.  ACTINIC DAMAGE - Chronic condition, secondary to cumulative UV/sun exposure - diffuse scaly erythematous macules with underlying dyspigmentation - Recommend daily broad spectrum sunscreen SPF 30+ to sun-exposed areas, reapply every 2 hours as needed.  - Staying in the shade or wearing long sleeves, sun glasses (UVA+UVB protection) and wide brim hats (4-inch brim around the entire circumference of the hat) are also recommended for sun protection.  - Call for new or changing lesions.  LENTIGINES, SEBORRHEIC KERATOSES, HEMANGIOMAS - Benign normal skin lesions - Benign-appearing - Call for any  changes  MELANOCYTIC NEVI - L lat cheek, flesh colored papule, discussed removal if bothersome. - Ant neck, flesh brown papule, discussed removal if bothersome. - Tan-brown and/or pink-flesh-colored symmetric macules and papules - Benign appearing on exam today - Observation - Call clinic for new or changing moles - Recommend daily use of broad spectrum spf 30+ sunscreen to sun-exposed areas.   FAMILY HISTORY OF SKIN CANCER What type(s): MM, BCC Who affected: Mother and father  SCAR - mid forehead, from cyst removal. Exam: Dyspigmented smooth macule or patch. Benign-appearing.  Observation.  Call clinic for new or changing lesions. Recommend daily broad spectrum sunscreen SPF 30+, reapply every 2 hours as needed. Treatment: Recommend Serica moisturizing scar formula cream every night or [facility] brand or Mederma silicone scar sheet every night for the first year after a scar appears to help with scar remodeling if desired. Scars remodel on their own for a full year and will gradually improve in appearance over time.  Xerosis - diffuse xerotic patches on the legs. - recommend gentle, hydrating skin care - gentle skin care handout given - Continue Amlactin moisturizer daily.  SEBORRHEIC KERATOSIS - Stuck-on, waxy, tan-brown papules, pedunculated at L nipple- pt defers removal today  - Benign-appearing - Discussed benign etiology and prognosis. - Observe - Call for any changes INFLAMED SEBORRHEIC KERATOSIS L inguinal crease x 1 Symptomatic, irritating, patient would like treated.  Destruction of lesion - L inguinal crease x 1  Destruction method: cryotherapy   Informed consent: discussed and consent obtained   Lesion destroyed using liquid nitrogen: Yes   Region frozen until ice ball extended beyond lesion: Yes   Outcome: patient tolerated procedure well  with no complications   Post-procedure details: wound care instructions given   Additional details:  Prior to procedure,  discussed risks of blister formation, small wound, skin dyspigmentation, or rare scar following cryotherapy. Recommend Vaseline ointment to treated areas while healing.  SKIN CANCER SCREENING   ACTINIC SKIN DAMAGE   LENTIGO   SEBORRHEIC KERATOSIS   HEMANGIOMA OF SKIN   NEVUS   XEROSIS CUTIS   SCAR   FAMILY HISTORY OF SKIN CANCER    Return in about 1 year (around 05/10/2024) for TBSE.  Maylene Roes, CMA, am acting as scribe for Willeen Niece, MD .  Documentation: I have reviewed the above documentation for accuracy and completeness, and I agree with the above.  Willeen Niece, MD

## 2023-06-12 ENCOUNTER — Ambulatory Visit: Payer: 59 | Admitting: Family Medicine

## 2023-06-24 ENCOUNTER — Ambulatory Visit (INDEPENDENT_AMBULATORY_CARE_PROVIDER_SITE_OTHER): Payer: 59 | Admitting: Obstetrics and Gynecology

## 2023-06-24 ENCOUNTER — Encounter: Payer: Self-pay | Admitting: Obstetrics and Gynecology

## 2023-06-24 VITALS — BP 128/90 | HR 76 | Wt 165.0 lb

## 2023-06-24 DIAGNOSIS — Z79890 Hormone replacement therapy (postmenopausal): Secondary | ICD-10-CM | POA: Diagnosis not present

## 2023-06-24 DIAGNOSIS — R5383 Other fatigue: Secondary | ICD-10-CM

## 2023-06-24 DIAGNOSIS — G479 Sleep disorder, unspecified: Secondary | ICD-10-CM

## 2023-06-24 NOTE — Progress Notes (Signed)
 Obstetrics and Gynecology Established Patient Evaluation  Appointment Date: 06/24/2023  OBGYN Clinic: [facility]  Primary Care Provider: Mort Hughes  Referring Provider: Mort Sawyers, Hughes  Chief Complaint: HRT check up  History of Present Illness: Alisha Hughes is a 43 y.o.  G2P1101 (Patient's last menstrual period was 12/14/2020 (approximate).), seen for the above chief complaint. Her past medical history is significant for sp 01/2021 TLH/BSO for bleeding, painful periods, recurrent ovarian cysts and h/o ablation, HTN.  Patient has been on vivelle dot patches since surgery, currently on 0.05 q3d and overall doing well on that. She does get hot flashes right when she's due to change it but no night sweats, vaginal bleeding/discharge or vaginal dryness. For the past few months, she's had some decreased energy and difficulty sleeping.   Review of Systems: A comprehensive review of systems was negative.   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   Overweight 08/12/2017   Irritable bowel syndrome with diarrhea 07/15/2016   Vitamin D deficiency 07/15/2016   Anxiety and depression 02/11/2016   Essential hypertension 02/08/2013   Chronic migraine without aura 12/07/2012    Past Medical History:  Past Medical History:  Diagnosis Date   Abnormal ECG 02/03/2018   Anxiety and depression    History of kidney stones    HSV (herpes simplex virus) infection 02/10/2018   Hyperlipidemia    Hypertension     Past Surgical History:  Past Surgical History:  Procedure Laterality Date   CESAREAN SECTION     CESAREAN SECTION N/A 12/19/2012   Procedure: Repeat cesarean section with delivery of baby girl at 60.    Bilateral tubal ligation with filshie clips.;  Surgeon: Reva Bores, MD;  Location: [facility];  Service: Obstetrics;  Laterality: N/A;   CHOLECYSTECTOMY  2010   COLONOSCOPY     2009   COLPOSCOPY VULVA W/ BIOPSY     abnormal pap   CYSTOSCOPY N/A 01/22/2021   Procedure: CYSTOSCOPY;  Surgeon: Toluca Bing, MD;  Location: [facility];  Service: Gynecology;  Laterality: N/A;   ENDOMETRIAL ABLATION N/A 07/17/2015   Procedure: ENDOMETRIAL ABLATION WITH HTA, DILATATION AND CURRATAGE;  Surgeon: Allie Bossier, MD;  Location: [facility];  Service: Gynecology;  Laterality: N/A;   KNEE ARTHROSCOPY     LAPAROSCOPIC LYSIS OF ADHESIONS  07/17/2015   Procedure: LAPAROSCOPIC LYSIS OF ADHESIONS;  Surgeon: Allie Bossier, MD;  Location: [facility];  Service: Gynecology;;   LAPAROSCOPY N/A 07/17/2015   Procedure: LAPAROSCOPY DIAGNOSTIC;  Surgeon: Allie Bossier, MD;  Location: [facility];  Service: Gynecology;  Laterality: N/A;   TOTAL LAPAROSCOPIC HYSTERECTOMY WITH BILATERAL SALPINGO OOPHORECTOMY Bilateral 01/22/2021   Procedure: TOTAL LAPAROSCOPIC HYSTERECTOMY WITH BILATERAL SALPINGO OOPHORECTOMY, OPEN VAGINAL  REPAIR OF VAGINAL CUFF;  Surgeon: Bethany Bing, MD;  Location: [facility];  Service: Gynecology;  Laterality: Bilateral;   TUBAL LIGATION     UPPER GI ENDOSCOPY     WISDOM TOOTH EXTRACTION     Past Obstetrical History:  OB History  Gravida Para Term Preterm AB Living  2 2 1 1  0 1  SAB IAB Ectopic Multiple Live Births  0 0 0  2    # Outcome Date GA Lbr Len/2nd Weight Sex Type Anes PTL Lv  2 Term 12/19/12 [redacted]w[redacted]d   F CS-LTranv Spinal  1 Preterm 12/11/09 [redacted]w[redacted]d   F CS-LTranv EPI  LIV   Past Gynecological History: As per HPI. History of Pap Smear(s): all normal and negative pathology on hyst  Social History:  Social History   Socioeconomic History   Marital status: Married    Spouse name: Not on file   Number of children: 2   Years of education: Not on file   Highest education level: Not on file  Occupational History    Employer: Engineer, agricultural  Tobacco Use   Smoking  status: Never   Smokeless tobacco: Never  Vaping Use   Vaping status: Never Used  Substance and Sexual Activity   Alcohol use: Yes    Comment: Rarely drink 1x a month if that, 1-2 margaritas   Drug use: No   Sexual activity: Yes    Partners: Male    Birth control/protection: Surgical  Other Topics Concern   Not on file  Social History Narrative   ** Merged History Encounter ** 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, 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   Two girls, one 9 and one 12 (2024)   Married -- 8 years   Fun: exercise, running, spending time with family, going to the park   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   Social Drivers of Corporate investment banker Strain: Not on file  Food Insecurity: Not on file  Transportation Needs: Not on file  Physical Activity: Not on file  Stress: Not on file  Social Connections: Not on file  Intimate Partner Violence: Not on file   Family History:  Family History  Problem Relation Age of Onset   Hypertension Mother    Anuerysm Mother        brain   Migraines Mother    Anxiety disorder Mother    Heart disease Father    Hypertension Father    Hyperlipidemia Father    Prostate cancer Father    Anxiety disorder Father    Diabetes Father    Vision loss Father    ADD / ADHD Daughter    Anxiety disorder Daughter    Epilepsy Daughter    Other Daughter        pots   Anxiety disorder Daughter    Depression Daughter    ADD / ADHD Daughter    Multiple sclerosis Maternal Grandmother    Sudden death Maternal Grandmother    Early death Maternal Grandmother    Heart disease Maternal Grandfather    Sudden death Maternal Grandfather    Early death Maternal Grandfather    Heart disease Paternal Grandmother    Diabetes Paternal Grandmother    Parkinson's disease Paternal Grandfather    Depression Brother    Anxiety disorder Brother    Breast cancer Neg Hx    Health Maintenance:  Mammogram(s): Yes.   Date: 09/2022  Medications Alisha Hughes had no  medications administered during this visit. Current Outpatient Medications  Medication Sig Dispense Refill   amLODipine (NORVASC) 10 MG tablet Take 1 tablet (10 mg total) by mouth daily. 90 tablet 3   estradiol (VIVELLE-DOT) 0.05 MG/24HR patch APPLY 1 PATCH ON THE SKIN TWICE A WEEK 24 patch 1   pramoxine-hydrocortisone (PROCTOCREAM-HC) 1-1 % rectal cream Place 1 Application rectally 2 (two) times daily. 30 g 0   desvenlafaxine (PRISTIQ) 50 MG 24 hr tablet Take 1 tablet (50 mg total) by mouth daily. (Patient not taking: Reported on 06/24/2023) 90 tablet 0   lisinopril (ZESTRIL) 40 MG tablet Take 1 tablet (  40 mg total) by mouth daily. 30 tablet 0   ondansetron (ZOFRAN-ODT) 4 MG disintegrating tablet TAKE 1 TABLET BY MOUTH EVERY 8 HOURS AS NEEDED FOR NAUSEA AND VOMITING (Patient not taking: Reported on 06/24/2023)     promethazine-dextromethorphan (PROMETHAZINE-DM) 6.25-15 MG/5ML syrup Take 5 mLs by mouth 4 (four) times daily as needed. (Patient not taking: Reported on 06/24/2023) 118 mL 0   valACYclovir (VALTREX) 500 MG tablet Take by mouth. (Patient not taking: Reported on 06/24/2023)     No current facility-administered medications for this visit.    Allergies Other, Latex, Metoclopramide, Sertraline, and Wellbutrin [bupropion]  Physical Exam:  Vitals:   06/24/23 0823 06/24/23 0832  BP: (!) 134/93 (!) 128/90  Pulse: 76   Weight: 165 lb (74.8 kg)    General appearance: Well nourished, well developed female in no acute distress.  Respiratory: Normal respiratory effort Neuro/Psych:  Normal mood and affect.   Laboratory: September 2024 cbc and cmp wnl.   Radiology: none  Assessment: patient stable  Plan: 1. Low energy (Primary) - TSH + free T4  2. Difficulty sleeping - TSH + free T4  3. Hormone replacement therapy (HRT) Patient happy s/p hyst and glad she got it; she even notes no more kidney stones since. Will get TSH for above s/s but since no climateric s/s when has new patch,  likely not HRT related. She was on the 0.075 dose right after surgery but had headaches. I told her that if TSH wnl then can consider trying the 0.075 dose again or can keep at 0.05 dose. Pt would like to do a trial of the higher dose. I told her if HA s/s re-occur to go back to the 0.05 dose. I also told her that patch HRT unlikely to affect BP but to use her home cuff with a dose increase to see if it affects.  - TSH + free T4  Orders Placed This Encounter  Procedures   TSH + free T4    RTC yearly  No follow-ups on file.  Future Appointments  Date Time Provider Department Center  05/11/2024  4:00 PM Elie Goody, MD [facility] None    Cornelia Copa MD Attending [facility])

## 2023-06-24 NOTE — Progress Notes (Signed)
 Patient presents for Annual. Pt notes she needed appt to continue Estradiol patches.  Last pap:  08/09/19 pt has had total HYST  Mammogram:  10/06/22 STD Screening: Declines   CC: Annual/None

## 2023-06-25 LAB — TSH+FREE T4
Free T4: 1.4 ng/dL (ref 0.82–1.77)
TSH: 1.39 u[IU]/mL (ref 0.450–4.500)

## 2023-06-29 ENCOUNTER — Encounter: Payer: Self-pay | Admitting: Obstetrics and Gynecology

## 2023-06-29 MED ORDER — ESTRADIOL 0.075 MG/24HR TD PTTW: 1.0000 | MEDICATED_PATCH | TRANSDERMAL | 5 refills | Status: AC

## 2023-06-29 NOTE — Addendum Note (Signed)
 Addended by: Cubero Bing on: 06/29/2023 10:36 AM   Modules accepted: Orders

## 2023-08-11 ENCOUNTER — Encounter: Payer: Self-pay | Admitting: Family

## 2023-08-12 ENCOUNTER — Other Ambulatory Visit (HOSPITAL_COMMUNITY): Payer: Self-pay

## 2023-08-12 ENCOUNTER — Ambulatory Visit (INDEPENDENT_AMBULATORY_CARE_PROVIDER_SITE_OTHER): Admitting: Family

## 2023-08-12 ENCOUNTER — Encounter: Payer: Self-pay | Admitting: Family

## 2023-08-12 ENCOUNTER — Telehealth: Payer: Self-pay

## 2023-08-12 VITALS — BP 126/80 | HR 85 | Temp 98.7°F | Ht 63.0 in | Wt 169.4 lb

## 2023-08-12 DIAGNOSIS — E782 Mixed hyperlipidemia: Secondary | ICD-10-CM

## 2023-08-12 DIAGNOSIS — I10 Essential (primary) hypertension: Secondary | ICD-10-CM

## 2023-08-12 DIAGNOSIS — Z6830 Body mass index (BMI) 30.0-30.9, adult: Secondary | ICD-10-CM | POA: Diagnosis not present

## 2023-08-12 MED ORDER — ZEPBOUND 5 MG/0.5ML ~~LOC~~ SOAJ
5.0000 mg | SUBCUTANEOUS | 0 refills | Status: DC
Start: 2023-09-02 — End: 2023-10-19

## 2023-08-12 MED ORDER — ZEPBOUND 7.5 MG/0.5ML ~~LOC~~ SOAJ: 7.5000 mg | SUBCUTANEOUS | 1 refills | Status: DC

## 2023-08-12 MED ORDER — ZEPBOUND 2.5 MG/0.5ML ~~LOC~~ SOAJ: 2.5000 mg | SUBCUTANEOUS | 0 refills | Status: DC

## 2023-08-12 NOTE — Progress Notes (Signed)
 Established Patient Office Visit  Subjective:   Patient ID: Alisha Hughes, female    DOB: 1980/06/07  Age: 43 y.o. MRN: [id number]  CC:  Chief Complaint  Patient presents with   Medical Management of Chronic Issues    Wants to discuss staring tirzepatide  for weight loss    HPI: Alisha Hughes is a 43 y.o. female presenting on 08/12/2023 for Medical Management of Chronic Issues (Wants to discuss staring tirzepatide  for weight loss)  Obesity: has tried semaglutide compound in the past which helped initially but then 'stopped working'. She was eating well and exercising but was without any weight loss after that. She thinks at this point she needs to try something different.   The beneficiary does not have any FDA labeled contraindications to the requested agent including pregnancy, lactation, h/o medullary thyroid  cancer or multiple endocrine neoplasia type II.   She goes to the gym five days a week, strength training two days a week at least 30 min at a time, walks often during the week, and cycles three days a week at 1 hour at a time.         ROS: Negative unless specifically indicated above in HPI.   Relevant past medical history reviewed and updated as indicated.   Allergies and medications reviewed and updated.   Current Outpatient Medications:    amLODipine  (NORVASC ) 10 MG tablet, Take 1 tablet (10 mg total) by mouth daily., Disp: 90 tablet, Rfl: 3   estradiol  (VIVELLE -DOT) 0.075 MG/24HR, Place 1 patch onto the skin 2 (two) times a week., Disp: 8 patch, Rfl: 5   ondansetron  (ZOFRAN -ODT) 4 MG disintegrating tablet, Take 4 mg by mouth every 8 (eight) hours as needed for vomiting or nausea., Disp: , Rfl:    tirzepatide  (ZEPBOUND ) 2.5 MG/0.5ML Pen, Inject 2.5 mg into the skin once a week. Inject 0.25 mg [place] weekly for four weeks, then increase to 0.5 mg weekly, Disp: 2 mL, Rfl: 0   [START ON 09/02/2023] tirzepatide  (ZEPBOUND ) 5 MG/0.5ML Pen, Inject 5 mg into the skin once a  week. Inject 0.5 mg [place] weekly, Disp: 2 mL, Rfl: 0   [START ON 09/23/2023] tirzepatide  (ZEPBOUND ) 7.5 MG/0.5ML Pen, Inject 7.5 mg into the skin once a week., Disp: 2 mL, Rfl: 1   valACYclovir  (VALTREX ) 500 MG tablet, Take by mouth., Disp: , Rfl:   Allergies  Allergen Reactions   Other     Surgical glue causes skin to be red and blotchy   Latex Rash   Metoclopramide Nausea And Vomiting and Other (See Comments)    Dizziness   Sertraline Rash   Wellbutrin  [Bupropion ] Other (See Comments)    drowsy    Objective:   BP 126/80 (BP Location: Left Arm, Patient Position: Sitting, Cuff Size: Normal)   Pulse 85   Temp 98.7 F (37.1 C) (Temporal)   Ht 5\' 3"  (1.6 m)   Wt 169 lb 6.4 oz (76.8 kg)   LMP 12/14/2020 (Approximate)   SpO2 95%   BMI 30.01 kg/m    Physical Exam Vitals reviewed.  Constitutional:      General: She is not in acute distress.    Appearance: Normal appearance. She is obese. She is not ill-appearing, toxic-appearing or diaphoretic.  HENT:     Head: Normocephalic.  Cardiovascular:     Rate and Rhythm: Normal rate and regular rhythm.  Pulmonary:     Effort: Pulmonary effort is normal.  Musculoskeletal:        General:  Normal range of motion.  Neurological:     General: No focal deficit present.     Mental Status: She is alert and oriented to person, place, and time. Mental status is at baseline.  Psychiatric:        Mood and Affect: Mood normal.        Behavior: Behavior normal.        Thought Content: Thought content normal.        Judgment: Judgment normal.     Assessment & Plan:  Body mass index (BMI) of 30.0-30.9 in adult Assessment & Plan: The beneficiary does not have any FDA labeled contraindications to the requested agent including pregnancy, lactation, h/o medullary thyroid  cancer or multiple endocrine neoplasia type II.   the patient has attempted to lose weight by reducing caloric intake by about 30% and completed at least 150 minutes of exercise  per week unless limited, and the patient has unfortunately not been able to achieve a 5% weight reduction with calorie deficit goals, exercise goals, and behavioral therapy for at least 6 months prior to drug therapy.She does have hyperlipidemia and also HTN so she is at a higher risk for heart disease/stroke long term so weight loss would be beneficial.   I think it is reasonable to trial start GLP 1. We will try zepbound  pt states requires prior auth with insurance but typically covered.    Orders: -     Zepbound ; Inject 5 mg into the skin once a week. Inject 0.5 mg [place] weekly  Dispense: 2 mL; Refill: 0 -     Zepbound ; Inject 2.5 mg into the skin once a week. Inject 0.25 mg [place] weekly for four weeks, then increase to 0.5 mg weekly  Dispense: 2 mL; Refill: 0 -     Zepbound ; Inject 7.5 mg into the skin once a week.  Dispense: 2 mL; Refill: 1  Essential hypertension Assessment & Plan: Pt advised of the following:  Continue medication as prescribed. Monitor blood pressure periodically and/or when you feel symptomatic. Goal is <130/90 on average. Ensure that you have rested for 30 minutes prior to checking your blood pressure. Record your readings and bring them to your next visit if necessary.work on a low sodium diet. Continue  amlodipine  10 mg once daily.   Orders: -     Zepbound ; Inject 5 mg into the skin once a week. Inject 0.5 mg [place] weekly  Dispense: 2 mL; Refill: 0 -     Zepbound ; Inject 2.5 mg into the skin once a week. Inject 0.25 mg [place] weekly for four weeks, then increase to 0.5 mg weekly  Dispense: 2 mL; Refill: 0 -     Zepbound ; Inject 7.5 mg into the skin once a week.  Dispense: 2 mL; Refill: 1  Mixed hyperlipidemia Assessment & Plan: Work on low cholesterol diet and exercise as tolerated   Orders: -     Zepbound ; Inject 5 mg into the skin once a week. Inject 0.5 mg [place] weekly  Dispense: 2 mL; Refill: 0 -     Zepbound ; Inject 2.5 mg into the skin once a week. Inject 0.25 mg [place]  weekly for four weeks, then increase to 0.5 mg weekly  Dispense: 2 mL; Refill: 0 -     Zepbound ; Inject 7.5 mg into the skin once a week.  Dispense: 2 mL; Refill: 1     Follow up plan: Return in about 3 months (around 11/11/2023) for f/u weight loss medication.  Felicita Horns,  FNP

## 2023-08-12 NOTE — Assessment & Plan Note (Signed)
 The beneficiary does not have any FDA labeled contraindications to the requested agent including pregnancy, lactation, h/o medullary thyroid  cancer or multiple endocrine neoplasia type II.   the patient has attempted to lose weight by reducing caloric intake by about 30% and completed at least 150 minutes of exercise per week unless limited, and the patient has unfortunately not been able to achieve a 5% weight reduction with calorie deficit goals, exercise goals, and behavioral therapy for at least 6 months prior to drug therapy.She does have hyperlipidemia and also HTN so she is at a higher risk for heart disease/stroke long term so weight loss would be beneficial.   I think it is reasonable to trial start GLP 1. We will try zepbound  pt states requires prior auth with insurance but typically covered.

## 2023-08-12 NOTE — Assessment & Plan Note (Signed)
Work on low cholesterol diet and exercise as tolerated  

## 2023-08-12 NOTE — Telephone Encounter (Signed)
 Pharmacy Patient Advocate Encounter  Received notification from [employer] that Prior Authorization for Zepbound  7.5MG /0.5ML pen-injectors has been APPROVED from 07/13/2023 to 04/08/2024. Ran test claim, Copay is $24.99. This test claim was processed through [facility]- copay amounts may vary at other pharmacies due to pharmacy/plan contracts, or as the patient moves through the different stages of their insurance plan.   PA #/Case ID/Reference #: [id number]

## 2023-08-12 NOTE — Telephone Encounter (Signed)
 Pharmacy Patient Advocate Encounter   Received notification from CoverMyMeds that prior authorization for Zepbound  7.5MG /0.5ML pen-injectors is required/requested.   Insurance verification completed.   The patient is insured through [employer] .   Per test claim: PA required; PA submitted to above mentioned insurance via CoverMyMeds Key/confirmation #/EOC  [id number] Status is pending

## 2023-08-12 NOTE — Assessment & Plan Note (Signed)
 Pt advised of the following:  Continue medication as prescribed. Monitor blood pressure periodically and/or when you feel symptomatic. Goal is <130/90 on average. Ensure that you have rested for 30 minutes prior to checking your blood pressure. Record your readings and bring them to your next visit if necessary.work on a low sodium diet. Continue  amlodipine  10 mg once daily.

## 2023-08-24 ENCOUNTER — Ambulatory Visit: Admitting: Family

## 2023-08-25 ENCOUNTER — Ambulatory Visit
Admission: RE | Admit: 2023-08-25 | Discharge: 2023-08-25 | Disposition: A | Discharge status: Home / Self Care | Source: Ambulatory Visit | Attending: Emergency Medicine | Admitting: Emergency Medicine

## 2023-08-25 VITALS — BP 129/80 | HR 90 | Temp 97.7°F | Resp 18

## 2023-08-25 DIAGNOSIS — J0100 Acute maxillary sinusitis, unspecified: Secondary | ICD-10-CM

## 2023-08-25 MED ORDER — AMOXICILLIN-POT CLAVULANATE 875-125 MG PO TABS: 1.0000 | ORAL_TABLET | Freq: Two times a day (BID) | ORAL | 0 refills | Status: DC

## 2023-08-25 NOTE — Discharge Instructions (Addendum)
 Take the Augmentin as directed.  Follow up with your primary care provider if your symptoms are not improving.

## 2023-08-25 NOTE — ED Provider Notes (Signed)
 Arlander Bellman    CSN: [id number] Arrival date & time: 08/25/23  0804      History   Chief Complaint Chief Complaint  Patient presents with   Facial Pain    Sinus infection - Entered by patient    HPI Alisha Hughes is a 43 y.o. female.  Patient presents with 1 week history of sinus congestion, sinus pressure, cough.  She had a sore throat but this has resolved.  Treatment attempted with OTC cold and allergy medications.  No fever or shortness of breath.  The history is provided by the patient and medical records.    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    Bilateral tubal ligation with filshie clips.;  Surgeon: Granville Layer, MD;  Location: [facility];  Service: Obstetrics;  Laterality: N/A;   CHOLECYSTECTOMY  2010   COLONOSCOPY     2009   COLPOSCOPY VULVA W/ BIOPSY     abnormal pap   CYSTOSCOPY N/A 01/22/2021   Procedure: CYSTOSCOPY;  Surgeon: Raynell Caller, MD;  Location: [facility];  Service: Gynecology;  Laterality: N/A;   ENDOMETRIAL ABLATION N/A 07/17/2015   Procedure: ENDOMETRIAL ABLATION WITH HTA, DILATATION AND CURRATAGE;  Surgeon:  Ana Balling, MD;  Location: [facility];  Service: Gynecology;  Laterality: N/A;   KNEE ARTHROSCOPY     LAPAROSCOPIC LYSIS OF ADHESIONS  07/17/2015   Procedure: LAPAROSCOPIC LYSIS OF ADHESIONS;  Surgeon: Ana Balling, MD;  Location: [facility];  Service: Gynecology;;   LAPAROSCOPY N/A 07/17/2015   Procedure: LAPAROSCOPY DIAGNOSTIC;  Surgeon: Ana Balling, MD;  Location: [facility];  Service: Gynecology;  Laterality: N/A;   TOTAL LAPAROSCOPIC HYSTERECTOMY WITH BILATERAL SALPINGO OOPHORECTOMY Bilateral 01/22/2021   Procedure: TOTAL LAPAROSCOPIC HYSTERECTOMY WITH BILATERAL SALPINGO OOPHORECTOMY, OPEN VAGINAL  REPAIR OF VAGINAL CUFF;  Surgeon: Raynell Caller, MD;  Location: [facility];  Service: Gynecology;  Laterality: Bilateral;   TUBAL LIGATION     UPPER GI ENDOSCOPY     WISDOM TOOTH EXTRACTION      OB History     Gravida  2   Para  2   Term  1   Preterm  1   AB  0   Living  1      SAB  0   IAB  0   Ectopic  0   Multiple      Live Births  2            Home Medications    Prior to Admission medications   Medication Sig Start Date End Date Taking? Authorizing Provider  amoxicillin -clavulanate (AUGMENTIN ) 875-125 MG tablet Take  1 tablet by mouth every 12 (twelve) hours. 08/25/23  Yes Wellington Half, NP  amLODipine  (NORVASC ) 10 MG tablet Take 1 tablet (10 mg total) by mouth daily. 07/11/20   Alexander Iba, PA  estradiol  (VIVELLE -DOT) 0.075 MG/24HR Place 1 patch onto the skin 2 (two) times a week. 06/29/23 08/11/24  Raynell Caller, MD  ondansetron  (ZOFRAN -ODT) 4 MG disintegrating tablet Take 4 mg by mouth every 8 (eight) hours as needed for vomiting or nausea. 12/28/20   [provider]  tirzepatide  (ZEPBOUND ) 2.5 MG/0.5ML Pen Inject 2.5 mg into the skin once a week. Inject 0.25 mg [place] weekly for four weeks, then increase to 0.5 mg weekly 08/12/23   Dugal, Tabitha, FNP  tirzepatide  (ZEPBOUND ) 5 MG/0.5ML Pen Inject 5 mg into the skin once a week. Inject 0.5 mg [place] weekly 09/02/23    Dugal, Tabitha, FNP  tirzepatide  (ZEPBOUND ) 7.5 MG/0.5ML Pen Inject 7.5 mg into the skin once a week. 09/23/23   Dugal, Tabitha, FNP  valACYclovir  (VALTREX ) 500 MG tablet Take by mouth. 02/10/18   [provider]    Family History Family History  Problem Relation Age of Onset   Hypertension Mother    Anuerysm Mother        brain   Migraines Mother    Anxiety disorder Mother    Heart disease Father    Hypertension Father    Hyperlipidemia Father    Prostate cancer Father    Anxiety disorder Father    Diabetes Father    Vision loss Father    ADD / ADHD Daughter    Anxiety disorder Daughter    Epilepsy Daughter    Other Daughter        pots   Anxiety disorder Daughter    Depression Daughter    ADD / ADHD Daughter    Multiple sclerosis Maternal Grandmother    Sudden death Maternal Grandmother    Early death Maternal Grandmother    Heart disease Maternal Grandfather    Sudden death Maternal Grandfather    Early death Maternal Grandfather    Heart disease Paternal Grandmother    Diabetes Paternal Grandmother    Parkinson's disease Paternal Grandfather    Depression Brother    Anxiety disorder Brother    Breast cancer Neg Hx     Social History Social History   Tobacco Use   Smoking status: Never   Smokeless tobacco: Never  Vaping Use   Vaping status: Never Used  Substance Use Topics   Alcohol use: Yes    Comment: Rarely drink 1x a month if that, 1-2 margaritas   Drug use: No     Allergies   Other, Latex, Metoclopramide, Sertraline, and Wellbutrin  [bupropion ]   Review of Systems Review of Systems  Constitutional:  Negative for chills and fever.  HENT:  Positive for congestion, postnasal drip and sinus pressure. Negative for ear pain and sore throat.   Respiratory:  Positive for cough. Negative for shortness of breath.      Physical Exam Triage Vital Signs ED Triage Vitals  Encounter Vitals Group     BP      Systolic BP Percentile       Diastolic BP Percentile      Pulse      Resp      Temp      Temp src      SpO2      Weight      Height      Head Circumference  Peak Flow      Pain Score      Pain Loc      Pain Education      Exclude from Growth Chart    No data found.  Updated Vital Signs BP 129/80   Pulse 90   Temp 97.7 F (36.5 C)   Resp 18   LMP 12/14/2020 (Approximate)   SpO2 99%   Visual Acuity Right Eye Distance:   Left Eye Distance:   Bilateral Distance:    Right Eye Near:   Left Eye Near:    Bilateral Near:     Physical Exam Constitutional:      General: She is not in acute distress. HENT:     Right Ear: Tympanic membrane normal.     Left Ear: Tympanic membrane normal.     Nose: Congestion present.     Mouth/Throat:     Mouth: Mucous membranes are moist.     Pharynx: Oropharynx is clear.  Cardiovascular:     Rate and Rhythm: Normal rate and regular rhythm.     Heart sounds: Normal heart sounds.  Pulmonary:     Effort: Pulmonary effort is normal. No respiratory distress.     Breath sounds: Normal breath sounds.  Neurological:     Mental Status: She is alert.      UC Treatments / Results  Labs (all labs ordered are listed, but only abnormal results are displayed) Labs Reviewed - No data to display  EKG   Radiology No results found.  Procedures Procedures (including critical care time)  Medications Ordered in UC Medications - No data to display  Initial Impression / Assessment and Plan / UC Course  I have reviewed the triage vital signs and the nursing notes.  Pertinent labs & imaging results that were available during my care of the patient were reviewed by me and considered in my medical decision making (see chart for details).    Acute sinusitis.  Afebrile and vital signs are stable.  Lungs are clear and O2 sat is 99%.  Treating sinus infection with Augmentin .  Tylenol  or ibuprofen  as needed.  Plain Mucinex as needed.  Instructed patient to follow-up with  her PCP if she is not improving.  She agrees to plan of care.  Final Clinical Impressions(s) / UC Diagnoses   Final diagnoses:  Acute non-recurrent maxillary sinusitis     Discharge Instructions      Take the Augmentin  as directed.  Follow-up with your primary care provider if your symptoms are not improving.    ED Prescriptions     Medication Sig Dispense Auth. Provider   amoxicillin -clavulanate (AUGMENTIN ) 875-125 MG tablet Take 1 tablet by mouth every 12 (twelve) hours. 14 tablet Wellington Half, NP      PDMP not reviewed this encounter.   Wellington Half, NP 08/25/23 [phone]

## 2023-08-25 NOTE — ED Triage Notes (Signed)
 Patient to Urgent Care with complaints of sinus pressure/ nasal congestion/ productive cough. "Low grade" fever yesterday.   Symptoms started one week ago.  Meds: Nyquil/ Dayquil/ otc allergy medicine.

## 2023-08-26 ENCOUNTER — Ambulatory Visit: Admitting: Family

## 2023-08-27 ENCOUNTER — Telehealth: Admitting: Physician Assistant

## 2023-08-27 DIAGNOSIS — T3695XA Adverse effect of unspecified systemic antibiotic, initial encounter: Secondary | ICD-10-CM | POA: Diagnosis not present

## 2023-08-27 DIAGNOSIS — B379 Candidiasis, unspecified: Secondary | ICD-10-CM | POA: Diagnosis not present

## 2023-08-27 MED ORDER — FLUCONAZOLE 150 MG PO TABS: ORAL_TABLET | ORAL | 0 refills | Status: DC

## 2023-08-27 NOTE — Progress Notes (Signed)

## 2023-08-27 NOTE — Progress Notes (Signed)
 I have spent 5 minutes in review of e-visit questionnaire, review and updating patient chart, medical decision making and response to patient.   Piedad Climes, PA-C

## 2023-09-03 ENCOUNTER — Ambulatory Visit: Admitting: Certified Nurse Midwife

## 2023-09-03 ENCOUNTER — Other Ambulatory Visit (HOSPITAL_COMMUNITY)
Admission: RE | Admit: 2023-09-03 | Discharge: 2023-09-03 | Disposition: A | Discharge status: Home / Self Care | Source: Ambulatory Visit | Attending: Certified Nurse Midwife | Admitting: Certified Nurse Midwife

## 2023-09-03 VITALS — BP 134/89 | HR 69 | Wt 167.0 lb

## 2023-09-03 DIAGNOSIS — B3731 Acute candidiasis of vulva and vagina: Secondary | ICD-10-CM | POA: Diagnosis present

## 2023-09-03 MED ORDER — TERCONAZOLE 0.4 % VA CREA: 1.0000 | TOPICAL_CREAM | Freq: Every day | VAGINAL | 0 refills | Status: DC

## 2023-09-03 NOTE — Progress Notes (Signed)
 CC: Yeast Infection   Antibiotic induced yeast infection- Fluconazole  sent in by telehealth-   Fluconazole  didn't help, feels like razor blades in vaginal area     Wanting swab

## 2023-09-03 NOTE — Patient Instructions (Signed)
 Alternative Vaginitis Therapies  1) soak in tub of warm water waist high with 1/2 cup of baking soda in water for ~ 20 mins.   Both options are to be done after sexual intercourse, menses and/or when suspects Bacterial Vaginosis and/or yeast infection. Please be advised that these alternatives will not replace the need to be evaluated, if symptoms persist. You will need to seek care at an OB/GYN provider.  GO WHITE: Soap: UNSCENTED Dove (white box light green writing) Laundry detergent (underwear)- Dreft or Arm n[employer] Hammer unscented WHITE 100% cotton panties (NOT just cotton crouch) Sanitary napkin/panty liners: UNSCENTED.  If it doesn't SAY unscented it can have a scent/perfume    NO PERFUMES OR LOTIONS OR POTIONS in the vulvar (lips) area (may use water-based or silicone-based lubricant) Condoms: hypoallergenic only. Non dyed (no color) Toilet papers: white only Wash clothes: use a separate wash cloth. WHITE.  Wash in [place].   [facility] [address] [place], [place] [address] [phone]  [facility] [address] [place], [place] [address] [phone]

## 2023-09-03 NOTE — Progress Notes (Signed)
 History:  Ms. Alisha Hughes is a 43 y.o. G2P1101 who presents to clinic today for vaginal discomfort - she reports "feeling like there are razor blades" around her vulva, clitoris and vaginal area in general. Recently treated with antibiotics and a subsequent vaginal yeast infection with diflucan  x2. She reports no improvement. Has not tried anything else.    The following portions of the patient's history were reviewed and updated as appropriate: allergies, current medications, family history, past medical history, social history, past surgical history and problem list.  Review of Systems:  Review of Systems  All other systems reviewed and are negative.     Objective:  Physical Exam BP 134/89   Pulse 69   Wt 167 lb (75.8 kg)   LMP 12/14/2020 (Approximate)   BMI 29.58 kg/m  Physical Exam Exam conducted with a chaperone present.  Constitutional:      Appearance: Normal appearance. She is normal weight.  HENT:     Head: Normocephalic.  Cardiovascular:     Rate and Rhythm: Normal rate.     Pulses: Normal pulses.  Pulmonary:     Effort: Pulmonary effort is normal.  Genitourinary:    Labia:        Right: Rash and tenderness present.        Left: Rash and tenderness present.      Comments: Erythematous throughout perineal and vulva consistent with candidiasis.  Musculoskeletal:        General: Normal range of motion.  Skin:    General: Skin is warm and dry.  Neurological:     Mental Status: She is alert and oriented to person, place, and time.  Psychiatric:        Mood and Affect: Mood normal.        Behavior: Behavior normal.        Thought Content: Thought content normal.        Judgment: Judgment normal.      Labs and Imaging No results found for this or any previous visit (from the past 24 hours).  No results found.   Assessment & Plan:  1. Vaginal candidiasis (Primary) - Concern for candidiasis resistent to Diflucan  x2. Family history of DM, will draw HgbA1c  as it is not noted on recent lab work.   - Cervicovaginal ancillary only: will test for yeast and BV - Hemoglobin A1c - terconazole (TERAZOL 7) 0.4 % vaginal cream; Place 1 applicator vaginally at bedtime. Use for seven days  Dispense: 45 g; Refill: 0   Salomon Cree, CNM 09/03/2023 1:05 PM

## 2023-09-04 ENCOUNTER — Ambulatory Visit: Payer: Self-pay | Admitting: Certified Nurse Midwife

## 2023-09-04 LAB — HEMOGLOBIN A1C
Est. average glucose Bld gHb Est-mCnc: 108 mg/dL
Hgb A1c MFr Bld: 5.4 % (ref 4.8–5.6)

## 2023-09-07 ENCOUNTER — Telehealth: Admitting: Physician Assistant

## 2023-09-07 DIAGNOSIS — B379 Candidiasis, unspecified: Secondary | ICD-10-CM | POA: Diagnosis not present

## 2023-09-07 DIAGNOSIS — T3695XA Adverse effect of unspecified systemic antibiotic, initial encounter: Secondary | ICD-10-CM

## 2023-09-07 DIAGNOSIS — B9689 Other specified bacterial agents as the cause of diseases classified elsewhere: Secondary | ICD-10-CM

## 2023-09-07 DIAGNOSIS — J019 Acute sinusitis, unspecified: Secondary | ICD-10-CM

## 2023-09-07 MED ORDER — DOXYCYCLINE HYCLATE 100 MG PO TABS: 100.0000 mg | ORAL_TABLET | Freq: Two times a day (BID) | ORAL | 0 refills | Status: DC

## 2023-09-07 MED ORDER — FLUCONAZOLE 150 MG PO TABS: 150.0000 mg | ORAL_TABLET | ORAL | 0 refills | Status: DC | PRN

## 2023-09-07 NOTE — Progress Notes (Signed)

## 2023-09-08 LAB — CERVICOVAGINAL ANCILLARY ONLY
Bacterial Vaginitis (gardnerella): NEGATIVE
Candida Glabrata: NEGATIVE
Candida Vaginitis: POSITIVE — AB
Comment: NEGATIVE
Comment: NEGATIVE
Comment: NEGATIVE
Molecular Amendment: NEGATIVE

## 2023-09-22 ENCOUNTER — Ambulatory Visit: Payer: Self-pay

## 2023-09-22 NOTE — Telephone Encounter (Signed)
 NOTED

## 2023-09-22 NOTE — Telephone Encounter (Signed)
 Noted, will evaluate.

## 2023-09-22 NOTE — Telephone Encounter (Signed)
    FYI Only or Action Required?: FYI only for provider  Patient was last seen in primary care on 08/12/2023 by Felicita Horns, FNP. Called Nurse Triage reporting Hypertension. Symptoms began today. Interventions attempted: Prescription medications: tylenol . Symptoms are: unchanged.  Triage Disposition: No disposition on file.  Patient/caregiver understands and will follow disposition?: Copied from CRM [id number]. Topic: Clinical - Red Word Triage >> Sep 22, 2023  7:38 AM Alisha Hughes wrote: Red Word that prompted transfer to Nurse Triage: patient calling BP is 140/110 and headache Reason for Disposition  Systolic BP  >= 180 OR Diastolic >= 110  Answer Assessment - Initial Assessment Questions 1. BLOOD PRESSURE: "What is the blood pressure?" "Did you take at least two measurements 5 minutes apart?"     140/110 2. ONSET: "When did you take your blood pressure?"     This am 3. HOW: "How did you take your blood pressure?" (e.g., automatic home BP monitor, visiting nurse)     automatic 4. HISTORY: "Do you have a history of high blood pressure?"     yes 5. MEDICINES: "Are you taking any medicines for blood pressure?" "Have you missed any doses recently?"     no 6. OTHER SYMPTOMS: "Do you have any symptoms?" (e.g., blurred vision, chest pain, difficulty breathing, headache, weakness)     Headache,  7. PREGNANCY: "Is there any chance you are pregnant?" "When was your last menstrual period?"     na  Protocols used: Blood Pressure - High-A-AH

## 2023-09-23 ENCOUNTER — Ambulatory Visit (INDEPENDENT_AMBULATORY_CARE_PROVIDER_SITE_OTHER): Admitting: Primary Care

## 2023-09-23 ENCOUNTER — Encounter: Payer: Self-pay | Admitting: *Deleted

## 2023-09-23 ENCOUNTER — Encounter: Payer: Self-pay | Admitting: Primary Care

## 2023-09-23 VITALS — BP 112/96 | HR 95 | Temp 97.3°F | Ht 63.0 in | Wt 160.0 lb

## 2023-09-23 DIAGNOSIS — Z8249 Family history of ischemic heart disease and other diseases of the circulatory system: Secondary | ICD-10-CM | POA: Diagnosis not present

## 2023-09-23 DIAGNOSIS — I10 Essential (primary) hypertension: Secondary | ICD-10-CM

## 2023-09-23 MED ORDER — LISINOPRIL 20 MG PO TABS: 20.0000 mg | ORAL_TABLET | Freq: Every day | ORAL | 0 refills | Status: DC

## 2023-09-23 NOTE — Assessment & Plan Note (Signed)
 Reviewed MRA brain from 2015 through [facility] Everywhere. Repeat MRA brain ordered and pending.

## 2023-09-23 NOTE — Assessment & Plan Note (Signed)
 Diastolic readings are uncontrolled.  We discussed the headaches today could be secondary to hypertension or the return of her migraines. Given her extensive family history, will start with hypertension treatment.  Stop amlodipine  10 mg. Start lisinopril  20 mg to see if this is more effective.   Follow-up in 2 weeks with PCP. She will continue to monitor blood pressure readings at home.

## 2023-09-23 NOTE — Progress Notes (Signed)
 Subjective:    Patient ID: Alisha Hughes, female    DOB: 1980/05/12, 43 y.o.   MRN: [id number]  Hypertension Associated symptoms include headaches.    Alisha Hughes is a very pleasant 43 y.o. female patient of Tabitha, NP with a history of hypertension, migraines, IBS, anxiety depression, hyperlipidemia, total hysterectomy on HRT who presents today to discuss hypertension.  She contacted our office yesterday with reports of elevated blood pressure readings of 140/110 despite compliance to amlodipine  10 mg daily.  Because of her elevated blood pressure reading she was scheduled for an office visit.  Today she discusses that she woke up four days ago with a left sided frontal and occipital lobe headache. She took Ibuprofen  and Tylenol  without improvement so she began checking her BP. BP readings are ranging 113/95, 124/102, 152/130.   Yesterday she took some Excedrin Migraine and noticed some improvement in her headache which is mild today. She has noticed nausea, photophobia, phonophobia.  Her headache does not feel like her typical migraine. Last migraine was 4-5 years ago after nerve block. Currently managed on amlodipine  10 mg daily, no missed doses.   She's been under a lot of stress. Also a personal history of preeclampsia with both pregnancies. She has a family history of hypertension, brain aneurysm in mother, hypertension and heart disease in father. She has not had a MRI brain within the last 10 years.   She was once managed on lisinopril , does not recall having side effects or problems.   BP Readings from Last 3 Encounters:  09/23/23 (!) 112/96  09/03/23 134/89  08/25/23 129/80     Review of Systems  Eyes:  Positive for photophobia.  Gastrointestinal:  Positive for nausea.  Neurological:  Positive for headaches.  Psychiatric/Behavioral:  The patient is nervous/anxious.          Past Medical History:  Diagnosis Date   Abnormal ECG 02/03/2018   Anxiety and  depression    History of kidney stones    HSV (herpes simplex virus) infection 02/10/2018   Hyperlipidemia    Hypertension     Social History   Socioeconomic History   Marital status: Married    Spouse name: Not on file   Number of children: 2   Years of education: Not on file   Highest education level: Not on file  Occupational History    Employer: Engineer, agricultural  Tobacco Use   Smoking status: Never   Smokeless tobacco: Never  Vaping Use   Vaping status: Never Used  Substance and Sexual Activity   Alcohol use: Yes    Comment: Rarely drink 1x a month if that, 1-2 margaritas   Drug use: No   Sexual activity: Yes    Partners: Male    Birth control/protection: Surgical  Other Topics Concern   Not on file  Social History Narrative   ** Merged History Encounter ** 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, 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    Bilateral tubal ligation with filshie clips.;  Surgeon: Granville Layer, MD;  Location: [facility];  Service: Obstetrics;  Laterality: N/A;   CHOLECYSTECTOMY  2010   COLONOSCOPY     2009   COLPOSCOPY VULVA W/ BIOPSY     abnormal pap   CYSTOSCOPY N/A 01/22/2021   Procedure: CYSTOSCOPY;  Surgeon: Raynell Caller, MD;  Location: [facility];  Service: Gynecology;  Laterality: N/A;   ENDOMETRIAL ABLATION N/A 07/17/2015   Procedure: ENDOMETRIAL ABLATION WITH HTA,  DILATATION AND CURRATAGE;  Surgeon: Ana Balling, MD;  Location: [facility];  Service: Gynecology;  Laterality: N/A;   KNEE ARTHROSCOPY     LAPAROSCOPIC LYSIS OF ADHESIONS  07/17/2015   Procedure: LAPAROSCOPIC LYSIS OF ADHESIONS;  Surgeon: Ana Balling, MD;  Location: [facility];  Service: Gynecology;;   LAPAROSCOPY N/A 07/17/2015   Procedure: LAPAROSCOPY DIAGNOSTIC;  Surgeon: Ana Balling, MD;  Location: [facility];  Service: Gynecology;  Laterality: N/A;   TOTAL LAPAROSCOPIC HYSTERECTOMY WITH BILATERAL SALPINGO OOPHORECTOMY Bilateral 01/22/2021   Procedure: TOTAL LAPAROSCOPIC HYSTERECTOMY WITH BILATERAL SALPINGO OOPHORECTOMY, OPEN VAGINAL  REPAIR OF VAGINAL CUFF;  Surgeon: Raynell Caller, MD;  Location: [facility];  Service: Gynecology;  Laterality: Bilateral;   TUBAL LIGATION     UPPER GI ENDOSCOPY     WISDOM TOOTH EXTRACTION      Family History  Problem Relation Age of Onset   Hypertension Mother    Anuerysm Mother        brain   Migraines Mother    Anxiety disorder Mother    Heart disease Father    Hypertension Father    Hyperlipidemia Father    Prostate cancer Father    Anxiety disorder Father    Diabetes Father    Vision loss Father    ADD / ADHD Daughter    Anxiety disorder Daughter    Epilepsy Daughter    Other Daughter        pots   Anxiety disorder Daughter    Depression Daughter    ADD / ADHD Daughter    Multiple sclerosis Maternal Grandmother    Sudden death Maternal Grandmother    Early death Maternal Grandmother    Heart disease Maternal Grandfather    Sudden death Maternal Grandfather    Early death Maternal Grandfather    Heart disease Paternal Grandmother    Diabetes Paternal Grandmother    Parkinson's disease Paternal Grandfather    Depression Brother    Anxiety disorder Brother    Breast cancer Neg Hx     Allergies  Allergen Reactions   Other     Surgical glue causes skin to be red and blotchy   Latex Rash   Metoclopramide Nausea And Vomiting and Other (See  Comments)    Dizziness   Sertraline Rash   Wellbutrin  [Bupropion ] Other (See Comments)    drowsy    Current Outpatient Medications on File Prior to Visit  Medication Sig Dispense Refill   estradiol  (VIVELLE -DOT) 0.075 MG/24HR Place 1 patch onto the skin 2 (two) times a week. 8 patch 5   tirzepatide  (ZEPBOUND ) 5 MG/0.5ML Pen Inject 5 mg into the skin once a week. Inject 0.5 mg [place] weekly 2 mL 0   valACYclovir  (VALTREX ) 500 MG tablet Take by mouth.     terconazole  (TERAZOL 7 ) 0.4 %  vaginal cream Place 1 applicator vaginally at bedtime. Use for seven days (Patient not taking: Reported on 09/23/2023) 45 g 0   tirzepatide  (ZEPBOUND ) 2.5 MG/0.5ML Pen Inject 2.5 mg into the skin once a week. Inject 0.25 mg [place] weekly for four weeks, then increase to 0.5 mg weekly (Patient not taking: Reported on 09/23/2023) 2 mL 0   tirzepatide  (ZEPBOUND ) 7.5 MG/0.5ML Pen Inject 7.5 mg into the skin once a week. (Patient not taking: Reported on 09/23/2023) 2 mL 1   No current facility-administered medications on file prior to visit.    BP (!) 112/96   Pulse 95   Temp (!) 97.3 F (36.3 C) (Temporal)   Ht 5' 3 (1.6 m)   Wt 160 lb (72.6 kg)   LMP 12/14/2020 (Approximate)   SpO2 97%   BMI 28.34 kg/m  Objective:   Physical Exam Cardiovascular:     Rate and Rhythm: Normal rate and regular rhythm.  Pulmonary:     Effort: Pulmonary effort is normal.     Breath sounds: Normal breath sounds.  Musculoskeletal:     Cervical back: Neck supple.  Skin:    General: Skin is warm and dry.  Neurological:     Mental Status: She is alert and oriented to person, place, and time.  Psychiatric:        Mood and Affect: Mood normal.           Assessment & Plan:  Essential hypertension Assessment & Plan: Diastolic readings are uncontrolled.  We discussed the headaches today could be secondary to hypertension or the return of her migraines. Given her extensive family history, will start with hypertension  treatment.  Stop amlodipine  10 mg. Start lisinopril  20 mg to see if this is more effective.   Follow-up in 2 weeks with PCP. She will continue to monitor blood pressure readings at home.  Orders: -     MR ANGIO HEAD WO CONTRAST; Future -     Lisinopril ; Take 1 tablet (20 mg total) by mouth daily. for blood pressure.  Dispense: 90 tablet; Refill: 0  Family history of brain aneurysm Assessment & Plan: Reviewed MRA brain from 2015 through Care Everywhere. Repeat MRA brain ordered and pending.  Orders: -     MR ANGIO HEAD WO CONTRAST; Future        Helyne Genther K Ceferino Lang, NP

## 2023-09-23 NOTE — Patient Instructions (Signed)
 Stop taking amlodipine  for blood pressure.  Start lisinopril  20 mg once daily for blood pressure.  You will receive a phone call regarding the MRA of your brain.  Schedule a follow-up visit with your PCP in 2 weeks for blood pressure check.  It was a pleasure meeting you!

## 2023-09-24 ENCOUNTER — Ambulatory Visit: Admitting: Family Medicine

## 2023-09-30 ENCOUNTER — Other Ambulatory Visit

## 2023-10-01 ENCOUNTER — Ambulatory Visit
Admission: RE | Admit: 2023-10-01 | Discharge: 2023-10-01 | Disposition: A | Discharge status: Home / Self Care | Source: Ambulatory Visit | Attending: Primary Care | Admitting: Primary Care

## 2023-10-01 DIAGNOSIS — Z8249 Family history of ischemic heart disease and other diseases of the circulatory system: Secondary | ICD-10-CM | POA: Diagnosis present

## 2023-10-01 DIAGNOSIS — I10 Essential (primary) hypertension: Secondary | ICD-10-CM | POA: Diagnosis present

## 2023-10-09 ENCOUNTER — Ambulatory Visit: Admitting: Family

## 2023-10-14 ENCOUNTER — Ambulatory Visit: Payer: Self-pay | Admitting: Primary Care

## 2023-10-18 ENCOUNTER — Other Ambulatory Visit: Payer: Self-pay | Admitting: Family

## 2023-10-18 DIAGNOSIS — Z6830 Body mass index (BMI) 30.0-30.9, adult: Secondary | ICD-10-CM

## 2023-10-18 DIAGNOSIS — I10 Essential (primary) hypertension: Secondary | ICD-10-CM

## 2023-10-18 DIAGNOSIS — E782 Mixed hyperlipidemia: Secondary | ICD-10-CM

## 2023-10-19 MED ORDER — ZEPBOUND 5 MG/0.5ML ~~LOC~~ SOAJ
5.0000 mg | SUBCUTANEOUS | 0 refills | Status: DC
Start: 2023-10-19 — End: 2023-10-20

## 2023-10-19 NOTE — Addendum Note (Signed)
 Addended by: CORWIN ANTU on: 10/19/2023 02:06 PM   Modules accepted: Orders

## 2023-10-20 MED ORDER — ZEPBOUND 5 MG/0.5ML ~~LOC~~ SOAJ: 5.0000 mg | SUBCUTANEOUS | 0 refills | Status: DC

## 2023-10-20 NOTE — Addendum Note (Signed)
 Addended by: TENNIE RAISIN B on: 10/20/2023 10:00 AM   Modules accepted: Orders

## 2023-10-21 NOTE — Telephone Encounter (Signed)
 Will send to prior auth dpt

## 2023-10-23 ENCOUNTER — Telehealth: Payer: Self-pay

## 2023-10-23 ENCOUNTER — Other Ambulatory Visit (HOSPITAL_COMMUNITY): Payer: Self-pay

## 2023-10-23 NOTE — Telephone Encounter (Signed)
 PA request has been Submitted. New Encounter has been or will be created for follow up. For additional info see Pharmacy Prior Auth telephone encounter from 10/23/2023.

## 2023-10-23 NOTE — Telephone Encounter (Signed)
 Pharmacy Patient Advocate Encounter   Received notification from RX Request Messages that prior authorization for Zepbound  5MG /0.5ML pen-injectors is required/requested.   Insurance verification completed.   The patient is insured through [employer] .   Per test claim: PA required; PA submitted to above mentioned insurance via CoverMyMeds Key/confirmation #[id number] Status is pending

## 2023-10-29 ENCOUNTER — Other Ambulatory Visit (HOSPITAL_COMMUNITY): Payer: Self-pay

## 2023-10-29 ENCOUNTER — Encounter: Payer: Self-pay | Admitting: Family

## 2023-10-29 NOTE — Telephone Encounter (Signed)
 Pharmacy Patient Advocate Encounter  Received notification from [facility] [employer] that Prior Authorization for Zepbound  5 has been APPROVED from 10/23/23 to 06/19/24. Ran test claim, Copay is $24.99. This test claim was processed through [facility]- copay amounts may vary at other pharmacies due to pharmacy/plan contracts, or as the patient moves through the different stages of their insurance plan.   PA #/Case ID/Reference #: [id number]

## 2023-10-29 NOTE — Telephone Encounter (Signed)
 Do we know if this was approved or denied yet?

## 2023-11-10 ENCOUNTER — Other Ambulatory Visit: Payer: Self-pay

## 2023-11-10 DIAGNOSIS — Z1231 Encounter for screening mammogram for malignant neoplasm of breast: Secondary | ICD-10-CM

## 2023-11-10 NOTE — Progress Notes (Signed)
 Pt seen for Annual already needed Mammogram order.

## 2023-11-11 ENCOUNTER — Other Ambulatory Visit: Payer: Self-pay | Admitting: Family

## 2023-11-11 DIAGNOSIS — Z1231 Encounter for screening mammogram for malignant neoplasm of breast: Secondary | ICD-10-CM

## 2023-11-26 ENCOUNTER — Encounter: Payer: Self-pay | Admitting: Family Medicine

## 2023-11-26 DIAGNOSIS — N3090 Cystitis, unspecified without hematuria: Secondary | ICD-10-CM

## 2023-11-27 ENCOUNTER — Encounter

## 2023-11-30 MED ORDER — NITROFURANTOIN MONOHYD MACRO 100 MG PO CAPS: 100.0000 mg | ORAL_CAPSULE | Freq: Once | ORAL | 1 refills | Status: AC

## 2023-12-02 ENCOUNTER — Ambulatory Visit
Admission: RE | Admit: 2023-12-02 | Discharge: 2023-12-02 | Disposition: A | Discharge status: Home / Self Care | Source: Ambulatory Visit | Attending: Family | Admitting: Family

## 2023-12-02 DIAGNOSIS — Z1231 Encounter for screening mammogram for malignant neoplasm of breast: Secondary | ICD-10-CM | POA: Diagnosis present

## 2023-12-07 ENCOUNTER — Ambulatory Visit: Payer: Self-pay | Admitting: Family

## 2023-12-18 ENCOUNTER — Other Ambulatory Visit: Payer: Self-pay | Admitting: Primary Care

## 2023-12-18 DIAGNOSIS — I10 Essential (primary) hypertension: Secondary | ICD-10-CM

## 2023-12-28 ENCOUNTER — Ambulatory Visit (INDEPENDENT_AMBULATORY_CARE_PROVIDER_SITE_OTHER): Admitting: Family

## 2023-12-28 VITALS — BP 122/86 | HR 72 | Temp 98.2°F | Ht 63.0 in | Wt 150.2 lb

## 2023-12-28 DIAGNOSIS — E663 Overweight: Secondary | ICD-10-CM

## 2023-12-28 DIAGNOSIS — E782 Mixed hyperlipidemia: Secondary | ICD-10-CM | POA: Diagnosis not present

## 2023-12-28 DIAGNOSIS — Z6826 Body mass index (BMI) 26.0-26.9, adult: Secondary | ICD-10-CM | POA: Insufficient documentation

## 2023-12-28 DIAGNOSIS — I10 Essential (primary) hypertension: Secondary | ICD-10-CM | POA: Diagnosis not present

## 2023-12-28 MED ORDER — ZEPBOUND 7.5 MG/0.5ML ~~LOC~~ SOAJ: 7.5000 mg | SUBCUTANEOUS | 0 refills | Status: DC

## 2023-12-28 NOTE — Progress Notes (Signed)
 Established Patient Office Visit  Subjective:      CC:  Chief Complaint  Patient presents with   Medical Management of Chronic Issues    Follow up on zepbound  usage.    HPI: Alisha Hughes is a 43 y.o. female presenting on 12/28/2023 for Medical Management of Chronic Issues (Follow up on zepbound  usage.) .  Discussed the use of AI scribe software for clinical note transcription with the patient, who gave verbal consent to proceed.  History of Present Illness Alisha Hughes is a 43 year old female who presents for follow-up regarding her weight management medication.  She was off her weight management medication for about a month due to an insurance change, which delayed the prescription process. She has been back on the medication for about a month now. Initially, she was on a dose of 2.5 mg, but after the break, she resumed at 2.5 mg. She is currently on a 5 mg dose and plans to increase to 7.5 mg.  She has experienced no side effects from the medication, even after the break. There has been significant weight loss since starting the medication, with a reduction from 169 pounds in April to 150 pounds currently, totaling a loss of 19 pounds. She reports noticing less puffiness when taking the medication regularly.  For exercise, she engages in spin classes twice a week and is looking to incorporate more strength training into her routine. She is actively searching for a gym to facilitate this.  Wt Readings from Last 3 Encounters:  12/28/23 150 lb 3.2 oz (68.1 kg)  09/23/23 160 lb (72.6 kg)  09/03/23 167 lb (75.8 kg)            Social history:  Relevant past medical, surgical, family and social history reviewed and updated as indicated. Interim medical history since our last visit reviewed.  Allergies and medications reviewed and updated.  DATA REVIEWED: CHART IN EPIC     ROS: Negative unless specifically indicated above in HPI.    Current Outpatient  Medications:    estradiol  (VIVELLE -DOT) 0.075 MG/24HR, Place 1 patch onto the skin 2 (two) times a week., Disp: 8 patch, Rfl: 5   lisinopril  (ZESTRIL ) 20 MG tablet, TAKE 1 TABLET (20 MG TOTAL) BY MOUTH DAILY. FOR BLOOD PRESSURE., Disp: 30 tablet, Rfl: 2   tirzepatide  (ZEPBOUND ) 7.5 MG/0.5ML Pen, Inject 7.5 mg into the skin once a week., Disp: 2 mL, Rfl: 0   valACYclovir  (VALTREX ) 500 MG tablet, Take by mouth., Disp: , Rfl:         Objective:        BP 122/86 (BP Location: Left Arm, Patient Position: Sitting, Cuff Size: Normal)   Pulse 72   Temp 98.2 F (36.8 C) (Temporal)   Ht 5' 3 (1.6 m)   Wt 150 lb 3.2 oz (68.1 kg)   LMP 12/14/2020 (Approximate)   SpO2 98%   BMI 26.61 kg/m   Physical Exam MEASUREMENTS: Weight- 150, BMI- 26.0.  Wt Readings from Last 3 Encounters:  12/28/23 150 lb 3.2 oz (68.1 kg)  09/23/23 160 lb (72.6 kg)  09/03/23 167 lb (75.8 kg)    Physical Exam Vitals reviewed.  Constitutional:      General: She is not in acute distress.    Appearance: Normal appearance. She is normal weight. She is not ill-appearing, toxic-appearing or diaphoretic.  HENT:     Head: Normocephalic.  Cardiovascular:     Rate and Rhythm: Normal rate.  Pulmonary:  Effort: Pulmonary effort is normal.  Musculoskeletal:        General: Normal range of motion.  Neurological:     General: No focal deficit present.     Mental Status: She is alert and oriented to person, place, and time. Mental status is at baseline.  Psychiatric:        Mood and Affect: Mood normal.        Behavior: Behavior normal.        Thought Content: Thought content normal.        Judgment: Judgment normal.          Results   Assessment & Plan:   Assessment and Plan Assessment & Plan Obesity Significant weight loss from 169 pounds in April to 150 pounds currently with tirzepatide  (Zepbound ). Off medication for a month due to insurance issues but resumed without side effects. Currently on 5  mg dose, plans to increase to 7.5 mg. Reports appetite suppression and possible inflammation reduction. Engages in spin classes twice a week and considering more strength training. - Send prescription for tirzepatide  7.5 mg to pharmacy and await prior authorization if required. - Encourage continuation of spin classes and incorporation of strength training. - Monitor weight and BMI to determine maintenance dose.  Essential hypertension Blood pressure has improved significantly with weight loss and tirzepatide , which may benefit cardiovascular risk factors.  General Health Maintenance Considering flu vaccination but prefers weekend scheduling due to potential post-vaccination symptoms. Aware of daughter's adverse reaction to meningitis shot but not concerned for herself. - Schedule flu shot for a weekend.  Recording duration: 7 minutes      Return in about 3 months (around 03/28/2024) for f/u weight loss medication.     Ginger Patrick, MSN, APRN, FNP-C [place] [facility]

## 2024-01-25 ENCOUNTER — Other Ambulatory Visit: Payer: Self-pay | Admitting: Family

## 2024-01-25 DIAGNOSIS — I10 Essential (primary) hypertension: Secondary | ICD-10-CM

## 2024-01-25 DIAGNOSIS — E663 Overweight: Secondary | ICD-10-CM

## 2024-01-25 DIAGNOSIS — E782 Mixed hyperlipidemia: Secondary | ICD-10-CM

## 2024-01-25 MED ORDER — ZEPBOUND 7.5 MG/0.5ML ~~LOC~~ SOAJ: 7.5000 mg | SUBCUTANEOUS | 0 refills | Status: DC

## 2024-03-14 ENCOUNTER — Other Ambulatory Visit: Payer: Self-pay

## 2024-03-14 ENCOUNTER — Ambulatory Visit: Admitting: Family

## 2024-03-14 VITALS — BP 118/74 | HR 84 | Temp 98.4°F | Ht 63.0 in | Wt 148.8 lb

## 2024-03-14 DIAGNOSIS — M542 Cervicalgia: Secondary | ICD-10-CM

## 2024-03-14 DIAGNOSIS — E782 Mixed hyperlipidemia: Secondary | ICD-10-CM

## 2024-03-14 DIAGNOSIS — I10 Essential (primary) hypertension: Secondary | ICD-10-CM

## 2024-03-14 DIAGNOSIS — G4452 New daily persistent headache (NDPH): Secondary | ICD-10-CM

## 2024-03-14 DIAGNOSIS — R29898 Other symptoms and signs involving the musculoskeletal system: Secondary | ICD-10-CM | POA: Insufficient documentation

## 2024-03-14 DIAGNOSIS — Z8669 Personal history of other diseases of the nervous system and sense organs: Secondary | ICD-10-CM | POA: Insufficient documentation

## 2024-03-14 DIAGNOSIS — R590 Localized enlarged lymph nodes: Secondary | ICD-10-CM

## 2024-03-14 DIAGNOSIS — E663 Overweight: Secondary | ICD-10-CM

## 2024-03-14 LAB — SEDIMENTATION RATE: Sed Rate: 3 mm/h (ref 0–20)

## 2024-03-14 LAB — CBC WITH DIFFERENTIAL/PLATELET
Basophils Absolute: 0 K/uL (ref 0.0–0.1)
Basophils Relative: 0.7 % (ref 0.0–3.0)
Eosinophils Absolute: 0 K/uL (ref 0.0–0.7)
Eosinophils Relative: 1 % (ref 0.0–5.0)
HCT: 41.3 % (ref 36.0–46.0)
Hemoglobin: 13.6 g/dL (ref 12.0–15.0)
Lymphocytes Relative: 44.6 % (ref 12.0–46.0)
Lymphs Abs: 2.3 K/uL (ref 0.7–4.0)
MCHC: 32.9 g/dL (ref 30.0–36.0)
MCV: 84.1 fl (ref 78.0–100.0)
Monocytes Absolute: 0.6 K/uL (ref 0.1–1.0)
Monocytes Relative: 10.5 % (ref 3.0–12.0)
Neutro Abs: 2.3 K/uL (ref 1.4–7.7)
Neutrophils Relative %: 43.2 % (ref 43.0–77.0)
Platelets: 299 K/uL (ref 150.0–400.0)
RBC: 4.92 Mil/uL (ref 3.87–5.11)
RDW: 13.8 % (ref 11.5–15.5)
WBC: 5.2 K/uL (ref 4.0–10.5)

## 2024-03-14 LAB — BASIC METABOLIC PANEL WITH GFR
BUN: 12 mg/dL (ref 6–23)
CO2: 29 meq/L (ref 19–32)
Calcium: 8.9 mg/dL (ref 8.4–10.5)
Chloride: 103 meq/L (ref 96–112)
Creatinine, Ser: 0.71 mg/dL (ref 0.40–1.20)
GFR: 105.22 mL/min (ref >60.00)
Glucose, Bld: 83 mg/dL (ref 70–99)
Potassium: 4.5 meq/L (ref 3.5–5.1)
Sodium: 137 meq/L (ref 135–145)

## 2024-03-14 LAB — C-REACTIVE PROTEIN: CRP: <0.5 mg/dL (ref 0.5–20.0)

## 2024-03-14 LAB — TSH: TSH: 0.98 u[IU]/mL (ref 0.35–5.50)

## 2024-03-14 MED ORDER — IBUPROFEN 600 MG PO TABS
600.0000 mg | ORAL_TABLET | Freq: Three times a day (TID) | ORAL | 0 refills | Status: AC | PRN
  Filled 2024-03-14: qty 30, 10d supply, fill #0

## 2024-03-14 MED ORDER — ZEPBOUND 7.5 MG/0.5ML ~~LOC~~ SOAJ
7.5000 mg | SUBCUTANEOUS | 0 refills | Status: DC
  Filled 2024-03-14 (×2): qty 2, 28d supply, fill #0

## 2024-03-14 MED ORDER — BUTALBITAL-APAP-CAFFEINE 50-325-40 MG PO TABS
1.0000 | ORAL_TABLET | Freq: Four times a day (QID) | ORAL | 0 refills | Status: AC | PRN
  Filled 2024-03-14: qty 14, 4d supply, fill #0

## 2024-03-14 MED ORDER — TIZANIDINE HCL 4 MG PO TABS
2.0000 mg | ORAL_TABLET | Freq: Every evening | ORAL | 0 refills | Status: AC | PRN
  Filled 2024-03-14: qty 15, 30d supply, fill #0
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
## 2024-03-15 ENCOUNTER — Ambulatory Visit: Payer: Self-pay | Admitting: Family

## 2024-03-16 ENCOUNTER — Other Ambulatory Visit (HOSPITAL_COMMUNITY): Payer: Self-pay

## 2024-03-17 ENCOUNTER — Other Ambulatory Visit (HOSPITAL_COMMUNITY): Payer: Self-pay

## 2024-03-17 ENCOUNTER — Telehealth: Payer: Self-pay

## 2024-03-17 NOTE — Telephone Encounter (Signed)
 Pharmacy Patient Advocate Encounter   Received notification from [employer] that prior authorization for Zepbound  7.5 is required/requested.   Insurance verification completed.   The patient is insured through [employer].   Per test claim: The current 28 day co-pay is, $24.99.  No PA needed at this time. This test claim was processed through [facility]- copay amounts may vary at other pharmacies due to pharmacy/plan contracts, or as the patient moves through the different stages of their insurance plan.     Patient has current approved PA that expires 06/19/24

## 2024-04-11 ENCOUNTER — Encounter: Payer: Self-pay | Admitting: Family

## 2024-04-11 NOTE — Telephone Encounter (Signed)
 This would be fine can be seen acutely

## 2024-04-29 ENCOUNTER — Encounter: Payer: Self-pay | Admitting: Family

## 2024-04-29 DIAGNOSIS — E663 Overweight: Secondary | ICD-10-CM

## 2024-04-29 DIAGNOSIS — I10 Essential (primary) hypertension: Secondary | ICD-10-CM

## 2024-04-29 DIAGNOSIS — E782 Mixed hyperlipidemia: Secondary | ICD-10-CM

## 2024-05-03 MED ORDER — ZEPBOUND 2.5 MG/0.5ML ~~LOC~~ SOAJ: 2.5000 mg | SUBCUTANEOUS | 2 refills | Status: AC

## 2024-05-11 ENCOUNTER — Ambulatory Visit: Payer: 59 | Admitting: Dermatology

## 2024-05-12 ENCOUNTER — Ambulatory Visit: Admitting: Dermatology
